# Patient Record
Sex: Female | Born: 1990 | Race: White | Hispanic: No | Marital: Single | Smoking: Never smoker
Health system: Southern US, Community
[De-identification: ages and names within clinical notes are randomized; demographics above are authoritative.]

## PROBLEM LIST (undated history)

## (undated) DIAGNOSIS — F419 Anxiety disorder, unspecified: Secondary | ICD-10-CM

## (undated) DIAGNOSIS — N926 Irregular menstruation, unspecified: Secondary | ICD-10-CM

## (undated) DIAGNOSIS — F319 Bipolar disorder, unspecified: Secondary | ICD-10-CM

## (undated) DIAGNOSIS — F329 Major depressive disorder, single episode, unspecified: Secondary | ICD-10-CM

## (undated) DIAGNOSIS — F32A Depression, unspecified: Secondary | ICD-10-CM

## (undated) HISTORY — DX: Irregular menstruation, unspecified: N92.6

## (undated) HISTORY — DX: Bipolar disorder, unspecified: F31.9

---
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
## 2008-10-06 ENCOUNTER — Ambulatory Visit: Payer: Self-pay | Admitting: Women's Health

## 2009-01-02 HISTORY — PX: KNEE SURGERY: SHX244

## 2009-01-13 ENCOUNTER — Ambulatory Visit: Payer: Self-pay | Admitting: Gynecology

## 2009-02-02 ENCOUNTER — Ambulatory Visit: Payer: Self-pay | Admitting: Vascular Surgery

## 2009-10-11 ENCOUNTER — Other Ambulatory Visit: Admission: RE | Admit: 2009-10-11 | Discharge: 2009-10-11 | Payer: Self-pay | Admitting: Gynecology

## 2009-10-11 ENCOUNTER — Ambulatory Visit: Payer: Self-pay | Admitting: Women's Health

## 2009-11-24 ENCOUNTER — Ambulatory Visit: Payer: Self-pay | Admitting: Women's Health

## 2009-11-29 ENCOUNTER — Ambulatory Visit: Payer: Self-pay | Admitting: Women's Health

## 2010-02-16 ENCOUNTER — Ambulatory Visit: Payer: Self-pay | Admitting: Women's Health

## 2010-05-16 ENCOUNTER — Ambulatory Visit (INDEPENDENT_AMBULATORY_CARE_PROVIDER_SITE_OTHER): Payer: BC Managed Care – PPO

## 2010-05-16 DIAGNOSIS — Z3049 Encounter for surveillance of other contraceptives: Secondary | ICD-10-CM

## 2010-07-17 NOTE — Procedures (Signed)
DUPLEX DEEP VENOUS EXAM - LOWER EXTREMITY   INDICATION:  Right lower extremity pain.   HISTORY:  Edema:  Yes.  Trauma/Surgery:  Yes.  Pain:  Yes.  PE:  No.  Previous DVT:  No.  Anticoagulants:  None.  Other:   DUPLEX EXAM:                CFV   SFV   PopV  PTV    GSV                R  L  R  L  R  L  R   L  R  L  Thrombosis    o  o  o     o     o      o  Spontaneous   +  +  +     +     +      +  Phasic        +  +  +     +     +      +  Augmentation  +  +  +     +     +      +  Compressible  +  +  +     +     +      +  Competent     +  +  +     +     +      +   Legend:  + - yes  o - no  p - partial  D - decreased   IMPRESSION:  No evidence of deep venous thrombosis noted in the right  leg.   Notified Jacki Cones with results.    _____________________________  Di Kindle. Edilia Bo, M.D.   MG/MEDQ  D:  02/02/2009  T:  02/02/2009  Job:  [id number]

## 2010-08-09 ENCOUNTER — Ambulatory Visit (INDEPENDENT_AMBULATORY_CARE_PROVIDER_SITE_OTHER): Payer: BC Managed Care – PPO

## 2010-08-09 DIAGNOSIS — Z3049 Encounter for surveillance of other contraceptives: Secondary | ICD-10-CM

## 2010-10-18 DIAGNOSIS — N926 Irregular menstruation, unspecified: Secondary | ICD-10-CM | POA: Insufficient documentation

## 2010-10-26 ENCOUNTER — Encounter: Payer: Self-pay | Admitting: Women's Health

## 2010-10-26 ENCOUNTER — Ambulatory Visit (INDEPENDENT_AMBULATORY_CARE_PROVIDER_SITE_OTHER): Payer: BC Managed Care – PPO | Admitting: Women's Health

## 2010-10-26 ENCOUNTER — Other Ambulatory Visit (HOSPITAL_COMMUNITY)
Admission: RE | Admit: 2010-10-26 | Discharge: 2010-10-26 | Disposition: A | Discharge status: Home / Self Care | Payer: BC Managed Care – PPO | Source: Ambulatory Visit | Attending: Gynecology | Admitting: Gynecology

## 2010-10-26 VITALS — BP 120/70 | Ht 67.5 in | Wt 193.0 lb

## 2010-10-26 DIAGNOSIS — Z01419 Encounter for gynecological examination (general) (routine) without abnormal findings: Secondary | ICD-10-CM

## 2010-10-26 DIAGNOSIS — Z309 Encounter for contraceptive management, unspecified: Secondary | ICD-10-CM

## 2010-10-26 DIAGNOSIS — Z113 Encounter for screening for infections with a predominantly sexual mode of transmission: Secondary | ICD-10-CM

## 2010-10-26 MED ORDER — NORGESTIM-ETH ESTRAD TRIPHASIC 0.18/0.215/0.25 MG-35 MCG PO TABS: 1.0000 | ORAL_TABLET | Freq: Every day | ORAL | Status: DC

## 2010-10-26 NOTE — Progress Notes (Signed)
Tara Sullivan 1991/01/05 [id number]    History:    The patient presents for annual exam.    Past medical history, past surgical history, family history and social history were all reviewed and documented in the EPIC chart.   ROS:  A  ROS was performed and pertinent positives and negatives are included in the history.  Exam:  Filed Vitals:   10/26/10 0914  BP: 120/70    General appearance:  Normal Head/Neck:  Normal, without cervical or supraclavicular adenopathy. Thyroid:  Symmetrical, normal in size, without palpable masses or nodularity. Respiratory  Effort:  Normal  Auscultation:  Clear without wheezing or rhonchi Cardiovascular  Auscultation:  Regular rate, without rubs, murmurs or gallops  Edema/varicosities:  Not grossly evident Abdominal  Soft,nontender, without masses, guarding or rebound.  Liver/spleen:  No organomegaly noted  Hernia:  None appreciated  Skin  Inspection:  Grossly normal  Palpation:  Grossly normal Neurologic/psychiatric  Orientation:  Normal with appropriate conversation.  Mood/affect:  Normal  Genitourinary    Breasts: Examined lying and sitting.     Right: Without masses, retractions, discharge or axillary adenopathy.     Left: Without masses, retractions, discharge or axillary adenopathy.   Inguinal/mons:  Normal without inguinal adenopathy  External genitalia:  Normal  BUS/Urethra/Skene's glands:  Normal  Bladder:  Normal  Vagina:  Normal  Cervix:  Normal  Uterus:  normal in size, shape and contour.  Midline and mobile  Adnexa/parametria:     Rt: Without masses or tenderness.   Lt: Without masses or tenderness.  Anus and perineum: Normal  Digital rectal exam: Normal sphincter tone without palpated masses or tenderness  Assessment/Plan:  20 y.o.SWF G0  for annual exam.   Presents for an annual exam being on DepoProvera for 1 year, amenorrhic. She has gained 40 pounds, states is hungry all the time which she accounts for her weight  gain. Other options were reviewed. Shes been on pills without a problem except for forgetting, did review ways to remember and would like to start back on pills. Will start back on today when she picks up her prescription because her depo was due this week. Did review importance of condoms for infection control and first month back on. Did review slight risk for blood clots and strokes Weight Watchers encouraged for weight , increasing her exercises well. SBEs, MVI, healthy diet encouraged, driving and campus safety reviewed. CBC, UA, Pap, GC/ Chlamydia, declines need for , hepatitis and RPR.Marland Kitchen  Has had some lower abdominal  Pain, intermittent, did review if this is not decrease or resolve to return to the office for an ultrasound. States it has only been for a few days. Denies any constipation or UTI symptoms, denies discharge.    Harrington [facility], 9:42 AM 10/26/2010

## 2010-12-21 ENCOUNTER — Other Ambulatory Visit: Payer: Self-pay | Admitting: *Deleted

## 2010-12-21 MED ORDER — NORGESTIM-ETH ESTRAD TRIPHASIC 0.18/0.215/0.25 MG-35 MCG PO TABS: 1.0000 | ORAL_TABLET | Freq: Every day | ORAL | Status: DC

## 2010-12-26 MED ORDER — NORGESTIM-ETH ESTRAD TRIPHASIC 0.18/0.215/0.25 MG-35 MCG PO TABS: 1.0000 | ORAL_TABLET | Freq: Every day | ORAL | Status: DC

## 2010-12-26 NOTE — Telephone Encounter (Signed)
Addended byMckinley Jewel, Richrd Kuzniar L on: 12/26/2010 11:13 AM   Modules accepted: Orders

## 2011-06-11 ENCOUNTER — Other Ambulatory Visit: Payer: Self-pay | Admitting: Gynecology

## 2011-06-11 ENCOUNTER — Encounter: Payer: Self-pay | Admitting: Gynecology

## 2011-06-11 ENCOUNTER — Ambulatory Visit (INDEPENDENT_AMBULATORY_CARE_PROVIDER_SITE_OTHER): Payer: BC Managed Care – PPO | Admitting: Gynecology

## 2011-06-11 VITALS — BP 118/78

## 2011-06-11 DIAGNOSIS — R300 Dysuria: Secondary | ICD-10-CM

## 2011-06-11 DIAGNOSIS — N898 Other specified noninflammatory disorders of vagina: Secondary | ICD-10-CM

## 2011-06-11 DIAGNOSIS — N390 Urinary tract infection, site not specified: Secondary | ICD-10-CM

## 2011-06-11 LAB — URINALYSIS W MICROSCOPIC + REFLEX CULTURE
Glucose, UA: 100 mg/dL — AB
Nitrite: POSITIVE — AB
Protein, ur: 30 mg/dL — AB
Urobilinogen, UA: 2 mg/dL — ABNORMAL HIGH (ref 0.0–1.0)

## 2011-06-11 LAB — WET PREP FOR TRICH, YEAST, CLUE
Clue Cells Wet Prep HPF POC: NONE SEEN
Trich, Wet Prep: NONE SEEN

## 2011-06-11 MED ORDER — NITROFURANTOIN MONOHYD MACRO 100 MG PO CAPS: 100.0000 mg | ORAL_CAPSULE | Freq: Two times a day (BID) | ORAL | Status: DC

## 2011-06-11 NOTE — Progress Notes (Signed)
Patient presented to the office today complaining of 2 days of dysuria and frequency and this past Sunday had noted some blood in her urine. She tried yesterday over-the-counter Azo with minimal relief of her symptoms. She felt a little warm but no temperature reading reported some mild nausea one day but no chills or vomiting. She is on the oral contraceptive pills and is having normal menstrual cycles. No past history of any kidney stones and last urinary tract infection was a few years ago.  Exam: Abdomen soft  Tender suprapubically Pelvic: Bartholin urethra Skene was within normal limits Vagina: No lesions or discharge Cervix: No lesion discharge uterus: Anteverted normal size shape and consistency tender suprapubically Adnexa: No palpable masses or tenderness Rectal: Not examined  Urinalysis: 3-6 WBC, 36 RBC, few bacteria. Glucose 100 mg/dl  Assessment/plan: Signs and symptoms consistent with urinary tract infection. Urine culture will be started but meanwhile we'll start patient on Macrobid one by mouth twice a day for 7 days. She will be prescribed Uribell as an antispasmodic agent and she will take one 3-4 times a day for the next 2-3 days. She was instructed to increase her fluid intake as well. If she has any flank pain or fever she should report to the office immediately. I would like her to return to the office after completion of the above treatment so we can check her urine and make sure she still no spilling sugar. Of note the wet prep demonstrated no abnormalities.

## 2011-06-11 NOTE — Patient Instructions (Signed)

## 2011-06-12 ENCOUNTER — Telehealth: Payer: Self-pay | Admitting: *Deleted

## 2011-06-12 DIAGNOSIS — N390 Urinary tract infection, site not specified: Secondary | ICD-10-CM

## 2011-06-12 DIAGNOSIS — R300 Dysuria: Secondary | ICD-10-CM

## 2011-06-12 MED ORDER — NITROFURANTOIN MONOHYD MACRO 100 MG PO CAPS: 100.0000 mg | ORAL_CAPSULE | Freq: Two times a day (BID) | ORAL | Status: AC

## 2011-06-12 NOTE — Telephone Encounter (Signed)
Message copied by Aura Camps on Wed Jun 12, 2011 11:18 AM ------      Message from: Ok Edwards      Created: Tue Jun 11, 2011  5:20 PM       Victorino Dike, please call patient and tell her that after she finishes the antibiotic treatment I want her to come by the office a week and repeat her urinalysis not only to see that her infection has been cleared but also to make sure she does not continue to spill sugar as was noted on this recent urinalysis.

## 2011-06-12 NOTE — Telephone Encounter (Signed)
Pt informed with the below note, order place in computer, pt also said that pharmacy never got the macrobid rx, this will be sent as well.

## 2011-06-13 LAB — URINE CULTURE: Organism ID, Bacteria: NO GROWTH

## 2011-11-13 ENCOUNTER — Ambulatory Visit (INDEPENDENT_AMBULATORY_CARE_PROVIDER_SITE_OTHER): Payer: BC Managed Care – PPO | Admitting: Women's Health

## 2011-11-13 ENCOUNTER — Encounter: Payer: Self-pay | Admitting: Women's Health

## 2011-11-13 VITALS — BP 126/74 | Ht 67.0 in | Wt 201.0 lb

## 2011-11-13 DIAGNOSIS — Z309 Encounter for contraceptive management, unspecified: Secondary | ICD-10-CM

## 2011-11-13 DIAGNOSIS — Z113 Encounter for screening for infections with a predominantly sexual mode of transmission: Secondary | ICD-10-CM

## 2011-11-13 DIAGNOSIS — Z8619 Personal history of other infectious and parasitic diseases: Secondary | ICD-10-CM | POA: Insufficient documentation

## 2011-11-13 DIAGNOSIS — A64 Unspecified sexually transmitted disease: Secondary | ICD-10-CM

## 2011-11-13 DIAGNOSIS — Z01419 Encounter for gynecological examination (general) (routine) without abnormal findings: Secondary | ICD-10-CM

## 2011-11-13 DIAGNOSIS — IMO0001 Reserved for inherently not codable concepts without codable children: Secondary | ICD-10-CM

## 2011-11-13 LAB — CBC WITH DIFFERENTIAL/PLATELET
Eosinophils Absolute: 0.1 10*3/uL (ref 0.0–0.7)
Eosinophils Relative: 1 % (ref 0–5)
Lymphs Abs: 2.3 10*3/uL (ref 0.7–4.0)
MCH: 29.9 pg (ref 26.0–34.0)
MCHC: 33.2 g/dL (ref 30.0–36.0)
MCV: 90.2 fL (ref 78.0–100.0)
Monocytes Relative: 9 % (ref 3–12)
Platelets: 260 10*3/uL (ref 150–400)
RBC: 4.41 MIL/uL (ref 3.87–5.11)

## 2011-11-13 MED ORDER — NORGESTIMATE-ETH ESTRADIOL 0.25-35 MG-MCG PO TABS: 1.0000 | ORAL_TABLET | Freq: Every day | ORAL | Status: DC

## 2011-11-13 NOTE — Progress Notes (Signed)
Tara Sullivan 1991/01/16 [id number]    History:    The patient presents for annual exam.  On Ortho Tri-Cyclen, spotting first few days of placebo week then normal flow for several days. Questionable exposure to an STD. History of Chlamydia 2009 in 2010/treated. Gardasil series completed in 2007. History of normal Paps.  Past medical history, past surgical history, family history and social history were all reviewed and documented in the EPIC chart. Works at [employer].   ROS:  A  ROS was performed and pertinent positives and negatives are included in the history.  Exam:  Filed Vitals:   11/13/11 0928  BP: 126/74    General appearance:  Normal Head/Neck:  Normal, without cervical or supraclavicular adenopathy. Thyroid:  Symmetrical, normal in size, without palpable masses or nodularity. Respiratory  Effort:  Normal  Auscultation:  Clear without wheezing or rhonchi Cardiovascular  Auscultation:  Regular rate, without rubs, murmurs or gallops  Edema/varicosities:  Not grossly evident Abdominal  Soft,nontender, without masses, guarding or rebound.  Liver/spleen:  No organomegaly noted  Hernia:  None appreciated  Skin  Inspection:  Grossly normal  Palpation:  Grossly normal Neurologic/psychiatric  Orientation:  Normal with appropriate conversation.  Mood/affect:  Normal  Genitourinary    Breasts: Examined lying and sitting.     Right: Without masses, retractions, discharge or axillary adenopathy.     Left: Without masses, retractions, discharge or axillary adenopathy.   Inguinal/mons:  Normal without inguinal adenopathy  External genitalia:  Normal  BUS/Urethra/Skene's glands:  Normal  Bladder:  Normal  Vagina:  Normal  Cervix:  Normal  Uterus:   normal in size, shape and contour.  Midline and mobile  Adnexa/parametria:     Rt: Without masses or tenderness.   Lt: Without masses or tenderness.  Anus and  perineum: Normal    Assessment/Plan:  21 y.o. S WF G0 for annual exam with no complaints.  Normal GYN exam STD screen Obesity  Plan: Will try Ortho-Cyclen, monophasic to hopefully shortened cycle. Instructed to call if cycle continues to last 6 days. Condoms encouraged until permanent partner. SBE's, exercise, calcium rich diet, MVI daily, campus safety reviewed. Reviewed importance of decreasing calories and increasing exercise for weight loss, Weight Watchers reviewed. CBC, UA, GC/Chlamydia, HIV, hep B, C., RPR. No Pap history of normal Paps, new screening guidelines reviewed.    Harrington Challenger [facility], 12:07 PM 11/13/2011

## 2011-11-13 NOTE — Patient Instructions (Addendum)

## 2011-11-14 LAB — RPR

## 2012-01-01 ENCOUNTER — Telehealth: Payer: Self-pay | Admitting: Women's Health

## 2012-01-01 ENCOUNTER — Other Ambulatory Visit: Payer: Self-pay | Admitting: Women's Health

## 2012-01-01 DIAGNOSIS — IMO0001 Reserved for inherently not codable concepts without codable children: Secondary | ICD-10-CM

## 2012-01-01 MED ORDER — NORGESTIMATE-ETH ESTRADIOL 0.25-35 MG-MCG PO TABS: 1.0000 | ORAL_TABLET | Freq: Every day | ORAL | Status: DC

## 2012-01-01 NOTE — Telephone Encounter (Signed)
I called patient because her pharmacy sent refill request for Ortho Tri Cyclen.  However, at last office visit [place] discussed with her switching to OrthoCyclen to improve her cycle.  Patient said she did want to start the OrthoCyclen.  We realized [place] sent the [facility] to [facility] and patient actually has mail order. I redirected the OrthoCyclen to [facility] and denied the refill on the Ortho Tri Cyclen.

## 2012-03-13 ENCOUNTER — Other Ambulatory Visit: Payer: Self-pay | Admitting: Family Medicine

## 2012-03-13 DIAGNOSIS — R51 Headache: Secondary | ICD-10-CM

## 2012-03-16 ENCOUNTER — Ambulatory Visit
Admission: RE | Admit: 2012-03-16 | Discharge: 2012-03-16 | Disposition: A | Discharge status: Home / Self Care | Payer: BC Managed Care – PPO | Source: Ambulatory Visit | Attending: Family Medicine | Admitting: Family Medicine

## 2012-03-16 DIAGNOSIS — R51 Headache: Secondary | ICD-10-CM

## 2012-05-04 ENCOUNTER — Other Ambulatory Visit: Payer: Self-pay

## 2012-05-04 DIAGNOSIS — IMO0001 Reserved for inherently not codable concepts without codable children: Secondary | ICD-10-CM

## 2012-05-04 MED ORDER — NORGESTIMATE-ETH ESTRADIOL 0.25-35 MG-MCG PO TABS: 1.0000 | ORAL_TABLET | Freq: Every day | ORAL | Status: DC

## 2012-10-04 ENCOUNTER — Other Ambulatory Visit: Payer: Self-pay | Admitting: Women's Health

## 2012-10-05 NOTE — Telephone Encounter (Signed)
Has CE scheduled in September 2014.

## 2012-11-13 ENCOUNTER — Other Ambulatory Visit (HOSPITAL_COMMUNITY)
Admission: RE | Admit: 2012-11-13 | Discharge: 2012-11-13 | Disposition: A | Discharge status: Home / Self Care | Payer: BC Managed Care – PPO | Source: Ambulatory Visit | Attending: Gynecology | Admitting: Gynecology

## 2012-11-13 ENCOUNTER — Encounter: Payer: Self-pay | Admitting: Women's Health

## 2012-11-13 ENCOUNTER — Ambulatory Visit (INDEPENDENT_AMBULATORY_CARE_PROVIDER_SITE_OTHER): Payer: BC Managed Care – PPO | Admitting: Women's Health

## 2012-11-13 VITALS — BP 116/74 | Ht 67.5 in | Wt 196.2 lb

## 2012-11-13 DIAGNOSIS — Z113 Encounter for screening for infections with a predominantly sexual mode of transmission: Secondary | ICD-10-CM

## 2012-11-13 DIAGNOSIS — Z01419 Encounter for gynecological examination (general) (routine) without abnormal findings: Secondary | ICD-10-CM

## 2012-11-13 DIAGNOSIS — Z309 Encounter for contraceptive management, unspecified: Secondary | ICD-10-CM

## 2012-11-13 DIAGNOSIS — IMO0001 Reserved for inherently not codable concepts without codable children: Secondary | ICD-10-CM

## 2012-11-13 LAB — CBC WITH DIFFERENTIAL/PLATELET
Basophils Absolute: 0 10*3/uL (ref 0.0–0.1)
Basophils Relative: 0 % (ref 0–1)
HCT: 37.9 % (ref 36.0–46.0)
MCHC: 34 g/dL (ref 30.0–36.0)
Monocytes Absolute: 0.7 10*3/uL (ref 0.1–1.0)
Neutro Abs: 4.5 10*3/uL (ref 1.7–7.7)
Platelets: 285 10*3/uL (ref 150–400)
RDW: 12.5 % (ref 11.5–15.5)
WBC: 7.1 10*3/uL (ref 4.0–10.5)

## 2012-11-13 LAB — HEPATITIS C ANTIBODY: HCV Ab: NEGATIVE

## 2012-11-13 LAB — HEPATITIS B SURFACE ANTIGEN: Hepatitis B Surface Ag: NEGATIVE

## 2012-11-13 LAB — RPR

## 2012-11-13 MED ORDER — ETONOGESTREL-ETHINYL ESTRADIOL 0.12-0.015 MG/24HR VA RING: VAGINAL_RING | VAGINAL | Status: DC

## 2012-11-13 NOTE — Patient Instructions (Signed)

## 2012-11-13 NOTE — Progress Notes (Signed)
Tara Sullivan [birth year] [id number]    History:    The patient presents for annual exam with concern about dysmenorrhea/menorrhagia. Describes periods as lasting 1 week and heavy for 4 days. Also describes dyspareunia with certain positions that causes light spotting. Sprintec/new partner, normal PAP history. History of Chlamydia in 2009/20010.     Past medical history, past surgical history, family history and social history were all reviewed and documented in the EPIC chart. MGF HTN DM, MGM HTN. Works at [employer], thinking about nursing school, lives with parents. Been with current partner 1 year.    ROS:  A  ROS was performed and pertinent positives and negatives are included in the history.  Exam:  Filed Vitals:   11/13/12 1007  BP: 116/74    General appearance:  Normal Head/Neck:  Normal, without cervical or supraclavicular adenopathy. Thyroid:  Symmetrical, normal in size, without palpable masses or nodularity. Respiratory  Effort:  Normal  Auscultation:  Clear without wheezing or rhonchi Cardiovascular  Auscultation:  Regular rate, without rubs, murmurs or gallops  Edema/varicosities:  Not grossly evident Abdominal  Soft,nontender, without masses, guarding or rebound.  Liver/spleen:  No organomegaly noted  Hernia:  None appreciated  Skin  Inspection:  Grossly normal  Palpation:  Grossly normal Neurologic/psychiatric  Orientation:  Normal with appropriate conversation.  Mood/affect:  Normal  Genitourinary    Breasts: Examined lying and sitting.     Right: Without masses, retractions, discharge or axillary adenopathy.     Left: Without masses, retractions, discharge or axillary adenopathy.   Inguinal/mons:  Normal without inguinal adenopathy  External genitalia:  Normal  BUS/Urethra/Skene's glands:  Normal  Bladder:  Normal  Vagina:  Normal  Cervix:  Normal  Uterus:  normal in size, shape and contour.  Midline and mobile  Adnexa/parametria:      Rt: Without masses or tenderness.   Lt: Without masses or tenderness.  Anus and perineum: Normal  Digital rectal exam: Normal sphincter tone without palpated masses or tenderness  Assessment/Plan:  22 y.o.  SWF, G0 for annual exam.    Normal GYN exam STI screening Contraception Management Obesity  Plan: Contraception options reviewed, nuvaring prescription, proper use given and reviewed slight risk for blood clots and strokes, instructed to call if no relief of dysmenorrhea and menorrhagia. Encouraged to avoid certain positions that caused painful intercourse., PAP, normal 2012, new screening guidelines reviewed. GC/Chlamydia, HIV, hep B, C., RPR. UA, CBC, encouraged watching calorie intake and exercise for weight loss, SBEs, MVI daily encouraged.     Harrington Challenger [facility], 10:41 AM 11/13/2012

## 2012-11-14 LAB — URINALYSIS W MICROSCOPIC + REFLEX CULTURE
Bilirubin Urine: NEGATIVE
Crystals: NONE SEEN
Leukocytes, UA: NEGATIVE
Nitrite: NEGATIVE
Protein, ur: NEGATIVE mg/dL
Specific Gravity, Urine: 1.019 (ref 1.005–1.030)
Squamous Epithelial / LPF: NONE SEEN
Urobilinogen, UA: 0.2 mg/dL (ref 0.0–1.0)

## 2012-11-15 LAB — GC/CHLAMYDIA PROBE AMP: GC Probe RNA: NEGATIVE

## 2012-11-17 ENCOUNTER — Other Ambulatory Visit: Payer: Self-pay | Admitting: Women's Health

## 2012-11-17 MED ORDER — AZITHROMYCIN 250 MG PO TABS: 500.0000 mg | ORAL_TABLET | Freq: Once | ORAL | Status: DC

## 2012-11-17 MED ORDER — AZITHROMYCIN 500 MG PO TABS: 1000.0000 mg | ORAL_TABLET | Freq: Once | ORAL | Status: DC

## 2012-12-08 ENCOUNTER — Ambulatory Visit (INDEPENDENT_AMBULATORY_CARE_PROVIDER_SITE_OTHER): Payer: BC Managed Care – PPO | Admitting: Women's Health

## 2012-12-08 ENCOUNTER — Encounter: Payer: Self-pay | Admitting: Women's Health

## 2012-12-08 DIAGNOSIS — A64 Unspecified sexually transmitted disease: Secondary | ICD-10-CM

## 2012-12-08 DIAGNOSIS — Z113 Encounter for screening for infections with a predominantly sexual mode of transmission: Secondary | ICD-10-CM

## 2012-12-08 NOTE — Patient Instructions (Addendum)

## 2012-12-08 NOTE — Progress Notes (Signed)
Patient ID: Tara Sullivan, female   DOB: 1991/02/04, 22 y.o.   MRN: [id number]  Presents for TOC, positive for asymptomatic chlamydia on annual exam, negative HIV, RPR hep b,c.  Treated with azithromycin, partner  treated and has abstained.    Exam:  Speculum exam no disharge, erythema, GC/Chylamydia culture taken.  Plan: GC/chlamydia culture pending.  Continue NuvaRing and condoms.

## 2012-12-09 LAB — GC/CHLAMYDIA PROBE AMP: CT Probe RNA: NEGATIVE

## 2013-04-01 ENCOUNTER — Ambulatory Visit (INDEPENDENT_AMBULATORY_CARE_PROVIDER_SITE_OTHER): Payer: BC Managed Care – PPO | Admitting: Women's Health

## 2013-04-01 ENCOUNTER — Encounter: Payer: Self-pay | Admitting: Women's Health

## 2013-04-01 ENCOUNTER — Other Ambulatory Visit: Payer: Self-pay | Admitting: Women's Health

## 2013-04-01 DIAGNOSIS — Z113 Encounter for screening for infections with a predominantly sexual mode of transmission: Secondary | ICD-10-CM

## 2013-04-01 DIAGNOSIS — N390 Urinary tract infection, site not specified: Secondary | ICD-10-CM

## 2013-04-01 DIAGNOSIS — R300 Dysuria: Secondary | ICD-10-CM

## 2013-04-01 DIAGNOSIS — N898 Other specified noninflammatory disorders of vagina: Secondary | ICD-10-CM

## 2013-04-01 LAB — URINALYSIS W MICROSCOPIC + REFLEX CULTURE
Bilirubin Urine: NEGATIVE
Crystals: NONE SEEN
Glucose, UA: NEGATIVE mg/dL
Ketones, ur: NEGATIVE mg/dL
NITRITE: POSITIVE — AB
PH: 5.5 (ref 5.0–8.0)
Protein, ur: NEGATIVE mg/dL
SPECIFIC GRAVITY, URINE: 1.025 (ref 1.005–1.030)
UROBILINOGEN UA: 0.2 mg/dL (ref 0.0–1.0)

## 2013-04-01 LAB — WET PREP FOR TRICH, YEAST, CLUE
CLUE CELLS WET PREP: NONE SEEN
Trich, Wet Prep: NONE SEEN
YEAST WET PREP: NONE SEEN

## 2013-04-01 MED ORDER — SULFAMETHOXAZOLE-TMP DS 800-160 MG PO TABS: 1.0000 | ORAL_TABLET | Freq: Two times a day (BID) | ORAL | Status: DC

## 2013-04-01 NOTE — Progress Notes (Signed)
Patient ID: Tara Sullivan, female   DOB: 08/08/1990, 23 y.o.   MRN: [id number] Presents with complaint of increased urinary frequency, pain at end of stream of urination for 1 week at symptoms are progressing. Denies vaginal discharge, abdominal pain or fever. Having low back pain. Had unprotected intercourse with new partner. Contraceptives with NuvaRing.   Exam: Appears well. No CVAT pain is more in the sacral area. External genitalia within normal limits, speculum exam scant discharge, wet prep negative. GC/Chlamydia culture taken. Bimanual no CMT or adnexal fullness or tenderness. UA: Positive nitrites, small leukocytes, TNTC wbc's, many bacteria.  UTI STD screen  Plan: Septra twice daily for 3 days, prescription, proper use given and reviewed. Instructed to call if no relief of symptoms. UTI prevention discussed. GC/Chlamydia culture pending. Declines HIV, hepatitis or RPR. Reviewed importance of condoms if sexually active.

## 2013-04-01 NOTE — Patient Instructions (Signed)
Urinary Tract Infection  Urinary tract infections (UTIs) can develop anywhere along your urinary tract. Your urinary tract is your body's drainage system for removing wastes and extra water. Your urinary tract includes two kidneys, two ureters, a bladder, and a urethra. Your kidneys are a pair of bean-shaped organs. Each kidney is about the size of your fist. They are located below your ribs, one on each side of your spine.  CAUSES  Infections are caused by microbes, which are microscopic organisms, including fungi, viruses, and bacteria. These organisms are so small that they can only be seen through a microscope. Bacteria are the microbes that most commonly cause UTIs.  SYMPTOMS   Symptoms of UTIs may vary by age and gender of the patient and by the location of the infection. Symptoms in young women typically include a frequent and intense urge to urinate and a painful, burning feeling in the bladder or urethra during urination. Older women and men are more likely to be tired, shaky, and weak and have muscle aches and abdominal pain. A fever may mean the infection is in your kidneys. Other symptoms of a kidney infection include pain in your back or sides below the ribs, nausea, and vomiting.  DIAGNOSIS  To diagnose a UTI, your caregiver will ask you about your symptoms. Your caregiver also will ask to provide a urine sample. The urine sample will be tested for bacteria and white blood cells. White blood cells are made by your body to help fight infection.  TREATMENT   Typically, UTIs can be treated with medication. Because most UTIs are caused by a bacterial infection, they usually can be treated with the use of antibiotics. The choice of antibiotic and length of treatment depend on your symptoms and the type of bacteria causing your infection.  HOME CARE INSTRUCTIONS   If you were prescribed antibiotics, take them exactly as your caregiver instructs you. Finish the medication even if you feel better after you  have only taken some of the medication.   Drink enough water and fluids to keep your urine clear or pale yellow.   Avoid caffeine, tea, and carbonated beverages. They tend to irritate your bladder.   Empty your bladder often. Avoid holding urine for long periods of time.   Empty your bladder before and after sexual intercourse.   After a bowel movement, women should cleanse from front to back. Use each tissue only once.  SEEK MEDICAL CARE IF:    You have back pain.   You develop a fever.   Your symptoms do not begin to resolve within 3 days.  SEEK IMMEDIATE MEDICAL CARE IF:    You have severe back pain or lower abdominal pain.   You develop chills.   You have nausea or vomiting.   You have continued burning or discomfort with urination.  MAKE SURE YOU:    Understand these instructions.   Will watch your condition.   Will get help right away if you are not doing well or get worse.  Document Released: 11/28/2004 Document Revised: 08/20/2011 Document Reviewed: 03/29/2011  ExitCare Patient Information 2014 [employer].

## 2013-04-02 ENCOUNTER — Other Ambulatory Visit: Payer: Self-pay | Admitting: Women's Health

## 2013-04-02 DIAGNOSIS — Z113 Encounter for screening for infections with a predominantly sexual mode of transmission: Secondary | ICD-10-CM

## 2013-04-02 LAB — GC/CHLAMYDIA PROBE AMP
CT PROBE, AMP APTIMA: POSITIVE — AB
GC PROBE AMP APTIMA: NEGATIVE

## 2013-04-02 MED ORDER — AZITHROMYCIN 500 MG PO TABS: 1000.0000 mg | ORAL_TABLET | Freq: Once | ORAL | Status: DC

## 2013-04-04 LAB — URINE CULTURE: Colony Count: >=100000

## 2013-04-20 ENCOUNTER — Ambulatory Visit: Payer: BC Managed Care – PPO | Admitting: Women's Health

## 2013-04-29 ENCOUNTER — Ambulatory Visit: Payer: BC Managed Care – PPO | Admitting: Women's Health

## 2013-05-07 ENCOUNTER — Ambulatory Visit (INDEPENDENT_AMBULATORY_CARE_PROVIDER_SITE_OTHER): Payer: BC Managed Care – PPO | Admitting: Women's Health

## 2013-05-07 ENCOUNTER — Encounter: Payer: Self-pay | Admitting: Women's Health

## 2013-05-07 DIAGNOSIS — Z113 Encounter for screening for infections with a predominantly sexual mode of transmission: Secondary | ICD-10-CM

## 2013-05-07 DIAGNOSIS — B373 Candidiasis of vulva and vagina: Secondary | ICD-10-CM

## 2013-05-07 DIAGNOSIS — A64 Unspecified sexually transmitted disease: Secondary | ICD-10-CM

## 2013-05-07 DIAGNOSIS — B3731 Acute candidiasis of vulva and vagina: Secondary | ICD-10-CM

## 2013-05-07 LAB — WET PREP FOR TRICH, YEAST, CLUE
CLUE CELLS WET PREP: NONE SEEN
Trich, Wet Prep: NONE SEEN
Yeast Wet Prep HPF POC: NONE SEEN

## 2013-05-07 MED ORDER — FLUCONAZOLE 150 MG PO TABS
150.0000 mg | ORAL_TABLET | Freq: Once | ORAL | Status: DC
Start: 2013-05-07 — End: 2013-10-27

## 2013-05-07 NOTE — Progress Notes (Signed)
Patient ID: Tara Sullivan, female   DOB: 06/30/1990, 23 y.o.   MRN: [id number] Presents for a test of cure Chlamydia. States has been sexually active since treatment. Having scant discharge with mild itching. Denies urinary symptoms, abdominal pain or fever. Monthly cycle on nuva ring.   Exam: Appears well. External genitalia within normal limits, speculum exam scant white curdy discharge noted wet prep negative. GC/Chlamydia culture taken.   Clinical yeast  STD screen/test of cure  Plan: Diflucan 150 by mouth x1 dose prescription, proper use given and reviewed. GC/Chlamydia culture pending. Reviewed importance of condoms. HIV, hep B, C., RPR.

## 2013-05-08 LAB — HIV ANTIBODY (ROUTINE TESTING W REFLEX): HIV: NONREACTIVE

## 2013-05-08 LAB — GC/CHLAMYDIA PROBE AMP
CT Probe RNA: NEGATIVE
GC Probe RNA: NEGATIVE

## 2013-05-08 LAB — RPR

## 2013-05-08 LAB — HEPATITIS C ANTIBODY: HCV Ab: NEGATIVE

## 2013-05-08 LAB — HEPATITIS B SURFACE ANTIGEN: Hepatitis B Surface Ag: NEGATIVE

## 2013-05-10 ENCOUNTER — Encounter: Payer: Self-pay | Admitting: Women's Health

## 2013-07-29 ENCOUNTER — Other Ambulatory Visit: Payer: Self-pay | Admitting: Gastroenterology

## 2013-10-27 ENCOUNTER — Encounter: Payer: Self-pay | Admitting: Women's Health

## 2013-10-27 ENCOUNTER — Ambulatory Visit (INDEPENDENT_AMBULATORY_CARE_PROVIDER_SITE_OTHER): Payer: BC Managed Care – PPO | Admitting: Women's Health

## 2013-10-27 DIAGNOSIS — B373 Candidiasis of vulva and vagina: Secondary | ICD-10-CM

## 2013-10-27 DIAGNOSIS — B9689 Other specified bacterial agents as the cause of diseases classified elsewhere: Secondary | ICD-10-CM

## 2013-10-27 DIAGNOSIS — Z113 Encounter for screening for infections with a predominantly sexual mode of transmission: Secondary | ICD-10-CM

## 2013-10-27 DIAGNOSIS — N760 Acute vaginitis: Secondary | ICD-10-CM

## 2013-10-27 DIAGNOSIS — A499 Bacterial infection, unspecified: Secondary | ICD-10-CM

## 2013-10-27 DIAGNOSIS — B3731 Acute candidiasis of vulva and vagina: Secondary | ICD-10-CM

## 2013-10-27 LAB — WET PREP FOR TRICH, YEAST, CLUE: TRICH WET PREP: NONE SEEN

## 2013-10-27 MED ORDER — METRONIDAZOLE 0.75 % VA GEL: VAGINAL | Status: DC

## 2013-10-27 MED ORDER — FLUCONAZOLE 150 MG PO TABS: 150.0000 mg | ORAL_TABLET | Freq: Once | ORAL | Status: DC

## 2013-10-27 NOTE — Patient Instructions (Signed)
Bacterial Vaginosis Bacterial vaginosis is an infection of the vagina. It happens when too many of certain germs (bacteria) grow in the vagina. HOME CARE  Take your medicine as told by your doctor.  Finish your medicine even if you start to feel better.  Do not have sex until you finish your medicine and are better.  Tell your sex partner that you have an infection. They should see their doctor for treatment.  Practice safe sex. Use condoms. Have only one sex partner. GET HELP IF:  You are not getting better after 3 days of treatment.  You have more grey fluid (discharge) coming from your vagina than before.  You have more pain than before.  You have a fever. MAKE SURE YOU:   Understand these instructions.  Will watch your condition.  Will get help right away if you are not doing well or get worse. Document Released: 11/28/2007 Document Revised: 12/09/2012 Document Reviewed: 09/30/2012 ExitCare Patient Information 2015 [employer]. This information is not intended to replace advice given to you by your health care provider. Make sure you discuss any questions you have with your health care provider.  

## 2013-10-27 NOTE — Progress Notes (Signed)
Patient ID: Tara Sullivan, female   DOB: 06/06/1990, 23 y.o.   MRN: [id number] Presents with complaint of bright red bleeding with intercourse x1. New partner. Contraceptives with nuva ring. Denies abdominal pain or urinary symptoms. Chlamydia infections 2009, 2010, 2014 and 03/2013 with negative test of cures.   Exam: Appears well. External genitalia within normal limits, speculum exam minimal erythema, GC/Chlamydia culture taken no visible blood, wet prep positive for yeast, clues, TNTC bacteria. Bimanual no CMT or adnexal fullness or tenderness.  Bacteria vaginosis Yeast STD screen  Plan: MetroGel vaginal cream 1 applicator at bedtime x5, alcohol precautions reviewed. Diflucan 150 times one dose prescription, proper use given and reviewed. GC/Chlamydia culture pending. Will check HIV, hep B. and C. and RPR at annual exam .

## 2013-10-28 LAB — GC/CHLAMYDIA PROBE AMP
CT Probe RNA: NEGATIVE
GC PROBE AMP APTIMA: NEGATIVE

## 2013-11-24 ENCOUNTER — Ambulatory Visit (INDEPENDENT_AMBULATORY_CARE_PROVIDER_SITE_OTHER): Payer: BC Managed Care – PPO | Admitting: Women's Health

## 2013-11-24 ENCOUNTER — Encounter: Payer: Self-pay | Admitting: Women's Health

## 2013-11-24 VITALS — BP 115/80 | Ht 67.0 in | Wt 195.0 lb

## 2013-11-24 DIAGNOSIS — R1013 Epigastric pain: Secondary | ICD-10-CM

## 2013-11-24 DIAGNOSIS — G8929 Other chronic pain: Secondary | ICD-10-CM

## 2013-11-24 DIAGNOSIS — Z3040 Encounter for surveillance of contraceptives, unspecified: Secondary | ICD-10-CM

## 2013-11-24 DIAGNOSIS — B3731 Acute candidiasis of vulva and vagina: Secondary | ICD-10-CM

## 2013-11-24 DIAGNOSIS — B373 Candidiasis of vulva and vagina: Secondary | ICD-10-CM

## 2013-11-24 DIAGNOSIS — Z01419 Encounter for gynecological examination (general) (routine) without abnormal findings: Secondary | ICD-10-CM

## 2013-11-24 DIAGNOSIS — N898 Other specified noninflammatory disorders of vagina: Secondary | ICD-10-CM

## 2013-11-24 LAB — CBC WITH DIFFERENTIAL/PLATELET
BASOS PCT: 1 % (ref 0–1)
Basophils Absolute: 0.1 10*3/uL (ref 0.0–0.1)
Eosinophils Absolute: 0.1 10*3/uL (ref 0.0–0.7)
Eosinophils Relative: 1 % (ref 0–5)
HCT: 39.6 % (ref 36.0–46.0)
Hemoglobin: 13.5 g/dL (ref 12.0–15.0)
LYMPHS ABS: 1.6 10*3/uL (ref 0.7–4.0)
Lymphocytes Relative: 25 % (ref 12–46)
MCH: 30.1 pg (ref 26.0–34.0)
MCHC: 34.1 g/dL (ref 30.0–36.0)
MCV: 88.2 fL (ref 78.0–100.0)
MONOS PCT: 10 % (ref 3–12)
Monocytes Absolute: 0.6 10*3/uL (ref 0.1–1.0)
NEUTROS ABS: 4 10*3/uL (ref 1.7–7.7)
Neutrophils Relative %: 63 % (ref 43–77)
Platelets: 270 10*3/uL (ref 150–400)
RBC: 4.49 MIL/uL (ref 3.87–5.11)
RDW: 12.9 % (ref 11.5–15.5)
WBC: 6.3 10*3/uL (ref 4.0–10.5)

## 2013-11-24 LAB — TSH: TSH: 0.692 u[IU]/mL (ref 0.350–4.500)

## 2013-11-24 LAB — WET PREP FOR TRICH, YEAST, CLUE
CLUE CELLS WET PREP: NONE SEEN
Trich, Wet Prep: NONE SEEN

## 2013-11-24 MED ORDER — FLUCONAZOLE 150 MG PO TABS: 150.0000 mg | ORAL_TABLET | Freq: Once | ORAL | Status: DC

## 2013-11-24 MED ORDER — ETONOGESTREL-ETHINYL ESTRADIOL 0.12-0.015 MG/24HR VA RING: VAGINAL_RING | VAGINAL | Status: DC

## 2013-11-24 MED ORDER — DOXYCYCLINE HYCLATE 100 MG PO TABS: 100.0000 mg | ORAL_TABLET | Freq: Two times a day (BID) | ORAL | Status: DC

## 2013-11-24 NOTE — Patient Instructions (Signed)

## 2013-11-24 NOTE — Progress Notes (Signed)
Tara Sullivan 09-Sep-1990 [id number]    History:    Presents for annual exam.  NuvaRing, uses continuously cycles every 2 months. Has had problems with low pelvic cramping, IBS type symptoms, spotting with intercourse on occasion. History of Chlamydia 2009, 2010, 2014, 2015 all with negative test of cure after. Negative STD screen 10/2013 with same partner. Gardasil series completed. Normal Pap history.  Past medical history, past surgical history, family history and social history were all reviewed and documented in the EPIC chart. 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  ROS:  A  12 point ROS was performed and pertinent positives and negatives are included.  Exam:  Filed Vitals:   11/24/13 1005  BP: 115/80    General appearance:  Normal Thyroid:  Symmetrical, normal in size, without palpable masses or nodularity. Respiratory  Auscultation:  Clear without wheezing or rhonchi Cardiovascular  Auscultation:  Regular rate, without rubs, murmurs or gallops  Edema/varicosities:  Not grossly evident Abdominal  Soft,nontender, without masses, guarding or rebound.  Liver/spleen:  No organomegaly noted  Hernia:  None appreciated  Skin  Inspection:  Grossly normal   Breasts: Examined lying and sitting.     Right: Without masses, retractions, discharge or axillary adenopathy.     Left: Without masses, retractions, discharge or axillary adenopathy. Gentitourinary   Inguinal/mons:  Normal without inguinal adenopathy  External genitalia:  Normal  BUS/Urethra/Skene's glands:  Normal  Vagina:  Normal, minimal discharge, wet prep positive for few yeast.  Cervix:  Normal cervix not friable.  Uterus:   normal in size, shape and contour.  Midline and mobile, tenderness  Adnexa/parametria:     Rt: Without masses mild  tenderness.   Lt: Without masses mild tenderness.  Anus and perineum: Normal   Assessment/Plan:  23 y.o. SWF G0 for annual exam.    Pelvic  pain versus IBS Contraception management Yeast vaginitis   Plan: Doxycycline 100 twice daily for 7 days, prescription, proper use given and reviewed. Diflucan 150 by mouth today and repeat after completing antibiotics. Keep scheduled followup with primary care to evaluate IBS. Instructed to call if persistent low pelvic cramping pain. Condoms encouraged until permanent partner. NuvaRing prescription, proper use, slight risk for blood clots and strokes reviewed. CBC, TSH, prolactin, UA. Pap normal 2014, new screening guidelines reviewed. GYN ultrasound will schedule after next  cycle.    Harrington Challenger [facility], 11:19 AM 11/24/2013

## 2013-12-10 ENCOUNTER — Ambulatory Visit (INDEPENDENT_AMBULATORY_CARE_PROVIDER_SITE_OTHER): Payer: BC Managed Care – PPO

## 2013-12-10 ENCOUNTER — Other Ambulatory Visit: Payer: Self-pay | Admitting: Women's Health

## 2013-12-10 ENCOUNTER — Encounter: Payer: Self-pay | Admitting: Women's Health

## 2013-12-10 ENCOUNTER — Ambulatory Visit (INDEPENDENT_AMBULATORY_CARE_PROVIDER_SITE_OTHER): Payer: BC Managed Care – PPO | Admitting: Women's Health

## 2013-12-10 VITALS — BP 124/80 | Ht 67.0 in | Wt 194.0 lb

## 2013-12-10 DIAGNOSIS — N939 Abnormal uterine and vaginal bleeding, unspecified: Secondary | ICD-10-CM

## 2013-12-10 DIAGNOSIS — R1013 Epigastric pain: Principal | ICD-10-CM

## 2013-12-10 DIAGNOSIS — N83201 Unspecified ovarian cyst, right side: Secondary | ICD-10-CM

## 2013-12-10 DIAGNOSIS — N8320 Unspecified ovarian cysts: Secondary | ICD-10-CM

## 2013-12-10 DIAGNOSIS — N946 Dysmenorrhea, unspecified: Secondary | ICD-10-CM

## 2013-12-10 DIAGNOSIS — N930 Postcoital and contact bleeding: Secondary | ICD-10-CM

## 2013-12-10 DIAGNOSIS — G8929 Other chronic pain: Secondary | ICD-10-CM

## 2013-12-10 NOTE — Progress Notes (Signed)
Patient ID: Tara Sullivan, female   DOB: 09/24/1990, 23 y.o.   MRN: [id number] Presents for ultrasound. At annual exam complaint of intermittent low left side abdominal/ pelvic cramping especially with menstrual cycle and intercourse. History of Chlamydia x4 with negative test of cures, negative STD screening at annual exam, recently completed doxycycline twice daily for 7 days with no relief of cramping. Denies urinary symptoms, vaginal discharge and reports no sexual activity since annual exam. Contraceptives on NuvaRing. Reports no abdominal pain or cramping today.  Ultrasound: No uterine abnormalities seen.. Ovaries appear normal. 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 No free fluid noted. Transvaginal images.  Exam: Appears well.  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 Motrin as needed for discomfort, encouraged to abstain, condoms if sexually active.

## 2013-12-23 ENCOUNTER — Other Ambulatory Visit: Payer: Self-pay

## 2013-12-23 DIAGNOSIS — Z3040 Encounter for surveillance of contraceptives, unspecified: Secondary | ICD-10-CM

## 2013-12-23 MED ORDER — ETONOGESTREL-ETHINYL ESTRADIOL 0.12-0.015 MG/24HR VA RING: VAGINAL_RING | VAGINAL | Status: DC

## 2014-01-09 ENCOUNTER — Encounter (HOSPITAL_COMMUNITY): Payer: Self-pay | Admitting: Emergency Medicine

## 2014-01-09 ENCOUNTER — Emergency Department (HOSPITAL_COMMUNITY)
Admission: EM | Admit: 2014-01-09 | Discharge: 2014-01-09 | Discharge status: Against Medical Advice | Payer: BC Managed Care – PPO | Attending: Emergency Medicine | Admitting: Emergency Medicine

## 2014-01-09 ENCOUNTER — Observation Stay (HOSPITAL_COMMUNITY)
Admission: EM | Admit: 2014-01-09 | Discharge: 2014-01-12 | Disposition: A | Discharge status: Home / Self Care | Payer: BC Managed Care – PPO | Attending: Infectious Diseases | Admitting: Infectious Diseases

## 2014-01-09 DIAGNOSIS — F329 Major depressive disorder, single episode, unspecified: Secondary | ICD-10-CM | POA: Diagnosis not present

## 2014-01-09 DIAGNOSIS — T43222A Poisoning by selective serotonin reuptake inhibitors, intentional self-harm, initial encounter: Secondary | ICD-10-CM

## 2014-01-09 DIAGNOSIS — Z0000 Encounter for general adult medical examination without abnormal findings: Secondary | ICD-10-CM | POA: Insufficient documentation

## 2014-01-09 DIAGNOSIS — F332 Major depressive disorder, recurrent severe without psychotic features: Secondary | ICD-10-CM | POA: Diagnosis not present

## 2014-01-09 DIAGNOSIS — Z9189 Other specified personal risk factors, not elsewhere classified: Secondary | ICD-10-CM | POA: Diagnosis present

## 2014-01-09 DIAGNOSIS — T43202A Poisoning by unspecified antidepressants, intentional self-harm, initial encounter: Secondary | ICD-10-CM | POA: Diagnosis present

## 2014-01-09 DIAGNOSIS — K589 Irritable bowel syndrome without diarrhea: Secondary | ICD-10-CM | POA: Diagnosis not present

## 2014-01-09 DIAGNOSIS — F411 Generalized anxiety disorder: Secondary | ICD-10-CM

## 2014-01-09 DIAGNOSIS — F32A Depression, unspecified: Secondary | ICD-10-CM

## 2014-01-09 HISTORY — DX: Depression, unspecified: F32.A

## 2014-01-09 HISTORY — DX: Major depressive disorder, single episode, unspecified: F32.9

## 2014-01-09 HISTORY — DX: Anxiety disorder, unspecified: F41.9

## 2014-01-09 LAB — COMPREHENSIVE METABOLIC PANEL
ALBUMIN: 3.9 g/dL (ref 3.5–5.2)
ALT: 19 U/L (ref 0–35)
ANION GAP: 15 (ref 5–15)
AST: 18 U/L (ref 0–37)
Alkaline Phosphatase: 50 U/L (ref 39–117)
BUN: 9 mg/dL (ref 6–23)
CALCIUM: 9.2 mg/dL (ref 8.4–10.5)
CO2: 22 mEq/L (ref 19–32)
CREATININE: 0.69 mg/dL (ref 0.50–1.10)
Chloride: 103 mEq/L (ref 96–112)
GFR calc Af Amer: >90 mL/min (ref >90)
GFR calc non Af Amer: >90 mL/min (ref >90)
Glucose, Bld: 82 mg/dL (ref 70–99)
Potassium: 3.6 mEq/L — ABNORMAL LOW (ref 3.7–5.3)
Sodium: 140 mEq/L (ref 137–147)
TOTAL PROTEIN: 7.3 g/dL (ref 6.0–8.3)
Total Bilirubin: 0.3 mg/dL (ref 0.3–1.2)

## 2014-01-09 LAB — CBC
HEMATOCRIT: 40 % (ref 36.0–46.0)
Hemoglobin: 14.1 g/dL (ref 12.0–15.0)
MCH: 30.5 pg (ref 26.0–34.0)
MCHC: 35.3 g/dL (ref 30.0–36.0)
MCV: 86.4 fL (ref 78.0–100.0)
Platelets: 239 10*3/uL (ref 150–400)
RBC: 4.63 MIL/uL (ref 3.87–5.11)
RDW: 11.9 % (ref 11.5–15.5)
WBC: 8.4 10*3/uL (ref 4.0–10.5)

## 2014-01-09 LAB — RAPID URINE DRUG SCREEN, HOSP PERFORMED
Amphetamines: NOT DETECTED
Barbiturates: NOT DETECTED
Benzodiazepines: POSITIVE — AB
Cocaine: NOT DETECTED
Opiates: NOT DETECTED
Tetrahydrocannabinol: NOT DETECTED

## 2014-01-09 LAB — BASIC METABOLIC PANEL
Anion gap: 10 (ref 5–15)
BUN: 9 mg/dL (ref 6–23)
CO2: 26 meq/L (ref 19–32)
Calcium: 8.9 mg/dL (ref 8.4–10.5)
Chloride: 105 mEq/L (ref 96–112)
Creatinine, Ser: 0.75 mg/dL (ref 0.50–1.10)
GFR calc Af Amer: >90 mL/min (ref >90)
GFR calc non Af Amer: >90 mL/min (ref >90)
GLUCOSE: 107 mg/dL — AB (ref 70–99)
POTASSIUM: 3.3 meq/L — AB (ref 3.7–5.3)
SODIUM: 141 meq/L (ref 137–147)

## 2014-01-09 LAB — MAGNESIUM: MAGNESIUM: 2.1 mg/dL (ref 1.5–2.5)

## 2014-01-09 LAB — ACETAMINOPHEN LEVEL: Acetaminophen (Tylenol), Serum: <15 ug/mL (ref 10–30)

## 2014-01-09 LAB — ETHANOL

## 2014-01-09 LAB — SALICYLATE LEVEL

## 2014-01-09 MED ORDER — POTASSIUM CHLORIDE CRYS ER 20 MEQ PO TBCR
40.0000 meq | EXTENDED_RELEASE_TABLET | Freq: Once | ORAL | Status: AC
  Administered 2014-01-09: 40 meq via ORAL
  Filled 2014-01-09: qty 2

## 2014-01-09 MED ORDER — SODIUM CHLORIDE 0.9 % IJ SOLN
3.0000 mL | Freq: Two times a day (BID) | INTRAMUSCULAR | Status: DC
  Administered 2014-01-09 – 2014-01-11 (×6): 3 mL via INTRAVENOUS

## 2014-01-09 MED ORDER — ENOXAPARIN SODIUM 40 MG/0.4ML ~~LOC~~ SOLN
40.0000 mg | SUBCUTANEOUS | Status: DC
  Filled 2014-01-09 (×4): qty 0.4

## 2014-01-09 NOTE — ED Notes (Signed)
Writer: Introduced self to pt and father, Father: "[facility] Writer: "No, this is [facility] [place]" Father: "[facility], that's where we were told to go." Writer: "We will be glad to see her and provide care here." Father: "No, [facility] have to get to [facility]" Writer: tried to explain that our facility can provide services requested by [facility] at this facility. Father: "How [facility] get [facility]?" Writer: provided directions Father: "Come on, pulled daughter up from chair and ran out" Gerilyn PilgrimJacob NT and Clinical research associatewriter present during conversation tried to encourage pt to stay for treatment. They left insisting to [facility] Writer notified Minerva AreolaEric RN CN at [facility] who had not received a call from [facility]. He is aware we tried to have them stay here for treatment but they refused.

## 2014-01-09 NOTE — Plan of Care (Signed)
Problem: Discharge Progression Outcomes Goal: Tolerating diet Outcome: Completed/Met Date Met:  01/09/14 Goal: Other Discharge Outcomes/Goals Outcome: Not Applicable Date Met:  00/52/59

## 2014-01-09 NOTE — ED Notes (Signed)
Pt. wanded by security and changed into scrubs at this time.

## 2014-01-09 NOTE — ED Notes (Signed)
Pt and father left refusing treatment at this facility. Tiffany PA-C aware.

## 2014-01-09 NOTE — ED Provider Notes (Signed)
CSN: [id number]     Arrival date & time 01/09/14  1227 History   First MD Initiated Contact with Patient 01/09/14 1249     Chief Complaint  Patient presents with  . Drug Overdose  . Depression     (Consider location/radiation/quality/duration/timing/severity/associated sxs/prior Treatment) Patient is a 23 y.o. female presenting with Ingested Medication.  Ingestion This is a new problem. The current episode started 1 to 2 hours ago. The problem occurs constantly. The problem has not changed since onset.Pertinent negatives include no chest pain, no abdominal pain, no headaches and no shortness of breath. Nothing aggravates the symptoms. Nothing relieves the symptoms. She has tried nothing for the symptoms.    Past Medical History  Diagnosis Date  . Irregular menses   . Depression   . Anxiety    Past Surgical History  Procedure Laterality Date  . Knee surgery  01/2009    RIGHT KNEE   Family History  Problem Relation Age of Onset  . Hypertension Maternal Grandfather   . Hypertension Paternal Grandmother   . Diabetes Paternal Grandmother    History  Substance Use Topics  . Smoking status: Never Smoker   . Smokeless tobacco: Never Used  . Alcohol Use: Yes     Comment: occ   OB History    Gravida Para Term Preterm AB TAB SAB Ectopic Multiple Living   0 0             Review of Systems  Respiratory: Negative for shortness of breath.   Cardiovascular: Negative for chest pain.  Gastrointestinal: Negative for abdominal pain.  Neurological: Negative for headaches.  All other systems reviewed and are negative.     Allergies  Latex  Home Medications   Prior to Admission medications   Medication Sig Start Date End Date Taking? Authorizing Provider  citalopram (CELEXA) 40 MG tablet Take 40 mg by mouth daily.   Yes Historical Provider, MD  clonazePAM (KLONOPIN) 1 MG tablet Take 0.5-1 mg by mouth 2 (two) times daily as needed for anxiety (and panic attacks).   Yes  Historical Provider, MD  etonogestrel-ethinyl estradiol (NUVARING) 0.12-0.015 MG/24HR vaginal ring Insert vaginally and leave in place for 3 consecutive weeks, then remove for 1 week. 12/23/13  Yes Harrington ChallengerNancy J Young, NP  fluconazole (DIFLUCAN) 150 MG tablet Take 1 tablet (150 mg total) by mouth once. Patient not taking: Reported on 01/09/2014 11/24/13   Harrington ChallengerNancy J Young, NP   BP 118/75 mmHg  Pulse 91  Temp(Src) 98 F (36.7 C) (Oral)  Resp 11  Ht 5\' 7"  (1.702 m)  Wt 194 lb (87.998 kg)  BMI 30.38 kg/m2  SpO2 98% Physical Exam  Constitutional: She is oriented to person, place, and time. She appears well-developed and well-nourished.  HENT:  Head: Normocephalic and atraumatic.  Right Ear: External ear normal.  Left Ear: External ear normal.  Eyes: Conjunctivae and EOM are normal. Pupils are equal, round, and reactive to light.  Neck: Normal range of motion. Neck supple.  Cardiovascular: Normal rate, regular rhythm, normal heart sounds and intact distal pulses.   Pulmonary/Chest: Effort normal and breath sounds normal.  Abdominal: Soft. Bowel sounds are normal. There is no tenderness.  Musculoskeletal: Normal range of motion.  Neurological: She is alert and oriented to person, place, and time.  Skin: Skin is warm and dry.  Vitals reviewed.   ED Course  Procedures (including critical care time) Labs Review Labs Reviewed  COMPREHENSIVE METABOLIC PANEL - Abnormal; Notable for the following:  Potassium 3.6 (*)    All other components within normal limits  SALICYLATE LEVEL - Abnormal; Notable for the following:    Salicylate Lvl <2.0 (*)    All other components within normal limits  URINE RAPID DRUG SCREEN (HOSP PERFORMED) - Abnormal; Notable for the following:    Benzodiazepines POSITIVE (*)    All other components within normal limits  CBC  ETHANOL  ACETAMINOPHEN LEVEL  BASIC METABOLIC PANEL  MAGNESIUM    Imaging Review No results found.   EKG Interpretation   Date/Time:   Sunday January 09 2014 12:44:30 EST Ventricular Rate:  109 PR Interval:  134 QRS Duration: 80 QT Interval:  330 QTC Calculation: 444 R Axis:   84 Text Interpretation:  Sinus tachycardia No old tracing to compare  Confirmed by Mirian MoGentry, Matthew [phone]([id number]) on 01/09/2014 1:06:15 PM      MDM   Final diagnoses:  None    23 y.o. female with pertinent PMH of anxiety presents with intentional overdose on celexa.  Patient took between 2312 and 15 tablets approximately one hour prior to arrival. This puts her dose of proximally 600 mg. She states that she took the medicine because she was angry at her mother, denies suicidal ideation and states that she would not want to harm herself at this time.  On arrival today vitals signs and physical exam as above significant for tachycardia, otherwise patient is asymptomatic and has no other physical exam findings. I spoke with poison control who recommended admission for 24 hours for delayed cardiogenic arrhythmia. No therapy necessitated at this time. Consulted medicine for admission.    Intentional overdose    Mirian MoMatthew Gentry, MD 01/09/14 [id number]

## 2014-01-09 NOTE — ED Notes (Signed)
[facility] for further eval of possible suicidal ideation, depression, and overdose. Pt under treatment for depression and had a recent adjustment in her medications. [facility] took 12 Celexa. Pt denies being suicidal reports " I thought if I took more I would feel better."

## 2014-01-09 NOTE — Progress Notes (Signed)
[id number]; alert and oriented x 4; no complaints of pain; IV saline locked in LFAt; skin intact. Orient patient to room and unit; gave patient care guide; instructed how to use the call bell and  fall risk precautions; suicide precaution in place. Will continue to monitor the patient.

## 2014-01-09 NOTE — H&P (Signed)
Date: 01/09/2014               Patient Name:  Tara Sullivan MRN: [id number]  DOB: 28-Jun-1990 Age / Sex: 23 y.o., female   PCP: No primary care provider on file.         Medical Service: Internal Medicine Teaching Service         Attending Physician: Dr. Ginnie Smart, MD    First Contact: Dr. Senaida Ores Pager: [phone]  Second Contact: Dr. Yetta Barre Pager: [phone]       After Hours (After 5p/  First Contact Pager: [phone]  weekends / holidays): Second Contact Pager: [phone]   Chief Complaint: Celexa Overdose  History of Present Illness: Miss Cothern is a 23 yo female with PMHx of anxiety and depression who presents to the ED after overdosing on her Celexa. Patient states that this morning she had a fight with her mother around 0900-1000 am and ingested 15 of her Celexa pills to "get attention." This is a result of tension building between the daughter and her mother. Patient currently admits to some dizziness and lightheadedness, but denies any fever, chills, agitation, tremor, diaphoresis, headache, chest pain, shortness of breath, nausea, vomiting, abdominal pain, or diarrhea. Patient has been taking Celexa 40 mg daily and Klonopin 1 mg BID prn for about 2 months, prescribed by Dr. Hyman Hopes for her anxiety. Patient states she has been having worsening anxiety predominantly manifested as diarrhea. She states the Celexa and Klonopin have been helping control her symptoms. Patient states she did not want to harm herself and was unaware of the medication side effects.   Patient admits to anxiety, increased sleep and increased energy. She admits to decreased appetite. She denies irritation, guilt, hopelessness, worthlessness, change in concentration, restlessness. She denies any feelings of wanting to harm herself and denies any previous suicide attempts or overdoses.   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 She lives at home with both of  her parents. She feels safe at home and has good social support. She drinks alcohol once a week and denies all questions to the CAGE questionnaire for alcoholism. She denies any drug use. Patient is sexually active with multiple partners and uses protection. She does have a history of treated STD infections.   Meds: Current Facility-Administered Medications  Medication Dose Route Frequency Provider Last Rate Last Dose  . enoxaparin (LOVENOX) injection 40 mg  40 mg Subcutaneous Q24H Courtney Paris, MD      . sodium chloride 0.9 % injection 3 mL  3 mL Intravenous Q12H Courtney Paris, MD        Allergies: Allergies as of 01/09/2014 - Review Complete 01/09/2014  Allergen Reaction Noted  . Latex  10/18/2010   Past Medical History  Diagnosis Date  . Irregular menses   . Depression   . Anxiety    Past Surgical History  Procedure Laterality Date  . Knee surgery  01/2009    RIGHT KNEE   Family History  Problem Relation Age of Onset  . Hypertension Maternal Grandfather   . Hypertension Paternal Grandmother   . Diabetes Paternal Grandmother    History   Social History  . Marital Status: Single    Spouse Name: N/A    Number of Children: N/A  . Years of Education: N/A   Occupational History  . Not on file.   Social History Main Topics  . Smoking status: Never Smoker   .  Smokeless tobacco: Never Used  . Alcohol Use: Yes     Comment: occ  . Drug Use: No  . Sexual Activity:    Partners: Male    Birth Control/ Protection: Inserts, Pill   Other Topics Concern  . Not on file   Social History Narrative    Review of Systems: General: Denies fever, chills, fatigue, change in appetite and diaphoresis.  Respiratory: Denies SOB, chest tightness, and wheezing.   Cardiovascular: Denies chest pain and palpitations.  Gastrointestinal: Denies nausea, vomiting, abdominal pain, diarrhea, constipation, blood in stool and abdominal distention.  Genitourinary: Denies dysuria, urgency,  frequency, hematuria, suprapubic pain and flank pain. Endocrine: Denies hot or cold intolerance, polyuria, and polydipsia. Musculoskeletal: Denies myalgias, back pain, joint swelling, arthralgias and gait problem.  Skin: Denies pallor, rash and wounds.  Neurological: Admits to dizziness, Denies headaches, weakness, numbness, seizures, and syncope. Psychiatric/Behavioral: Admits to anxiety, increased sleep, decreased appetite and increased energy.  Physical Exam: Filed Vitals:   01/09/14 1345 01/09/14 1400 01/09/14 1430 01/09/14 1445  BP: 117/77 114/73 122/71 118/75  Pulse: 88 89 93 91  Temp:      TempSrc:      Resp: 21 19 22 11   Height:      Weight:      SpO2: 98% 97% 99% 98%   General: Vital signs reviewed.  Patient is well-developed and well-nourished, in no acute distress and cooperative with exam.  Eyes: EOMI, PERRLA, conjunctivae normal, no scleral icterus.  Cardiovascular: Tachycardic, regular rhythm, S1 normal, S2 normal, no murmurs, gallops, or rubs. Pulmonary/Chest: Clear to auscultation bilaterally, no wheezes, rales, or rhonchi. Abdominal: Soft, non-tender, non-distended, BS +, no masses, organomegaly, or guarding present.  Extremities: No lower extremity edema bilaterally,  pulses symmetric and intact bilaterally. No cyanosis or clubbing. Neurological: A&O x3, Strength is normal and symmetric bilaterally, cranial nerve II-XII are grossly intact, no focal motor deficit, sensory intact to light touch bilaterally.  Skin: Warm, dry and intact. No rashes or erythema. Psychiatric: Normal mood and affect. speech and behavior is normal. Cognition and memory are normal.   Neurologic Exam:   Mental Status: Alert, oriented, thought content appropriate.  Speech fluent without evidence of aphasia. Able to follow 3 step commands without difficulty.  Cranial Nerves:   II: Discs flat bilaterally. Visual fields grossly intact.  III/IV/VI: Extraocular movements intact.  Pupils reactive  bilaterally.  V/VII: Smile symmetric. facial light touch sensation normal bilaterally.  VIII: Grossly intact.  IX/X: Normal gag.  XI: Bilateral shoulder shrug normal.  XII: Midline tongue extension normal.  Motor:  5/5 bilaterally with normal tone and bulk  Sensory:  Pinprick and light touch intact throughout, bilaterally  DTRs: 2+ and symmetric throughout  Plantars:  Downgoing bilaterally  Cerebellar: Normal finger-to-nose, normal rapid alternating movements and normal heel-to-shin test.  Normal gait and station.     Lab results: Basic Metabolic Panel:  Recent Labs  40/98/1109/10/16 1246  NA 140  K 3.6*  CL 103  CO2 22  GLUCOSE 82  BUN 9  CREATININE 0.69  CALCIUM 9.2   Liver Function Tests:  Recent Labs  01/09/14 1246  AST 18  ALT 19  ALKPHOS 50  BILITOT 0.3  PROT 7.3  ALBUMIN 3.9   CBC:  Recent Labs  01/09/14 1246  WBC 8.4  HGB 14.1  HCT 40.0  MCV 86.4  PLT 239   Urine Drug Screen: Drugs of Abuse     Component Value Date/Time   LABOPIA NONE DETECTED 01/09/2014 1305  COCAINSCRNUR NONE DETECTED 01/09/2014 1305   LABBENZ POSITIVE* 01/09/2014 1305   AMPHETMU NONE DETECTED 01/09/2014 1305   THCU NONE DETECTED 01/09/2014 1305   LABBARB NONE DETECTED 01/09/2014 1305    Alcohol Level:  Recent Labs  01/09/14 1246  ETH <11    Other results:  EKG: sinus tachycardia.  Assessment & Plan by Problem: Principal Problem:   Selective serotonin re-uptake inhibitor overdose  Moderate SSRI Overdose: Patient presents to the ED after an intentional ingestion of 15 tablets of Celexa 40 mg for a total of 600 mg around 0900-1000 this morning. Patient was in an argument with her mother and wanted to get attention. Patient is currently asymptomatic. Patient denies any seizures, chest pain, or palpitations. She is afebrile, tachycardic, and normotensive. EKG shows sinus tachycardia without QT prolongation (330/444) without priors for comparison. Acetaminophen, salicylate,  and ethanol level Patient had a normal physical exam. She did not have signs of serotonin syndrome including hyperthermia, diaphoresis, ocular clonus, tremor, altered mental status, pupil dilation, dry skin or increased bowel sounds. Citalopram, or Celexa, has the greatest risk for serious toxicity as compared to the other SSRIs. Patient is classified as having a moderate overdose (>600 mg ) putting her at risk for nausea, dizziness, tachycardia, tremor, somnolence and more seriously, cardiac and neurologic toxicity. QTc prolongation may peak at 13 hours (2200-2400 in our patient). Patient also has about an 18% risk of seizure due to a 600 mg ingestion.  -BMET at 2000 and tomorrow am -Bed rest -Cardiac monitor -CBC tomorrow am -NPO -Repeat EKG at 2000 and tomorrow morning -Neuro checks Q4H -Seizure precautions  -Suicide precautions -Consult to Psychiatry -If patient develops serotonin syndrome, consider cyproheptadine -If QTc is prolongated or is elongating, order serial EKGs -If patient develops wide-complex tachycardia, consider sodium bicarb followed by sodium bicarb infusion  Generalized Anxiety with IBS manifestation: Patient has anxiety for which she sees Dr. Hyman HopesWebb. She recently started Celexa and Klonopin 2 months ago, which have helped control her symptoms. Patient positively answered 4/8 SIG E CAPS questions for increased sleep, increased energy, decreased appetite and anxiety. Patient denied any CAGE questions. -Hold Klonopin -Hold Celexa  DVT/PE ppx:  Lovenox daily  Dispo: Disposition is deferred at this time, awaiting improvement of current medical problems. Anticipated discharge in approximately 1-2 day(s).   The patient does have a current PCP (No primary care provider on file.) and does not [facility] after discharge.  The patient does have transportation limitations that hinder transportation to clinic appointments.  Signed: Jill AlexandersAlexa Richardson,  DO PGY-1 Internal Medicine Resident Pager # [phone] 01/09/2014 3:47 PM

## 2014-01-09 NOTE — Plan of Care (Signed)
Problem: Phase I Progression Outcomes Goal: OOB as tolerated unless otherwise ordered Outcome: Completed/Met Date Met:  01/09/14 Goal: Voiding-avoid urinary catheter unless indicated Outcome: Completed/Met Date Met:  01/09/14 Goal: Other Phase I Outcomes/Goals Outcome: Not Applicable Date Met:  63/84/53  Problem: Phase II Progression Outcomes Goal: IV changed to normal saline lock Outcome: Completed/Met Date Met:  01/09/14 Goal: Obtain order to discontinue catheter if appropriate Outcome: Not Applicable Date Met:  64/68/03 Goal: Other Phase II Outcomes/Goals Outcome: Not Applicable Date Met:  21/22/48  Problem: Phase III Progression Outcomes Goal: Foley discontinued Outcome: Not Applicable Date Met:  25/00/37

## 2014-01-09 NOTE — Discharge Summary (Signed)
Name: Tara Sullivan MRN: [id number] DOB: 12/04/1990 23 y.o. PCP: Dr. Hyman HopesWebb  Date of Admission: 01/09/2014 12:48 PM Date of Discharge: 01/12/2014 Attending Physician: Dr. Ninetta LightsHatcher Discharge Diagnosis:  Principal Problem:   Selective serotonin re-uptake inhibitor overdose Active Problems:   Generalized anxiety disorder   Depression  Discharge Medications:   Medication List    STOP taking these medications        citalopram 40 MG tablet  Commonly known as:  CELEXA     clonazePAM 1 MG tablet  Commonly known as:  KLONOPIN      TAKE these medications        etonogestrel-ethinyl estradiol 0.12-0.015 MG/24HR vaginal ring  Commonly known as:  NUVARING  Insert vaginally and leave in place for 3 consecutive weeks, then remove for 1 week.     fluconazole 150 MG tablet  Commonly known as:  DIFLUCAN  Take 1 tablet (150 mg total) by mouth once.        Disposition and follow-up:   Ms.Kambria Vezina [facility].  At the hospital follow up visit please address:  Anxiety and depression symptoms and thoughts of suicide. Please make sure patient is following up with counseling. Patient also needs to be established with outpatient psychiatry.   2.  Labs / imaging needed at time of follow-up: None  3.  Pending labs/ test needing follow-up: None  Follow-up Appointments: Follow-up Information    Follow up with Shirlean MylarWEBB, CAROL, D, MD On 01/18/2014.   Specialty:  Family Medicine   Why:  3:15 pm   Contact information:   [address] [place] [address] [phone]       Discharge Instructions: Discharge Instructions    Diet - low sodium heart healthy    Complete by:  As directed      Increase activity slowly    Complete by:  As directed            Consultations: Treatment Team:  Nehemiah SettleJanardhaha R Jonnalagadda, MDPsychiatry  Admission HPI: Miss Tara Sullivan is a 23 yo female with PMHx of anxiety  and depression who presents to the ED after overdosing on her Celexa. Patient states that this morning she had a fight with her mother around 0900-1000 am and ingested 15 of her Celexa pills to "get attention." This is a result of tension building between the daughter and her mother. Patient currently admits to some dizziness and lightheadedness, but denies any fever, chills, agitation, tremor, diaphoresis, headache, chest pain, shortness of breath, nausea, vomiting, abdominal pain, or diarrhea. Patient has been taking Celexa 40 mg daily and Klonopin 1 mg BID prn for about 2 months, prescribed by Dr. Hyman HopesWebb for her anxiety. Patient states she has been having worsening anxiety predominantly manifested as diarrhea. She states the Celexa and Klonopin have been helping control her symptoms. Patient states she did not want to harm herself and was unaware of the medication side effects.   Patient admits to anxiety, increased sleep and increased energy. She admits to decreased appetite. She denies irritation, guilt, hopelessness, worthlessness, change in concentration, restlessness. She denies any feelings of wanting to harm herself and denies any previous suicide attempts or overdoses.   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 She lives at home with both of her parents. She feels safe at home and has good social support. She drinks alcohol once a week and denies all  questions to the CAGE questionnaire for alcoholism. She denies any drug use. Patient is sexually active with multiple partners and uses protection. She does have a history of treated STD infections.   Hospital Course by problem list: Principal Problem:   Selective serotonin re-uptake inhibitor overdose Active Problems:   Generalized anxiety disorder   Depression   Moderate SSRI Overdose: Patient presented to the ED after an intentional ingestion of 15 tablets of Celexa 40 mg for a total of 600 mg  around 0900-1000 in the morning. Patient was in an argument with her mother and wanted to get attention. Patient was asymptomatic on arrival. Patient denied any seizures, chest pain, or palpitations. She was afebrile, tachycardic, and normotensive. EKG showed sinus tachycardia without QT prolongation (330/444) without priors for comparison. Acetaminophen, salicylate, and ethanol level were normal. Patient had a normal physical exam. She did not have signs of serotonin syndrome including hyperthermia, diaphoresis, ocular clonus, tremor, altered mental status, pupil dilation, dry skin or increased bowel sounds. Citalopram, or Celexa, has the greatest risk for serious toxicity as compared to the other SSRIs. Patient is classified as having a moderate overdose (>600 mg) putting her at risk for nausea, dizziness, tachycardia, tremor, somnolence and more seriously, cardiac and neurologic toxicity. QTc prolongation did not peak at 13 hours (2200-2400 in our patient). Patient had an 18% risk of seizure due to a 600 mg ingestion. We placed the patient on neuro checks, seizure precautions, suicide precautions, cardiac monitoring and had a sitter for her at all times. We consulted psychiatry who recommended 72 hour medical observation for signs of serotonin syndrome. Patient remained stable and without signs of serotonin syndrome for her entire stay. After discussion with Dr. Elsie SaasJonnalagadda, patient can decide whether or not she would prefer inpatient or outpatient psychiatric treatment. This was discussed with her counselor. Patient discussed with her counselor and would like outpatient psychiatric follow up. Patient has an appointment with her counselor tomorrow and with Dr. Hyman HopesWebb soon. I have provided her with resources to follow up with for psychiatric help. Patient can also talk with Dr. Hyman HopesWebb and with her counselor about psychiatric treatment options. Patient and family are agreeable with plan. Patient denies any suicidal  ideations. Patient is cleared from a medical standpoint after 72 hour observation.  Generalized Anxiety with IBS manifestation: Patient has anxiety for which she sees Dr. Hyman HopesWebb. She recently started Celexa and Klonopin 2 months ago, which have helped control her symptoms. Patient positively answered 4/8 SIG E CAPS questions for increased sleep, increased energy, decreased appetite and anxiety. Patient denied any CAGE questions. We held her klonopin and celexa on admission and on discharge. Future treatment will be discussed with her psychiatrist and with her PCP.  Discharge Vitals:   BP 114/68 mmHg  Pulse 64  Temp(Src) 98.8 F (37.1 C) (Oral)  Resp 0  Ht 5\' 7"  (1.702 m)  Wt 185 lb 12.8 oz (84.278 kg)  BMI 29.09 kg/m2  SpO2 99%  Discharge Labs:  Results for orders placed or performed during the hospital encounter of 01/09/14 (from the past 24 hour(s))  Basic metabolic panel     Status: None   Collection Time: 01/12/14  5:13 AM  Result Value Ref Range   Sodium 139 137 - 147 mEq/L   Potassium 4.1 3.7 - 5.3 mEq/L   Chloride 104 96 - 112 mEq/L   CO2 24 19 - 32 mEq/L   Glucose, Bld 89 70 - 99 mg/dL   BUN 9 6 - 23  mg/dL   Creatinine, Ser 1.61 0.50 - 1.10 mg/dL   Calcium 8.8 8.4 - 09.6 mg/dL   GFR calc non Af Amer >90 >90 mL/min   GFR calc Af Amer >90 >90 mL/min   Anion gap 11 5 - 15    Signed: Jill Alexanders, DO PGY-1 Internal Medicine Resident Pager # [phone] 01/12/2014 5:44 PM

## 2014-01-10 DIAGNOSIS — K589 Irritable bowel syndrome without diarrhea: Secondary | ICD-10-CM | POA: Diagnosis not present

## 2014-01-10 DIAGNOSIS — T43222A Poisoning by selective serotonin reuptake inhibitors, intentional self-harm, initial encounter: Secondary | ICD-10-CM | POA: Diagnosis not present

## 2014-01-10 DIAGNOSIS — T43221A Poisoning by selective serotonin reuptake inhibitors, accidental (unintentional), initial encounter: Secondary | ICD-10-CM

## 2014-01-10 DIAGNOSIS — T43202A Poisoning by unspecified antidepressants, intentional self-harm, initial encounter: Secondary | ICD-10-CM | POA: Diagnosis not present

## 2014-01-10 DIAGNOSIS — F332 Major depressive disorder, recurrent severe without psychotic features: Secondary | ICD-10-CM

## 2014-01-10 DIAGNOSIS — F411 Generalized anxiety disorder: Secondary | ICD-10-CM

## 2014-01-10 LAB — CBC
HEMATOCRIT: 38.3 % (ref 36.0–46.0)
Hemoglobin: 12.9 g/dL (ref 12.0–15.0)
MCH: 29.9 pg (ref 26.0–34.0)
MCHC: 33.7 g/dL (ref 30.0–36.0)
MCV: 88.7 fL (ref 78.0–100.0)
Platelets: 221 10*3/uL (ref 150–400)
RBC: 4.32 MIL/uL (ref 3.87–5.11)
RDW: 12.2 % (ref 11.5–15.5)
WBC: 5.9 10*3/uL (ref 4.0–10.5)

## 2014-01-10 LAB — BASIC METABOLIC PANEL
ANION GAP: 12 (ref 5–15)
BUN: 9 mg/dL (ref 6–23)
CHLORIDE: 107 meq/L (ref 96–112)
CO2: 23 meq/L (ref 19–32)
Calcium: 9 mg/dL (ref 8.4–10.5)
Creatinine, Ser: 0.83 mg/dL (ref 0.50–1.10)
GFR calc Af Amer: >90 mL/min (ref >90)
GFR calc non Af Amer: >90 mL/min (ref >90)
GLUCOSE: 89 mg/dL (ref 70–99)
POTASSIUM: 4 meq/L (ref 3.7–5.3)
SODIUM: 142 meq/L (ref 137–147)

## 2014-01-10 LAB — PREGNANCY, URINE: Preg Test, Ur: NEGATIVE

## 2014-01-10 NOTE — Progress Notes (Signed)
Subjective:  Patient was seen and examined this morning. Patient states she is doing well, she has no complaints. Patient does admit to once episode of diarrhea, but this is common for her when she is anxious. Diarrhea was not especially watery or malodorous. Patient denies confusion, tremors, dizziness, lightheadedness, chest pain, palpitations, nausea, vomiting abdominal pain.  Patient's mother worries that the Celexa made her daughter more moody than normal and this is what motivated her to take so many pills. They also inquire to when or if her daughter should restart the medications and if she should not restart it, is it okay to stop it abruptly.  Objective: Vital signs in last 24 hours: Filed Vitals:   01/10/14 0353 01/10/14 0603 01/10/14 0800 01/10/14 1000  BP: 117/71 114/70 127/69 135/77  Pulse: 75 73 67 73  Temp: 98.2 F (36.8 C) 98.3 F (36.8 C) 98.2 F (36.8 C) 98.4 F (36.9 C)  TempSrc: Oral Oral Oral Oral  Resp: 18 20 18 19   Height:      Weight:      SpO2: 99% 100% 100% 100%   Weight change:   Intake/Output Summary (Last 24 hours) at 01/10/14 1112 Last data filed at 01/10/14 0008  Gross per 24 hour  Intake      3 ml  Output      0 ml  Net      3 ml   Filed Vitals:   01/10/14 0353 01/10/14 0603 01/10/14 0800 01/10/14 1000  BP: 117/71 114/70 127/69 135/77  Pulse: 75 73 67 73  Temp: 98.2 F (36.8 C) 98.3 F (36.8 C) 98.2 F (36.8 C) 98.4 F (36.9 C)  TempSrc: Oral Oral Oral Oral  Resp: 18 20 18 19   Height:      Weight:      SpO2: 99% 100% 100% 100%   General: Vital signs reviewed. Patient is well-developed and well-nourished, in no acute distress and cooperative with exam.  Eyes: EOMI, conjunctivae normal, no scleral icterus.  Cardiovascular: Regular rate, regular rhythm, S1 normal, S2 normal, no murmurs, gallops, or rubs. Pulmonary/Chest: Clear to auscultation bilaterally, no wheezes, rales, or rhonchi. Abdominal: Soft, non-tender, non-distended, BS  +, no masses, organomegaly, or guarding present.  Extremities: No lower extremity edema bilaterally, pulses symmetric and intact bilaterally. No cyanosis or clubbing. Skin: Warm, dry and intact. No rashes or erythema. Psychiatric: Normal mood and affect. speech and behavior is normal. Cognition and memory are normal.   Lab Results: Basic Metabolic Panel:  Recent Labs Lab 01/09/14 1246 01/09/14 1948 01/10/14 0530  NA 140 141 142  K 3.6* 3.3* 4.0  CL 103 105 107  CO2 22 26 23   GLUCOSE 82 107* 89  BUN 9 9 9   CREATININE 0.69 0.75 0.83  CALCIUM 9.2 8.9 9.0  MG 2.1  --   --    Liver Function Tests:  Recent Labs Lab 01/09/14 1246  AST 18  ALT 19  ALKPHOS 50  BILITOT 0.3  PROT 7.3  ALBUMIN 3.9   CBC:  Recent Labs Lab 01/09/14 1246 01/10/14 0530  WBC 8.4 5.9  HGB 14.1 12.9  HCT 40.0 38.3  MCV 86.4 88.7  PLT 239 221   Urine Drug Screen: Drugs of Abuse     Component Value Date/Time   LABOPIA NONE DETECTED 01/09/2014 1305   COCAINSCRNUR NONE DETECTED 01/09/2014 1305   LABBENZ POSITIVE* 01/09/2014 1305   AMPHETMU NONE DETECTED 01/09/2014 1305   THCU NONE DETECTED 01/09/2014 1305   LABBARB NONE  DETECTED 01/09/2014 1305    Alcohol Level:  Recent Labs Lab 01/09/14 1246  ETH <11   Medications:  I have reviewed the patient's current medications. Prior to Admission:  Prescriptions prior to admission  Medication Sig Dispense Refill Last Dose  . citalopram (CELEXA) 40 MG tablet Take 40 mg by mouth daily.   01/09/2014 at Unknown time  . clonazePAM (KLONOPIN) 1 MG tablet Take 0.5-1 mg by mouth 2 (two) times daily as needed for anxiety (and panic attacks).   Past Week at Unknown time  . etonogestrel-ethinyl estradiol (NUVARING) 0.12-0.015 MG/24HR vaginal ring Insert vaginally and leave in place for 3 consecutive weeks, then remove for 1 week. 9 each 3 Past Month at Unknown time  . fluconazole (DIFLUCAN) 150 MG tablet Take 1 tablet (150 mg total) by mouth once.  (Patient not taking: Reported on 01/09/2014) 1 tablet 1 Taking   Scheduled Meds: . enoxaparin (LOVENOX) injection  40 mg Subcutaneous Q24H  . sodium chloride  3 mL Intravenous Q12H   Continuous Infusions:  PRN Meds:. Assessment/Plan: Principal Problem:   Selective serotonin re-uptake inhibitor overdose Active Problems:   Generalized anxiety disorder   Depression  Moderate SSRI Overdose: Patient did well overnight. Her vital signs and labs were within normal limits. Patient had no QTc prolongation (429). She had no evidence of seizures or seizure like activity. She continues to be normal sinus on EKG and RRR on cardiac auscultation. -Bed rest -Cardiac monitor -Neuro checks Q4H -Seizure precautions  -Suicide precautions -Sitter -Consult to Psychiatry, appreciate recommendations  Generalized Anxiety with IBS manifestation: Patient has anxiety for which she sees Dr. Hyman HopesWebb. She recently started Celexa and Klonopin 2 months ago, which have helped control her symptoms. Patient and mother seem hesitant to continue Celexa. Patient may benefit from alternative treatment. -Hold Klonopin -Hold Celexa -Appreciate Psych recommendations -Follow up with Dr. Hyman HopesWebb  DVT/PE ppx: Lovenox daily  Dispo: Disposition is deferred at this time, awaiting improvement of current medical problems.  Anticipated discharge in approximately 1-2 day(s).   The patient does have a current PCP (No primary care provider on file.) and [facility] after [facility].  The patient does not have transportation limitations that hinder transportation to clinic appointments.  .Services Needed at time of discharge: Y = Yes, Blank = No PT:   OT:   RN:   Equipment:   Other:     LOS: 1 day   Jill AlexandersAlexa Richardson, DO PGY-1 Internal Medicine Resident Pager # [phone] 01/10/2014 11:12 AM

## 2014-01-10 NOTE — Progress Notes (Signed)
CARE MANAGEMENT NOTE 01/10/2014  Patient:  Alroy DustGAMBARDELLA,Trini   Account Number:  [id number]  Date Initiated:  01/10/2014  Documentation initiated by:  [facility],Shantella Blubaugh  Subjective/Objective Assessment:   anxiety, depression     Action/Plan:   lives at home with parents   Anticipated DC Date:  01/11/2014   Anticipated DC Plan:  HOME/SELF CARE      DC Planning Services  CM consult      Choice offered to / List presented to:             Status of service:  Completed, signed off Medicare Important Message given?   (If response is "NO", the following Medicare IM given date fields will be blank) Date Medicare IM given:   Medicare IM given by:   Date Additional Medicare IM given:   Additional Medicare IM given by:    Discharge Disposition:  HOME/SELF CARE  Per UR Regulation:  Reviewed for med. necessity/level of care/duration of stay  If discussed at Long Length of Stay Meetings, dates discussed:    Comments:  01/10/2014 1530 NCM spoke to pt and gave permission to speak to mother. States she is currently receiving counseling outpt. She has no difficulty with paying for meds. She goes to college part-time and works also. Waiting final recommendations for home. No dc needs identified at this time. Isidoro DonningAlesia Decie Verne RN CCM Case Mgmt phone [phone]

## 2014-01-10 NOTE — Plan of Care (Signed)
Problem: Phase III Progression Outcomes Goal: Voiding independently Outcome: Completed/Met Date Met:  01/10/14

## 2014-01-10 NOTE — Progress Notes (Signed)
UR completed 

## 2014-01-10 NOTE — Consult Note (Signed)
[name] Face-to-Face Psychiatry Consult   Reason for Consult:   intentional overdose with suicidal attempt Referring Physician:  Dr. Talmadge Coventry is an 23 y.o. female. Total Time spent with patient: 45 minutes  Assessment: AXIS I:  Generalized Anxiety Disorder and Major Depression, Recurrent severe AXIS II:  Deferred AXIS III:   Past Medical History  Diagnosis Date  . Irregular menses   . Depression   . Anxiety    AXIS IV:  other psychosocial or environmental problems, problems related to social environment and problems with primary support group AXIS V:  41-50 serious symptoms  Plan:  Recommend psychiatric Inpatient admission when medically cleared. Supportive therapy provided about ongoing stressors.  Continue safety sitter No antidepressant medication especially SSRI due to possible serotonin syndrome Patient needs to be monitor for at least 72 hours for possible toxic effects of overdose and seizures etc No psychotropic medication treatment for at least five days due to intentional overdose.   Subjective:   Tara Sullivan is a 23 y.o. female patient admitted for intentional overdose with suicidal attempt.  HPI:  Patient is seen, chart reviewed, case discussed with patient and her mother who is at bed side. Patient complained that she has been depressed, anxious over som time and she was previously received individual therapy and started medication for generalized anxiety disorder and unable to participate in school program from a PCP. Patient stated that she broke up with her boy friend about a week ago, her sister gave a birth to a baby and he brother bought a house and no one paying attention to her for the past week or so. She has an argument with her mother prior to overdose on her medication and than informed to her mother and seeks medication treatment. Patient minimizes her intentional overdose saying she is trying to get attention instead of suicidal attempt and  feels regrets her action. She feels she does not needed further treatment. Patient has no previous suicidal attempts. 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 Patient is tearful during this visit briefly. Patient and her mother understand the medical necessity and possible psych admission.   Medical history: Tara Sullivan is a 23 yo female with PMHx of anxiety and depression who presents to the ED after overdosing on her Celexa. Patient states that this morning she had a fight with her mother around 0900-1000 am and ingested 15 of her Celexa pills to "get attention." This is a result of tension building between the daughter and her mother. Patient currently admits to some dizziness and lightheadedness, but denies any fever, chills, agitation, tremor, diaphoresis, headache, chest pain, shortness of breath, nausea, vomiting, abdominal pain, or diarrhea. Patient has been taking Celexa 40 mg daily and Klonopin 1 mg BID prn for about 2 months, prescribed by Dr. Justin Mend for her anxiety. Patient states she has been having worsening anxiety predominantly manifested as diarrhea. She states the Celexa and Klonopin have been helping control her symptoms. Patient states she did not want to harm herself and was unaware of the medication side effects. Patient admits to anxiety, increased sleep and increased energy. She admits to decreased appetite. She denies irritation, guilt, hopelessness, worthlessness, change in concentration, restlessness. She denies any feelings of wanting to harm herself and denies any previous suicide attempts or overdoses.   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 She lives at home with  both of her parents. She feels safe at home and has good social support. She drinks alcohol once a week and denies all questions to the CAGE questionnaire for alcoholism. She denies any drug use. Patient is sexually active  with multiple partners and uses protection. She does have a history of treated STD infections.   HPI Elements:   Location:  depression. Quality:  poor]. Severity:  suicial attempt. Timing:  broke up with boy friend and argument with parent. Duration:  one week. Context:  say this is an attention seeking attempt.  Past Psychiatric History: Past Medical History  Diagnosis Date  . Irregular menses   . Depression   . Anxiety     reports that she has never smoked. She has never used smokeless tobacco. She reports that she drinks alcohol. She reports that she does not use illicit drugs. Family History  Problem Relation Age of Onset  . Hypertension Maternal Grandfather   . Hypertension Paternal Grandmother   . Diabetes Paternal Grandmother      Living Arrangements: Parent   Abuse/Neglect [facility]) Physical Abuse: Denies Verbal Abuse: Denies Sexual Abuse: Denies Allergies:   Allergies  Allergen Reactions  . Latex     Swelling and burning of skin    ACT Assessment Complete:  NO Objective: Blood pressure 127/69, pulse 67, temperature 98.2 F (36.8 C), temperature source Oral, resp. rate 18, height $RemoveBe'5\' 7"'EwrysrQRT$  (1.702 m), weight 84.278 kg (185 lb 12.8 oz), SpO2 100 %.Body mass index is 29.09 kg/(m^2). Results for orders placed or performed during the hospital encounter of 01/09/14 (from the past 72 hour(s))  CBC     Status: None   Collection Time: 01/09/14 12:46 PM  Result Value Ref Range   WBC 8.4 4.0 - 10.5 K/uL   RBC 4.63 3.87 - 5.11 MIL/uL   Hemoglobin 14.1 12.0 - 15.0 g/dL   HCT 40.0 36.0 - 46.0 %   MCV 86.4 78.0 - 100.0 fL   MCH 30.5 26.0 - 34.0 pg   MCHC 35.3 30.0 - 36.0 g/dL   RDW 11.9 11.5 - 15.5 %   Platelets 239 150 - 400 K/uL  Comprehensive metabolic panel     Status: Abnormal   Collection Time: 01/09/14 12:46 PM  Result Value Ref Range   Sodium 140 137 - 147 mEq/L   Potassium 3.6 (L) 3.7 - 5.3 mEq/L   Chloride 103 96 - 112 mEq/L   CO2 22 19 - 32 mEq/L   Glucose,  Bld 82 70 - 99 mg/dL   BUN 9 6 - 23 mg/dL   Creatinine, Ser 0.69 0.50 - 1.10 mg/dL   Calcium 9.2 8.4 - 10.5 mg/dL   Total Protein 7.3 6.0 - 8.3 g/dL   Albumin 3.9 3.5 - 5.2 g/dL   AST 18 0 - 37 U/L   ALT 19 0 - 35 U/L   Alkaline Phosphatase 50 39 - 117 U/L   Total Bilirubin 0.3 0.3 - 1.2 mg/dL   GFR calc non Af Amer >90 >90 mL/min   GFR calc Af Amer >90 >90 mL/min    Comment: (NOTE) The eGFR has been calculated using the CKD EPI equation. This calculation has not been validated in all clinical situations. eGFR's persistently <90 mL/min signify possible Chronic Kidney Disease.    Anion gap 15 5 - 15  Ethanol (ETOH)     Status: None   Collection Time: 01/09/14 12:46 PM  Result Value Ref Range   Alcohol, Ethyl (B) <11 0 - 11  mg/dL    Comment:        LOWEST DETECTABLE LIMIT FOR SERUM ALCOHOL IS 11 mg/dL FOR MEDICAL PURPOSES ONLY   Acetaminophen level     Status: None   Collection Time: 01/09/14 12:46 PM  Result Value Ref Range   Acetaminophen (Tylenol), Serum <15.0 10 - 30 ug/mL    Comment:        THERAPEUTIC CONCENTRATIONS VARY SIGNIFICANTLY. A RANGE OF 10-30 ug/mL MAY BE AN EFFECTIVE CONCENTRATION FOR MANY PATIENTS. HOWEVER, SOME ARE BEST TREATED AT CONCENTRATIONS OUTSIDE THIS RANGE. ACETAMINOPHEN CONCENTRATIONS >150 ug/mL AT 4 HOURS AFTER INGESTION AND >50 ug/mL AT 12 HOURS AFTER INGESTION ARE OFTEN ASSOCIATED WITH TOXIC REACTIONS.   Salicylate level     Status: Abnormal   Collection Time: 01/09/14 12:46 PM  Result Value Ref Range   Salicylate Lvl <1.0 (L) 2.8 - 20.0 mg/dL  Magnesium     Status: None   Collection Time: 01/09/14 12:46 PM  Result Value Ref Range   Magnesium 2.1 1.5 - 2.5 mg/dL  Urine rapid drug screen (hosp performed)     Status: Abnormal   Collection Time: 01/09/14  1:05 PM  Result Value Ref Range   Opiates NONE DETECTED NONE DETECTED   Cocaine NONE DETECTED NONE DETECTED   Benzodiazepines POSITIVE (A) NONE DETECTED   Amphetamines NONE  DETECTED NONE DETECTED   Tetrahydrocannabinol NONE DETECTED NONE DETECTED   Barbiturates NONE DETECTED NONE DETECTED    Comment:        DRUG SCREEN FOR MEDICAL PURPOSES ONLY.  IF CONFIRMATION IS NEEDED FOR ANY PURPOSE, NOTIFY LAB WITHIN 5 DAYS.        LOWEST DETECTABLE LIMITS FOR URINE DRUG SCREEN Drug Class       Cutoff (ng/mL) Amphetamine      1000 Barbiturate      200 Benzodiazepine   258 Tricyclics       527 Opiates          300 Cocaine          300 THC              50   Basic metabolic panel     Status: Abnormal   Collection Time: 01/09/14  7:48 PM  Result Value Ref Range   Sodium 141 137 - 147 mEq/L   Potassium 3.3 (L) 3.7 - 5.3 mEq/L   Chloride 105 96 - 112 mEq/L   CO2 26 19 - 32 mEq/L   Glucose, Bld 107 (H) 70 - 99 mg/dL   BUN 9 6 - 23 mg/dL   Creatinine, Ser 0.75 0.50 - 1.10 mg/dL   Calcium 8.9 8.4 - 10.5 mg/dL   GFR calc non Af Amer >90 >90 mL/min   GFR calc Af Amer >90 >90 mL/min    Comment: (NOTE) The eGFR has been calculated using the CKD EPI equation. This calculation has not been validated in all clinical situations. eGFR's persistently <90 mL/min signify possible Chronic Kidney Disease.    Anion gap 10 5 - 15  Pregnancy, urine     Status: None   Collection Time: 01/10/14  4:53 AM  Result Value Ref Range   Preg Test, Ur NEGATIVE NEGATIVE    Comment:        THE SENSITIVITY OF THIS METHODOLOGY IS >20 mIU/mL.   Basic metabolic panel     Status: None   Collection Time: 01/10/14  5:30 AM  Result Value Ref Range   Sodium 142 137 - 147 mEq/L  Potassium 4.0 3.7 - 5.3 mEq/L   Chloride 107 96 - 112 mEq/L   CO2 23 19 - 32 mEq/L   Glucose, Bld 89 70 - 99 mg/dL   BUN 9 6 - 23 mg/dL   Creatinine, Ser 0.83 0.50 - 1.10 mg/dL   Calcium 9.0 8.4 - 10.5 mg/dL   GFR calc non Af Amer >90 >90 mL/min   GFR calc Af Amer >90 >90 mL/min    Comment: (NOTE) The eGFR has been calculated using the CKD EPI equation. This calculation has not been validated in all  clinical situations. eGFR's persistently <90 mL/min signify possible Chronic Kidney Disease.    Anion gap 12 5 - 15  CBC     Status: None   Collection Time: 01/10/14  5:30 AM  Result Value Ref Range   WBC 5.9 4.0 - 10.5 K/uL   RBC 4.32 3.87 - 5.11 MIL/uL   Hemoglobin 12.9 12.0 - 15.0 g/dL   HCT 38.3 36.0 - 46.0 %   MCV 88.7 78.0 - 100.0 fL   MCH 29.9 26.0 - 34.0 pg   MCHC 33.7 30.0 - 36.0 g/dL   RDW 12.2 11.5 - 15.5 %   Platelets 221 150 - 400 K/uL   Labs are reviewed.  Current Facility-Administered Medications  Medication Dose Route Frequency Provider Last Rate Last Dose  . enoxaparin (LOVENOX) injection 40 mg  40 mg Subcutaneous Q24H Corky Sox, MD   40 mg at 01/09/14 1700  . sodium chloride 0.9 % injection 3 mL  3 mL Intravenous Q12H Corky Sox, MD   3 mL at 01/10/14 0008    Psychiatric Specialty Exam: Physical Exam as per history and physical  Review of Systems  Constitutional: Positive for malaise/fatigue.  Eyes: Positive for blurred vision.  Gastrointestinal: Positive for nausea.  Musculoskeletal: Positive for myalgias.  Neurological: Positive for dizziness, weakness and headaches.  Psychiatric/Behavioral: Positive for depression and suicidal ideas. The patient is nervous/anxious.     Blood pressure 127/69, pulse 67, temperature 98.2 F (36.8 C), temperature source Oral, resp. rate 18, height $RemoveBe'5\' 7"'KeyLXlRZs$  (1.702 m), weight 84.278 kg (185 lb 12.8 oz), SpO2 100 %.Body mass index is 29.09 kg/(m^2).  General Appearance: Guarded  Eye Contact::  Good  Speech:  Clear and Coherent and Slow  Volume:  Decreased  Mood:  Anxious and Depressed  Affect:  Congruent and Depressed  Thought Process:  Coherent and Goal Directed  Orientation:  Full (Time, Place, and Person)  Thought Content:  WDL  Suicidal Thoughts:  Yes.  without intent/plan  Homicidal Thoughts:  No  Memory:  Immediate;   Fair Recent;   Fair  Judgement:  Impaired  Insight:  Lacking  Psychomotor Activity:   Decreased  Concentration:  Fair  Recall:  Good  Fund of Knowledge:Good  Language: Good  Akathisia:  NA  Handed:  Right  AIMS (if indicated):     Assets:  Communication Skills Desire for Improvement Financial Resources/Insurance Housing Intimacy Leisure Time [place] Talents/Skills Transportation Vocational/Educational  Sleep:      Musculoskeletal: Strength & Muscle Tone: decreased Gait & Station: normal Patient leans: N/A  Treatment Plan Summary: Daily contact with patient to assess and evaluate symptoms and progress in treatment Medication management  No antidepressant medication especially SSRI due to possible serotonin syndrome Patient needs to be monitor for at least 72 hours for possible toxic effects of overdose and seizures etc No psychotropic medication treatment for at least five days due  to intentional overdose.    Tara Sullivan,JANARDHAHA R. 01/10/2014 10:15 AM

## 2014-01-11 DIAGNOSIS — K589 Irritable bowel syndrome without diarrhea: Secondary | ICD-10-CM | POA: Diagnosis not present

## 2014-01-11 DIAGNOSIS — T43222A Poisoning by selective serotonin reuptake inhibitors, intentional self-harm, initial encounter: Secondary | ICD-10-CM | POA: Diagnosis not present

## 2014-01-11 DIAGNOSIS — F411 Generalized anxiety disorder: Secondary | ICD-10-CM | POA: Diagnosis not present

## 2014-01-11 LAB — BASIC METABOLIC PANEL
Anion gap: 10 (ref 5–15)
BUN: 7 mg/dL (ref 6–23)
CHLORIDE: 103 meq/L (ref 96–112)
CO2: 25 mEq/L (ref 19–32)
Calcium: 8.6 mg/dL (ref 8.4–10.5)
Creatinine, Ser: 0.77 mg/dL (ref 0.50–1.10)
GLUCOSE: 89 mg/dL (ref 70–99)
Potassium: 4 mEq/L (ref 3.7–5.3)
SODIUM: 138 meq/L (ref 137–147)

## 2014-01-11 NOTE — Clinical Social Work Psych Note (Signed)
CSW has contacted the following facilities regarding possible inpatient psychiatric admission:  [facility] - at capacity [facility] - 1 female bed available - referral faxed [facility]- referral faxed [facility]- at capacity [facility] - at capacity [facility]- at capacity [facility] - at capacity [facility]- at capacity  Psych CSW continues to assist with disposition.  Gina Kamrin Sibley, LCSWA [phone]  Psychiatric & Orthopedics (5N 1-16) Clinical Social Worker  

## 2014-01-11 NOTE — Progress Notes (Signed)
UR completed 

## 2014-01-11 NOTE — Plan of Care (Signed)
Problem: Phase II Progression Outcomes Goal: Progress activity as tolerated unless otherwise ordered Outcome: Completed/Met Date Met:  01/11/14     

## 2014-01-11 NOTE — Plan of Care (Signed)
Problem: Phase I Progression Outcomes Goal: Initial discharge plan identified Outcome: Completed/Met Date Met:  01/11/14 Goal: Hemodynamically stable Outcome: Progressing

## 2014-01-11 NOTE — Progress Notes (Signed)
Subjective:  Patient was seen and examined this morning. Patient is doing well from a medical standpoint. She feels well and states she slept well. Patient admits to anxiety, but denies headache, blurred vision, dizziness, nausea, vomiting, diarrhea, abdominal pain, heart palpitations, shortness of breath or confusion. Patient feels as if she does not need to be here.   Objective: Vital signs in last 24 hours: Filed Vitals:   01/10/14 1536 01/10/14 2140 01/11/14 0554 01/11/14 1305  BP: 111/69 107/67 122/72 111/75  Pulse: 86 66 69 65  Temp: 98.4 F (36.9 C) 98.3 F (36.8 C) 98.1 F (36.7 C) 98.4 F (36.9 C)  TempSrc: Oral Oral Oral Oral  Resp: 20 20 20 18   Height:      Weight:      SpO2: 99% 99% 98% 99%   Weight change:   Intake/Output Summary (Last 24 hours) at 01/11/14 1358 Last data filed at 01/11/14 0557  Gross per 24 hour  Intake    603 ml  Output      0 ml  Net    603 ml   General: Vital signs reviewed. Patient is well-developed and well-nourished, in no acute distress and cooperative with exam.  Eyes: EOMI, conjunctivae normal, no scleral icterus.  Cardiovascular: Regular rate, regular rhythm, S1 normal, S2 normal, no murmurs, gallops, or rubs. Pulmonary/Chest: Clear to auscultation bilaterally, no wheezes, rales, or rhonchi. Abdominal: Soft, non-tender, non-distended, BS +, no masses, organomegaly, or guarding present.  Extremities: No lower extremity edema bilaterally, pulses symmetric and intact bilaterally. No cyanosis or clubbing. Skin: Warm, dry and intact. No rashes or erythema. Psychiatric: Normal mood and affect. speech and behavior is normal. Cognition and memory are normal.   Lab Results: Basic Metabolic Panel:  Recent Labs Lab 01/09/14 1246  01/10/14 0530 01/11/14 0544  NA 140  < > 142 138  K 3.6*  < > 4.0 4.0  CL 103  < > 107 103  CO2 22  < > 23 25  GLUCOSE 82  < > 89 89  BUN 9  < > 9 7  CREATININE 0.69  < > 0.83 0.77  CALCIUM 9.2  < >  9.0 8.6  MG 2.1  --   --   --   < > = values in this interval not displayed. Liver Function Tests:  Recent Labs Lab 01/09/14 1246  AST 18  ALT 19  ALKPHOS 50  BILITOT 0.3  PROT 7.3  ALBUMIN 3.9   CBC:  Recent Labs Lab 01/09/14 1246 01/10/14 0530  WBC 8.4 5.9  HGB 14.1 12.9  HCT 40.0 38.3  MCV 86.4 88.7  PLT 239 221   Urine Drug Screen: Drugs of Abuse     Component Value Date/Time   LABOPIA NONE DETECTED 01/09/2014 1305   COCAINSCRNUR NONE DETECTED 01/09/2014 1305   LABBENZ POSITIVE* 01/09/2014 1305   AMPHETMU NONE DETECTED 01/09/2014 1305   THCU NONE DETECTED 01/09/2014 1305   LABBARB NONE DETECTED 01/09/2014 1305    Alcohol Level:  Recent Labs Lab 01/09/14 1246  ETH <11   Medications:  I have reviewed the patient's current medications. Prior to Admission:  Prescriptions prior to admission  Medication Sig Dispense Refill Last Dose  . citalopram (CELEXA) 40 MG tablet Take 40 mg by mouth daily.   01/09/2014 at Unknown time  . clonazePAM (KLONOPIN) 1 MG tablet Take 0.5-1 mg by mouth 2 (two) times daily as needed for anxiety (and panic attacks).   Past Week at Unknown time  .  etonogestrel-ethinyl estradiol (NUVARING) 0.12-0.015 MG/24HR vaginal ring Insert vaginally and leave in place for 3 consecutive weeks, then remove for 1 week. 9 each 3 Past Month at Unknown time  . fluconazole (DIFLUCAN) 150 MG tablet Take 1 tablet (150 mg total) by mouth once. (Patient not taking: Reported on 01/09/2014) 1 tablet 1 Taking   Scheduled Meds: . enoxaparin (LOVENOX) injection  40 mg Subcutaneous Q24H  . sodium chloride  3 mL Intravenous Q12H   Continuous Infusions:  PRN Meds:. Assessment/Plan: Principal Problem:   Selective serotonin re-uptake inhibitor overdose Active Problems:   Generalized anxiety disorder   Depression  Moderate SSRI Overdose: Patient did well overnight. She has no complaints. Her vital signs, labs, and physical exam were within normal limits. No  cardiac arrhythmias, seizure activity or QTc prolongation. I discussed this case with Dr. Elsie SaasJonnalagadda who recommends another day of medical observation (72 hours total) and possible inpatient psych admission after medical clearance. -Cardiac monitor -Neuro checks Q4H -Seizure precautions  -Suicide precautions -Sitter -Psychiatry following, appreciate recommendations  Generalized Anxiety and Depression: Patient started Celexa and Klonopin 2 months ago by Dr. Hyman HopesWebb. Patient scored 4/8 on SIG E CAPS.  -Hold Klonopin -Hold Celexa -Appreciate Psych recommendations -Follow up with Dr. Hyman HopesWebb  DVT/PE ppx: Lovenox daily  Dispo: Disposition is deferred at this time, awaiting improvement of current medical problems.  Anticipated discharge in approximately 1-2 day(s).   The patient does have a current PCP (No primary care provider on file.) and [facility] after discharge.  The patient does not have transportation limitations that hinder transportation to clinic appointments.  .Services Needed at time of discharge: Y = Yes, Blank = No PT:   OT:   RN:   Equipment:   Other:     LOS: 2 days   Jill AlexandersAlexa Richardson, DO PGY-1 Internal Medicine Resident Pager # [phone] 01/11/2014 1:58 PM

## 2014-01-12 DIAGNOSIS — K589 Irritable bowel syndrome without diarrhea: Secondary | ICD-10-CM | POA: Diagnosis not present

## 2014-01-12 DIAGNOSIS — T43222A Poisoning by selective serotonin reuptake inhibitors, intentional self-harm, initial encounter: Secondary | ICD-10-CM | POA: Diagnosis not present

## 2014-01-12 DIAGNOSIS — F411 Generalized anxiety disorder: Secondary | ICD-10-CM | POA: Diagnosis not present

## 2014-01-12 LAB — BASIC METABOLIC PANEL
Anion gap: 11 (ref 5–15)
BUN: 9 mg/dL (ref 6–23)
CHLORIDE: 104 meq/L (ref 96–112)
CO2: 24 mEq/L (ref 19–32)
Calcium: 8.8 mg/dL (ref 8.4–10.5)
Creatinine, Ser: 0.79 mg/dL (ref 0.50–1.10)
GLUCOSE: 89 mg/dL (ref 70–99)
Potassium: 4.1 mEq/L (ref 3.7–5.3)
SODIUM: 139 meq/L (ref 137–147)

## 2014-01-12 NOTE — Clinical Social Work Psych Note (Signed)
Per chart review, pt to be discharged home with outpatient psychiatric services.   Covering CSW contacted pt's RN to confirm discharge disposition. Per pt's RN, pt has been discharged home. Covering CSW signing off. Covering CSW to update Psych CSW on 01/13/2014.  Marcelline Deistmily Joyce Leckey, LCSWA [phone]) Licensed Clinical Social Worker Orthopedics [phone]([id number]) and Surgical [phone]([id number])

## 2014-01-12 NOTE — Discharge Instructions (Signed)
·   Thank you for allowing us to be involved in your healthcare while you were hospitalized at [facility].   Please note that there have been changes to your home medications.  --> PLEASE LOOK AT YOUR DISCHARGE MEDICATION LIST FOR DETAILS.   Please call your PCP if you have any questions or concerns, or any difficulty getting any of your medications.  Please return to the ER if you have worsening of your symptoms or new severe symptoms arise.   Please follow up with your counselor tomorrow and with Dr. Hyman HopesWebb. Please obtain psychiatric outpatient treatment as the combination of a psychiatrist and a counselor will be you best treatment option.  [facility] [address], [place], [place] [address] [phone]  [facility]  [address] [place], [place] [address] [phone]

## 2014-01-12 NOTE — Progress Notes (Signed)
Subjective:  Patient was seen and examined this morning. Patient slept well last night. She denied any symptoms of blurred vision, chest pain, heart palpitations, headache, dizziness, confusion, nausea or diarrhea. Patient had no seizure like activity. Patient explained that Dr. Shela CommonsJ was going to leave the decision up to her for outpatient or inpatient psychiatric treatment. Patient would like to talk to her counselor first, but is leaning toward outpatient treatment.   Objective: Vital signs in last 24 hours: Filed Vitals:   01/11/14 0554 01/11/14 1305 01/11/14 2232 01/12/14 0650  BP: 122/72 111/75 101/60 114/68  Pulse: 69 65 64 64  Temp: 98.1 F (36.7 C) 98.4 F (36.9 C) 98.3 F (36.8 C) 98.8 F (37.1 C)  TempSrc: Oral Oral Oral Oral  Resp: 20 18 16  0  Height:      Weight:      SpO2: 98% 99% 99% 99%   General: Vital signs reviewed. Patient is well-developed and well-nourished, in no acute distress and cooperative with exam.  Cardiovascular: Regular rate, regular rhythm, S1 normal, S2 normal, no murmurs, gallops, or rubs. Pulmonary/Chest: Clear to auscultation bilaterally, no wheezes, rales, or rhonchi. Abdominal: Soft, non-tender, non-distended, BS +, no masses, organomegaly, or guarding present.  Extremities: No lower extremity edema bilaterally, pulses symmetric and intact bilaterally. No cyanosis or clubbing. Skin: Warm, dry and intact. No rashes or erythema. Psychiatric: Normal mood and affect. speech and behavior is normal. Cognition and memory are normal.   Lab Results: Basic Metabolic Panel:  Recent Labs Lab 01/09/14 1246  01/11/14 0544 01/12/14 0513  NA 140  < > 138 139  K 3.6*  < > 4.0 4.1  CL 103  < > 103 104  CO2 22  < > 25 24  GLUCOSE 82  < > 89 89  BUN 9  < > 7 9  CREATININE 0.69  < > 0.77 0.79  CALCIUM 9.2  < > 8.6 8.8  MG 2.1  --   --   --   < > = values in this interval not displayed. Liver Function Tests:  Recent Labs Lab 01/09/14 1246  AST  18  ALT 19  ALKPHOS 50  BILITOT 0.3  PROT 7.3  ALBUMIN 3.9   CBC:  Recent Labs Lab 01/09/14 1246 01/10/14 0530  WBC 8.4 5.9  HGB 14.1 12.9  HCT 40.0 38.3  MCV 86.4 88.7  PLT 239 221   Urine Drug Screen: Drugs of Abuse     Component Value Date/Time   LABOPIA NONE DETECTED 01/09/2014 1305   COCAINSCRNUR NONE DETECTED 01/09/2014 1305   LABBENZ POSITIVE* 01/09/2014 1305   AMPHETMU NONE DETECTED 01/09/2014 1305   THCU NONE DETECTED 01/09/2014 1305   LABBARB NONE DETECTED 01/09/2014 1305    Alcohol Level:  Recent Labs Lab 01/09/14 1246  ETH <11   Medications:  I have reviewed the patient's current medications. Prior to Admission:  Prescriptions prior to admission  Medication Sig Dispense Refill Last Dose  . citalopram (CELEXA) 40 MG tablet Take 40 mg by mouth daily.   01/09/2014 at Unknown time  . clonazePAM (KLONOPIN) 1 MG tablet Take 0.5-1 mg by mouth 2 (two) times daily as needed for anxiety (and panic attacks).   Past Week at Unknown time  . etonogestrel-ethinyl estradiol (NUVARING) 0.12-0.015 MG/24HR vaginal ring Insert vaginally and leave in place for 3 consecutive weeks, then remove for 1 week. 9 each 3 Past Month at Unknown time  . fluconazole (DIFLUCAN) 150 MG tablet Take 1 tablet (  150 mg total) by mouth once. (Patient not taking: Reported on 01/09/2014) 1 tablet 1 Taking   Scheduled Meds: . enoxaparin (LOVENOX) injection  40 mg Subcutaneous Q24H  . sodium chloride  3 mL Intravenous Q12H   Continuous Infusions:  PRN Meds:. Assessment/Plan: Principal Problem:   Selective serotonin re-uptake inhibitor overdose Active Problems:   Generalized anxiety disorder   Depression  Moderate SSRI Overdose: Patient did well overnight. She has no complaints. Her vital signs, labs, and physical exam were within normal limits. No cardiac arrhythmias, seizure activity or QTc prolongation. Patient has reached 72 hours of medical observation without any signs or symptoms of  serotonin syndrome. Patient is cleared from a medical standpoint. I will discuss with patient and Dr. Shela CommonsJ as far as placement after discharge.  Ophelia Charter-Sitter -Psychiatry following, appreciate recommendations  Generalized Anxiety and Depression: Patient started Celexa and Klonopin 2 months ago by Dr. Hyman HopesWebb. Patient scored 4/8 on SIG E CAPS.  -Hold Klonopin -Hold Celexa -Appreciate Psych recommendations -Follow up with Dr. Hyman HopesWebb for further treatment recommendations  DVT/PE ppx: Lovenox daily  Dispo: Disposition is deferred at this time, awaiting improvement of current medical problems.  Anticipated discharge in approximately 1-2 day(s).   The patient does have a current PCP (No primary care provider on file.) and [facility] after discharge.  The patient does not have transportation limitations that hinder transportation to clinic appointments.  .Services Needed at time of discharge: Y = Yes, Blank = No PT:   OT:   RN:   Equipment:   Other:     LOS: 3 days   Jill AlexandersAlexa Richardson, DO PGY-1 Internal Medicine Resident Pager # [phone] 01/12/2014 8:55 AM

## 2014-01-12 NOTE — Progress Notes (Signed)
After discussion with Dr. Elsie SaasJonnalagadda, patient can decide whether or not she would prefer inpatient or outpatient psychiatric treatment. This should be discussed with her counselor. Patient discussed with her counselor and would like outpatient psychiatric follow up. Patient has an appointment with her counselor tomorrow and with Dr. Hyman HopesWebb soon. I have provided her with resources to follow up with for psychiatric help. Patient can also talk with Dr. Hyman HopesWebb and with her counselor about psychiatric treatment options. Patient and family are agreeable with plan. Patient denies any suicidal ideations. Patient is cleared from a medical standpoint after 72 hour observation.  Jill AlexandersAlexa Richardson, DO PGY-1 Internal Medicine Resident Pager # [phone] 01/12/2014 12:21 PM

## 2014-01-12 NOTE — Progress Notes (Signed)
Spoke Md. Senaida OresRichardson about patient's discharge home. According to Md. Senaida OresRichardson she was given the recommendation that the patient was cleared to discharge home. I informed Md. Senaida OresRichardson there is no note documenting he cleared the patient for discharge home and according to his last note on 01/10/14, he recommended inpatient psych facility.  She restated that the patient was cleared for discharge home instead of a inpatient psych facility.

## 2015-01-13 ENCOUNTER — Ambulatory Visit: Payer: Self-pay | Admitting: Women's Health

## 2015-01-30 ENCOUNTER — Encounter: Payer: Self-pay | Admitting: Women's Health

## 2015-01-30 ENCOUNTER — Ambulatory Visit (INDEPENDENT_AMBULATORY_CARE_PROVIDER_SITE_OTHER): Payer: 59 | Admitting: Women's Health

## 2015-01-30 VITALS — BP 124/80 | Ht 67.0 in | Wt 222.0 lb

## 2015-01-30 DIAGNOSIS — A499 Bacterial infection, unspecified: Secondary | ICD-10-CM | POA: Diagnosis not present

## 2015-01-30 DIAGNOSIS — N760 Acute vaginitis: Secondary | ICD-10-CM | POA: Diagnosis not present

## 2015-01-30 DIAGNOSIS — B9689 Other specified bacterial agents as the cause of diseases classified elsewhere: Secondary | ICD-10-CM

## 2015-01-30 DIAGNOSIS — Z3040 Encounter for surveillance of contraceptives, unspecified: Secondary | ICD-10-CM | POA: Diagnosis not present

## 2015-01-30 DIAGNOSIS — Z01419 Encounter for gynecological examination (general) (routine) without abnormal findings: Secondary | ICD-10-CM

## 2015-01-30 DIAGNOSIS — Z113 Encounter for screening for infections with a predominantly sexual mode of transmission: Secondary | ICD-10-CM | POA: Diagnosis not present

## 2015-01-30 DIAGNOSIS — N898 Other specified noninflammatory disorders of vagina: Secondary | ICD-10-CM | POA: Diagnosis not present

## 2015-01-30 LAB — WET PREP FOR TRICH, YEAST, CLUE
TRICH WET PREP: NONE SEEN
Yeast Wet Prep HPF POC: NONE SEEN

## 2015-01-30 MED ORDER — ETONOGESTREL-ETHINYL ESTRADIOL 0.12-0.015 MG/24HR VA RING: VAGINAL_RING | VAGINAL | Status: DC

## 2015-01-30 MED ORDER — METRONIDAZOLE 0.75 % VA GEL: VAGINAL | Status: DC

## 2015-01-30 NOTE — Patient Instructions (Signed)
Health Maintenance, Female Adopting a healthy lifestyle and getting preventive care can go a long way to promote health and wellness. Talk with your health care provider about what schedule of regular examinations is right for you. This is a good chance for you to check in with your provider about disease prevention and staying healthy. In between checkups, there are plenty of things you can do on your own. Experts have done a lot of research about which lifestyle changes and preventive measures are most likely to keep you healthy. Ask your health care provider for more information. WEIGHT AND DIET  Eat a healthy diet  Be sure to include plenty of vegetables, fruits, low-fat dairy products, and lean protein.  Do not eat a lot of foods high in solid fats, added sugars, or salt.  Get regular exercise. This is one of the most important things you can do for your health.  Most adults should exercise for at least 150 minutes each week. The exercise should increase your heart rate and make you sweat (moderate-intensity exercise).  Most adults should also do strengthening exercises at least twice a week. This is in addition to the moderate-intensity exercise.  Maintain a healthy weight  Body mass index (BMI) is a measurement that can be used to identify possible weight problems. It estimates body fat based on height and weight. Your health care provider can help determine your BMI and help you achieve or maintain a healthy weight.  For females 20 years of age and older:   A BMI below 18.5 is considered underweight.  A BMI of 18.5 to 24.9 is normal.  A BMI of 25 to 29.9 is considered overweight.  A BMI of 30 and above is considered obese.  Watch levels of cholesterol and blood lipids  You should start having your blood tested for lipids and cholesterol at 26 years of age, then have this test every 5 years.  You may need to have your cholesterol levels checked more often if:  Your lipid  or cholesterol levels are high.  You are older than 26 years of age.  You are at high risk for heart disease.  CANCER SCREENING   Lung Cancer  Lung cancer screening is recommended for adults 55-80 years old who are at high risk for lung cancer because of a history of smoking.  A yearly low-dose CT scan of the lungs is recommended for people who:  Currently smoke.  Have quit within the past 15 years.  Have at least a 30-pack-year history of smoking. A pack year is smoking an average of one pack of cigarettes a day for 1 year.  Yearly screening should continue until it has been 15 years since you quit.  Yearly screening should stop if you develop a health problem that would prevent you from having lung cancer treatment.  Breast Cancer  Practice breast self-awareness. This means understanding how your breasts normally appear and feel.  It also means doing regular breast self-exams. Let your health care provider know about any changes, no matter how small.  If you are in your 20s or 30s, you should have a clinical breast exam (CBE) by a health care provider every 1-3 years as part of a regular health exam.  If you are 40 or older, have a CBE every year. Also consider having a breast X-ray (mammogram) every year.  If you have a family history of breast cancer, talk to your health care provider about genetic screening.  If you   are at high risk for breast cancer, talk to your health care provider about having an MRI and a mammogram every year.  Breast cancer gene (BRCA) assessment is recommended for women who have family members with BRCA-related cancers. BRCA-related cancers include:  Breast.  Ovarian.  Tubal.  Peritoneal cancers.  Results of the assessment will determine the need for genetic counseling and BRCA1 and BRCA2 testing. Cervical Cancer Your health care provider may recommend that you be screened regularly for cancer of the pelvic organs (ovaries, uterus, and  vagina). This screening involves a pelvic examination, including checking for microscopic changes to the surface of your cervix (Pap test). You may be encouraged to have this screening done every 3 years, beginning at age 21.  For women ages 30-65, health care providers may recommend pelvic exams and Pap testing every 3 years, or they may recommend the Pap and pelvic exam, combined with testing for human papilloma virus (HPV), every 5 years. Some types of HPV increase your risk of cervical cancer. Testing for HPV may also be done on women of any age with unclear Pap test results.  Other health care providers may not recommend any screening for nonpregnant women who are considered low risk for pelvic cancer and who do not have symptoms. Ask your health care provider if a screening pelvic exam is right for you.  If you have had past treatment for cervical cancer or a condition that could lead to cancer, you need Pap tests and screening for cancer for at least 20 years after your treatment. If Pap tests have been discontinued, your risk factors (such as having a new sexual partner) need to be reassessed to determine if screening should resume. Some women have medical problems that increase the chance of getting cervical cancer. In these cases, your health care provider may recommend more frequent screening and Pap tests. Colorectal Cancer  This type of cancer can be detected and often prevented.  Routine colorectal cancer screening usually begins at 26 years of age and continues through 26 years of age.  Your health care provider may recommend screening at an earlier age if you have risk factors for colon cancer.  Your health care provider may also recommend using home test kits to check for hidden blood in the stool.  A small camera at the end of a tube can be used to examine your colon directly (sigmoidoscopy or colonoscopy). This is done to check for the earliest forms of colorectal  cancer.  Routine screening usually begins at age 50.  Direct examination of the colon should be repeated every 5-10 years through 26 years of age. However, you may need to be screened more often if early forms of precancerous polyps or small growths are found. Skin Cancer  Check your skin from head to toe regularly.  Tell your health care provider about any new moles or changes in moles, especially if there is a change in a mole's shape or color.  Also tell your health care provider if you have a mole that is larger than the size of a pencil eraser.  Always use sunscreen. Apply sunscreen liberally and repeatedly throughout the day.  Protect yourself by wearing long sleeves, pants, a wide-brimmed hat, and sunglasses whenever you are outside. HEART DISEASE, DIABETES, AND HIGH BLOOD PRESSURE   High blood pressure causes heart disease and increases the risk of stroke. High blood pressure is more likely to develop in:  People who have blood pressure in the high end   of the normal range (130-139/85-89 mm Hg).  People who are overweight or obese.  People who are African American.  If you are 38-23 years of age, have your blood pressure checked every 3-5 years. If you are 61 years of age or older, have your blood pressure checked every year. You should have your blood pressure measured twice--once when you are at a hospital or clinic, and once when you are not at a hospital or clinic. Record the average of the two measurements. To check your blood pressure when you are not at a hospital or clinic, you can use:  An automated blood pressure machine at a pharmacy.  A home blood pressure monitor.  If you are between 45 years and 39 years old, ask your health care provider if you should take aspirin to prevent strokes.  Have regular diabetes screenings. This involves taking a blood sample to check your fasting blood sugar level.  If you are at a normal weight and have a low risk for diabetes,  have this test once every three years after 26 years of age.  If you are overweight and have a high risk for diabetes, consider being tested at a younger age or more often. PREVENTING INFECTION  Hepatitis B  If you have a higher risk for hepatitis B, you should be screened for this virus. You are considered at high risk for hepatitis B if:  You were born in a country where hepatitis B is common. Ask your health care provider which countries are considered high risk.  Your parents were born in a high-risk country, and you have not been immunized against hepatitis B (hepatitis B vaccine).  You have HIV or AIDS.  You use needles to inject street drugs.  You live with someone who has hepatitis B.  You have had sex with someone who has hepatitis B.  You get hemodialysis treatment.  You take certain medicines for conditions, including cancer, organ transplantation, and autoimmune conditions. Hepatitis C  Blood testing is recommended for:  Everyone born from [birth year] through 1967.  Anyone with known risk factors for hepatitis C. Sexually transmitted infections (STIs)  You should be screened for sexually transmitted infections (STIs) including gonorrhea and chlamydia if:  You are sexually active and are younger than 26 years of age.  You are older than 26 years of age and your health care provider tells you that you are at risk for this type of infection.  Your sexual activity has changed since you were last screened and you are at an increased risk for chlamydia or gonorrhea. Ask your health care provider if you are at risk.  If you do not have HIV, but are at risk, it may be recommended that you take a prescription medicine daily to prevent HIV infection. This is called pre-exposure prophylaxis (PrEP). You are considered at risk if:  You are sexually active and do not regularly use condoms or know the HIV status of your partner(s).  You take drugs by injection.  You are sexually  active with a partner who has HIV. Talk with your health care provider about whether you are at high risk of being infected with HIV. If you choose to begin PrEP, you should first be tested for HIV. You should then be tested every 3 months for as long as you are taking PrEP.  PREGNANCY   If you are premenopausal and you may become pregnant, ask your health care provider about preconception counseling.  If you may  become pregnant, take 400 to 800 micrograms (mcg) of folic acid every day.  If you want to prevent pregnancy, talk to your health care provider about birth control (contraception). OSTEOPOROSIS AND MENOPAUSE   Osteoporosis is a disease in which the bones lose minerals and strength with aging. This can result in serious bone fractures. Your risk for osteoporosis can be identified using a bone density scan.  If you are 61 years of age or older, or if you are at risk for osteoporosis and fractures, ask your health care provider if you should be screened.  Ask your health care provider whether you should take a calcium or vitamin D supplement to lower your risk for osteoporosis.  Menopause may have certain physical symptoms and risks.  Hormone replacement therapy may reduce some of these symptoms and risks. Talk to your health care provider about whether hormone replacement therapy is right for you.  HOME CARE INSTRUCTIONS   Schedule regular health, dental, and eye exams.  Stay current with your immunizations.   Do not use any tobacco products including cigarettes, chewing tobacco, or electronic cigarettes.  If you are pregnant, do not drink alcohol.  If you are breastfeeding, limit how much and how often you drink alcohol.  Limit alcohol intake to no more than 1 drink per day for nonpregnant women. One drink equals 12 ounces of beer, 5 ounces of wine, or 1 ounces of hard liquor.  Do not use street drugs.  Do not share needles.  Ask your health care provider for help if  you need support or information about quitting drugs.  Tell your health care provider if you often feel depressed.  Tell your health care provider if you have ever been abused or do not feel safe at home.   This information is not intended to replace advice given to you by your health care provider. Make sure you discuss any questions you have with your health care provider.   Document Released: 09/03/2010 Document Revised: 03/11/2014 Document Reviewed: 01/20/2013 Elsevier Interactive Patient Education [employer].

## 2015-01-30 NOTE — Progress Notes (Signed)
Tara Sullivan 10/07/1990 [id number]    History:    Presents for annual exam.  Monthly cycles, NuvaRing in the past, currently not sexually active. Normal Paps, Gardasil series completed. History of IBS. History of positive chlamydia 4 with negative test of cure after. Anxiety and depression-counseling, 01/2014 failed drug overdose.  Past medical history, past surgical history, family history and social history were all reviewed and documented in the EPIC chart. Attending school for medical assisting.  ROS:  A ROS was performed and pertinent positives and negatives are included.  Exam:  Filed Vitals:   01/30/15 1505  BP: 124/80    General appearance:  Normal Thyroid:  Symmetrical, normal in size, without palpable masses or nodularity. Respiratory  Auscultation:  Clear without wheezing or rhonchi Cardiovascular  Auscultation:  Regular rate, without rubs, murmurs or gallops  Edema/varicosities:  Not grossly evident Abdominal  Soft,nontender, without masses, guarding or rebound.  Liver/spleen:  No organomegaly noted  Hernia:  None appreciated  Skin  Inspection:  Grossly normal   Breasts: Examined lying and sitting.     Right: Without masses, retractions, discharge or axillary adenopathy.     Left: Without masses, retractions, discharge or axillary adenopathy. Gentitourinary   Inguinal/mons:  Normal without inguinal adenopathy  External genitalia:  Normal  BUS/Urethra/Skene's glands:  Normal  Vagina:  Normal  Cervix:  Normal  Uterus:   normal in size, shape and contour.  Midline and mobile  Adnexa/parametria:     Rt: Without masses or tenderness.   Lt: Without masses or tenderness.  Anus and perineum: Normal  Digital rectal exam: Normal sphincter tone without palpated masses or tenderness  Assessment/Plan:  24 y.o. S WF G0  for annual exam.     Monthly cycle-contraception management Bacteria vaginosis STD screen Anxiety and depression-counseling Obesity  Plan:  Contraception options reviewed NuvaRing prescription, proper use given and reviewed slight risk for blood clots and strokes reviewed. Condoms encouraged until permanent partner. MetroGel vaginal cream 1 applicator at bedtime 5, prescription, proper use, alcohol precautions reviewed. SBE's, increasing exercise and decreasing calories for weight loss, calcium rich diet, MVI daily encouraged. Campus safety reviewed, reviewed importance of decreasing alcohol amounts. CBC, glucose, UA, Pap normal 2014, new screening guidelines reviewed. GC/Chlamydia, HIV, hep B, C, RPRHarrington Challenger.     YOUNG,NANCY J [facility] Hospital[facility]NP, 4:50 PM 01/30/2015

## 2015-01-31 LAB — HIV ANTIBODY (ROUTINE TESTING W REFLEX): HIV: NONREACTIVE

## 2015-01-31 LAB — URINALYSIS W MICROSCOPIC + REFLEX CULTURE
BILIRUBIN URINE: NEGATIVE
Bacteria, UA: NONE SEEN [HPF]
Casts: NONE SEEN [LPF]
Crystals: NONE SEEN [HPF]
GLUCOSE, UA: NEGATIVE
KETONES UR: NEGATIVE
LEUKOCYTES UA: NEGATIVE
Nitrite: NEGATIVE
PH: 7 (ref 5.0–8.0)
Protein, ur: NEGATIVE
SQUAMOUS EPITHELIAL / LPF: NONE SEEN [HPF] (ref <=5)
Specific Gravity, Urine: 1.015 (ref 1.001–1.035)
WBC, UA: NONE SEEN WBC/HPF (ref <=5)
Yeast: NONE SEEN [HPF]

## 2015-01-31 LAB — CBC WITH DIFFERENTIAL/PLATELET
BASOS PCT: 0 % (ref 0–1)
Basophils Absolute: 0 10*3/uL (ref 0.0–0.1)
Eosinophils Absolute: 0.1 10*3/uL (ref 0.0–0.7)
Eosinophils Relative: 1 % (ref 0–5)
HEMATOCRIT: 38.8 % (ref 36.0–46.0)
HEMOGLOBIN: 13.3 g/dL (ref 12.0–15.0)
LYMPHS ABS: 2.1 10*3/uL (ref 0.7–4.0)
Lymphocytes Relative: 24 % (ref 12–46)
MCH: 30.4 pg (ref 26.0–34.0)
MCHC: 34.3 g/dL (ref 30.0–36.0)
MCV: 88.8 fL (ref 78.0–100.0)
MONO ABS: 0.7 10*3/uL (ref 0.1–1.0)
MONOS PCT: 8 % (ref 3–12)
MPV: 10.3 fL (ref 8.6–12.4)
NEUTROS ABS: 6 10*3/uL (ref 1.7–7.7)
Neutrophils Relative %: 67 % (ref 43–77)
Platelets: 269 10*3/uL (ref 150–400)
RBC: 4.37 MIL/uL (ref 3.87–5.11)
RDW: 13.2 % (ref 11.5–15.5)
WBC: 8.9 10*3/uL (ref 4.0–10.5)

## 2015-01-31 LAB — HEPATITIS B SURFACE ANTIGEN: Hepatitis B Surface Ag: NEGATIVE

## 2015-01-31 LAB — GLUCOSE, RANDOM: GLUCOSE: 83 mg/dL (ref 65–99)

## 2015-01-31 LAB — RPR

## 2015-01-31 LAB — HEPATITIS C ANTIBODY: HCV Ab: NEGATIVE

## 2015-02-01 ENCOUNTER — Encounter: Payer: Self-pay | Admitting: Women's Health

## 2015-02-01 LAB — GC/CHLAMYDIA PROBE AMP
CT Probe RNA: NEGATIVE
GC Probe RNA: NEGATIVE

## 2015-02-01 LAB — URINE CULTURE: Colony Count: 25000

## 2016-02-02 ENCOUNTER — Ambulatory Visit (INDEPENDENT_AMBULATORY_CARE_PROVIDER_SITE_OTHER): Payer: BLUE CROSS/BLUE SHIELD | Admitting: Women's Health

## 2016-02-02 ENCOUNTER — Encounter: Payer: Self-pay | Admitting: Women's Health

## 2016-02-02 VITALS — BP 118/80 | Ht 67.0 in | Wt 224.0 lb

## 2016-02-02 DIAGNOSIS — Z01419 Encounter for gynecological examination (general) (routine) without abnormal findings: Secondary | ICD-10-CM

## 2016-02-02 DIAGNOSIS — Z3044 Encounter for surveillance of vaginal ring hormonal contraceptive device: Secondary | ICD-10-CM | POA: Diagnosis not present

## 2016-02-02 LAB — URINALYSIS W MICROSCOPIC + REFLEX CULTURE
Bilirubin Urine: NEGATIVE
CASTS: NONE SEEN [LPF]
Crystals: NONE SEEN [HPF]
Glucose, UA: NEGATIVE
HGB URINE DIPSTICK: NEGATIVE
KETONES UR: NEGATIVE
NITRITE: NEGATIVE
PH: 6.5 (ref 5.0–8.0)
PROTEIN: NEGATIVE
RBC / HPF: NONE SEEN RBC/HPF (ref <=2)
Specific Gravity, Urine: 1.015 (ref 1.001–1.035)
YEAST: NONE SEEN [HPF]

## 2016-02-02 MED ORDER — ETONOGESTREL-ETHINYL ESTRADIOL 0.12-0.015 MG/24HR VA RING: VAGINAL_RING | VAGINAL | 4 refills | Status: DC

## 2016-02-02 NOTE — Patient Instructions (Signed)

## 2016-02-02 NOTE — Progress Notes (Signed)
Tara Sullivan 01/24/1991 [id number]    History:    Presents for annual exam. Monthly cycle on NuvaRing. Has not been sexually active in greater than one year. Normal Pap history. Gardasil series completed. Anxiety and depression stable reports doing well, failed drug overdose 01/2014.Marland Kitchen. History of IBS no recent problems. History of positive chlamydia 4 with negative test of cure  Past medical history, past surgical history, family history and social history were all reviewed and documented in the EPIC chart. Recently completed [employer] school. Parents healthy.  ROS:  A ROS was performed and pertinent positives and negatives are included.  Exam:  Vitals:   02/02/16 0803  BP: 118/80  Weight: 224 lb (101.6 kg)  Height: 5\' 7"  (1.702 m)   Body mass index is 35.08 kg/m.   General appearance:  Normal Thyroid:  Symmetrical, normal in size, without palpable masses or nodularity. Respiratory  Auscultation:  Clear without wheezing or rhonchi Cardiovascular  Auscultation:  Regular rate, without rubs, murmurs or gallops  Edema/varicosities:  Not grossly evident Abdominal  Soft,nontender, without masses, guarding or rebound.  Liver/spleen:  No organomegaly noted  Hernia:  None appreciated  Skin  Inspection:  Grossly normal   Breasts: Examined lying and sitting.     Right: Without masses, retractions, discharge or axillary adenopathy.     Left: Without masses, retractions, discharge or axillary adenopathy. Gentitourinary   Inguinal/mons:  Normal without inguinal adenopathy  External genitalia:  Normal  BUS/Urethra/Skene's glands:  Normal  Vagina:  Normal  Cervix:  Normal  Uterus:   normal in size, shape and contour.  Midline and mobile  Adnexa/parametria:     Rt: Without masses or tenderness.   Lt: Without masses or tenderness.  Anus and perineum: Normal    Assessment/Plan:  25 y.o. S WF G0 for annual exam with no complaints.  Monthly cycle on NuvaRing History of anxiety and  depression-currently on no medication Obesity  And: NuvaRing prescription, proper use given and reviewed risk for blood clots and strokes. Condoms encouraged if sexually active. Denies need for STD screen. SBE's, exercise, calcium rich diet, MVI daily encouraged. Encouraged to decrease carbs, calories and diet for weight loss. CBC, UA, Pap. Pap normal 2014, new screening guidelines reviewed.    Harrington ChallengerYOUNG,Regine Christian J [facility]NP, 9:25 AM 02/02/2016

## 2016-02-04 LAB — URINE CULTURE

## 2016-02-06 LAB — PAP IG W/ RFLX HPV ASCU

## 2016-02-07 LAB — HUMAN PAPILLOMAVIRUS, HIGH RISK: HPV DNA High Risk: NOT DETECTED

## 2016-06-04 DIAGNOSIS — Z8719 Personal history of other diseases of the digestive system: Secondary | ICD-10-CM | POA: Diagnosis not present

## 2016-06-04 DIAGNOSIS — F411 Generalized anxiety disorder: Secondary | ICD-10-CM | POA: Diagnosis not present

## 2016-06-14 MED FILL — busPIRone HCL 5 MG TABS: 5 | 14 days supply | Qty: 28 | Fill #0

## 2016-06-18 MED FILL — busPIRone HCL 15 MG TABS: 15 | 90 days supply | Qty: 180 | Fill #0

## 2016-07-17 ENCOUNTER — Encounter: Payer: Self-pay | Admitting: Gynecology

## 2016-08-28 MED FILL — NUVARING VAGINAL RING: 0.12-0.015 | 84 days supply | Qty: 3 | Fill #0

## 2016-09-05 DIAGNOSIS — F331 Major depressive disorder, recurrent, moderate: Secondary | ICD-10-CM | POA: Diagnosis not present

## 2016-09-18 MED FILL — BUPROPION HCL XL 150 MG TAB: 150 | 90 days supply | Qty: 90 | Fill #0

## 2016-09-18 MED FILL — busPIRone HCL 30 MG TABS: 30 | 30 days supply | Qty: 60 | Fill #0

## 2016-09-20 DIAGNOSIS — H5213 Myopia, bilateral: Secondary | ICD-10-CM | POA: Diagnosis not present

## 2016-09-20 DIAGNOSIS — H52223 Regular astigmatism, bilateral: Secondary | ICD-10-CM | POA: Diagnosis not present

## 2016-10-04 ENCOUNTER — Encounter: Payer: Self-pay | Admitting: Physician Assistant

## 2016-10-04 ENCOUNTER — Ambulatory Visit (INDEPENDENT_AMBULATORY_CARE_PROVIDER_SITE_OTHER): Payer: 59 | Admitting: Physician Assistant

## 2016-10-04 VITALS — BP 116/82 | HR 88 | Temp 98.4°F | Resp 18 | Ht 67.0 in | Wt 223.0 lb

## 2016-10-04 DIAGNOSIS — G4710 Hypersomnia, unspecified: Secondary | ICD-10-CM

## 2016-10-04 DIAGNOSIS — F329 Major depressive disorder, single episode, unspecified: Secondary | ICD-10-CM

## 2016-10-04 MED ORDER — DESVENLAFAXINE SUCCINATE ER 25 MG PO TB24: 25.0000 mg | ORAL_TABLET | Freq: Every day | ORAL | 0 refills | Status: DC

## 2016-10-04 MED ORDER — DESVENLAFAXINE SUCCINATE ER 50 MG PO TB24: 50.0000 mg | ORAL_TABLET | Freq: Every day | ORAL | 3 refills | Status: DC

## 2016-10-04 MED FILL — DESVENLAFAXINE SUC ER 25 MG: 25 | 30 days supply | Qty: 30 | Fill #0
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
## 2016-10-04 NOTE — Patient Instructions (Signed)
     IF you received an x-ray today, you will receive an invoice from [facility]. Please contact [facility] at [phone] with questions or concerns regarding your invoice.   IF you received labwork today, you will receive an invoice from [facility]. Please contact [facility] at [phone] with questions or concerns regarding your invoice.   Our billing staff will not be able to assist you with questions regarding bills from these companies.  You will be contacted with the lab results as soon as they are available. The fastest way to get your results is to activate your My Chart account. Instructions are located on the last page of this paperwork. If you have not heard from us regarding the results in 2 weeks, please contact this office.     

## 2016-10-21 MED FILL — busPIRone HCL 30 MG TABS: 30 | 30 days supply | Qty: 60 | Fill #1

## 2016-10-25 MED FILL — DESVENLAFAXINE SUC ER 50 MG: 50 | 30 days supply | Qty: 30 | Fill #0

## 2016-10-31 ENCOUNTER — Telehealth: Payer: Self-pay | Admitting: Physician Assistant

## 2016-10-31 NOTE — Telephone Encounter (Signed)
She is researching medications she has tried in the past.  She has been on the Pristiq for 3 weeks and she is tolerating ok but she feels like her anxiety is really high and she is having trouble sleeping.  She has Klonopin for panic attacks but she does not want to take at night to sleep because she does not want them to stop working for her panic attacks and she really does not want to be on that medication daily.  Atarax - no help Many SSRIs that caused her to feel sick JordanLatuda - worked well

## 2016-11-05 ENCOUNTER — Other Ambulatory Visit: Payer: Self-pay

## 2016-11-05 ENCOUNTER — Other Ambulatory Visit: Payer: Self-pay | Admitting: Physician Assistant

## 2016-11-05 MED ORDER — LURASIDONE HCL 40 MG PO TABS: 40.0000 mg | ORAL_TABLET | Freq: Every day | ORAL | 0 refills | Status: DC

## 2016-11-05 MED FILL — NUVARING VAGINAL RING: 0.12-0.015 | 84 days supply | Qty: 3 | Fill #1
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
## 2016-11-07 ENCOUNTER — Telehealth: Payer: Self-pay

## 2016-11-07 DIAGNOSIS — F329 Major depressive disorder, single episode, unspecified: Secondary | ICD-10-CM

## 2016-11-07 DIAGNOSIS — F39 Unspecified mood [affective] disorder: Secondary | ICD-10-CM

## 2016-11-07 NOTE — Telephone Encounter (Signed)
It would be helpful if she could be sooner than that.  She is actively having increased depression symptoms in the middle of medication adjustment.

## 2016-11-07 NOTE — Telephone Encounter (Signed)
Patient left message with office requesting referral to see Berniece AndreasJulie Whitt. Patient needs approval from provider to be seen sooner, as office has no appointments until 12/02/16. Message routed to provider at this time./ S.Maks Cavallero,CMA

## 2016-11-07 NOTE — Telephone Encounter (Signed)
Please send a referral

## 2016-11-08 NOTE — Telephone Encounter (Signed)
I do not see referral placed. Please place referral and I will be happy to send out. Thanks!

## 2016-11-12 MED ORDER — DESVENLAFAXINE SUCCINATE ER 100 MG PO TB24: 100.0000 mg | ORAL_TABLET | Freq: Every day | ORAL | 0 refills | Status: DC

## 2016-11-12 NOTE — Telephone Encounter (Signed)
Referral sent via epic wq. Spoke with [facility] office for Tara AndreasJulie Whitt to verify they schedule referrals from [facility] and they do. They did say they are scheduling in November. Let me know if there is anything else I can do or if we need to try something else for a sooner appt. Thanks!

## 2016-11-12 NOTE — Telephone Encounter (Signed)
Can we try Tara Sullivan

## 2016-11-12 NOTE — Addendum Note (Signed)
Addended by: Morrell RiddleWEBER, SARAH L on: 11/12/2016 02:53 PM   Modules accepted: Orders

## 2016-11-14 ENCOUNTER — Telehealth: Payer: Self-pay

## 2016-11-14 DIAGNOSIS — A64 Unspecified sexually transmitted disease: Secondary | ICD-10-CM

## 2016-11-14 MED FILL — DESVENLAFAXINE SUC ER 100 M: 100 | 90 days supply | Qty: 90 | Fill #0

## 2016-11-14 NOTE — Telephone Encounter (Signed)
Done on 9/11.
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
## 2016-11-22 ENCOUNTER — Other Ambulatory Visit: Payer: Self-pay | Admitting: Physician Assistant

## 2016-11-22 ENCOUNTER — Encounter: Payer: Self-pay | Admitting: Family Medicine

## 2016-11-22 ENCOUNTER — Ambulatory Visit (INDEPENDENT_AMBULATORY_CARE_PROVIDER_SITE_OTHER): Payer: 59 | Admitting: Family Medicine

## 2016-11-22 VITALS — BP 114/81 | HR 102 | Temp 98.1°F | Resp 16 | Ht 67.32 in | Wt 223.0 lb

## 2016-11-22 DIAGNOSIS — R197 Diarrhea, unspecified: Secondary | ICD-10-CM

## 2016-11-22 DIAGNOSIS — E860 Dehydration: Secondary | ICD-10-CM

## 2016-11-22 MED ORDER — QUETIAPINE FUMARATE 50 MG PO TABS: 50.0000 mg | ORAL_TABLET | Freq: Every day | ORAL | 0 refills | Status: DC

## 2016-11-22 MED ORDER — SODIUM CHLORIDE 0.9 % IV BOLUS (SEPSIS)
1000.0000 mL | Freq: Once | INTRAVENOUS | Status: AC
  Administered 2016-11-22: 1000 mL via INTRAVENOUS

## 2016-11-22 MED FILL — QUETIAPINE FUMARATE 50 MG T: 50 | 30 days supply | Qty: 30 | Fill #0

## 2016-11-22 NOTE — Progress Notes (Signed)
Pt is having a really hard time with her depression.  She has been on the Pristiq  for about 3 weeks and she does not feel like it is helping.  She has been on lutuda in the past and it really helped a lot but her insurance will not currently cover that medication.  She is not sleeping at night unless she drinks ETOH.  She has recently started to self harm.  We will try seroquel as that is one of the medications that she has to try prior to getting approved for lutuda.

## 2016-11-22 NOTE — Patient Instructions (Addendum)
     IF you received an x-ray today, you will receive an invoice from [facility]. Please contact [facility] at [phone] with questions or concerns regarding your invoice.   IF you received labwork today, you will receive an invoice from [facility]. Please contact [facility] at [phone] with questions or concerns regarding your invoice.   Our billing staff will not be able to assist you with questions regarding bills from these companies.  You will be contacted with the lab results as soon as they are available. The fastest way to get your results is to activate your My Chart account. Instructions are located on the last page of this paperwork. If you have not heard from us regarding the results in 2 weeks, please contact this office.      Bland Diet A bland diet consists of foods that do not have a lot of fat or fiber. Foods without fat or fiber are easier for the body to digest. They are also less likely to irritate your mouth, throat, stomach, and other parts of your gastrointestinal tract. A bland diet is sometimes called a BRAT diet. What is my plan? Your health care provider or dietitian may recommend specific changes to your diet to prevent and treat your symptoms, such as:  Eating small meals often.  Cooking food until it is soft enough to chew easily.  Chewing your food well.  Drinking fluids slowly.  Not eating foods that are very spicy, sour, or fatty.  Not eating citrus fruits, such as oranges and grapefruit.  What do I need to know about this diet?  Eat a variety of foods from the bland diet food list.  Do not follow a bland diet longer than you have to.  Ask your health care provider whether you should take vitamins. What foods can I eat? Grains  Hot cereals, such as cream of wheat. Bread, crackers, or tortillas made from refined white flour. Rice. Vegetables Canned or cooked vegetables. Mashed or boiled potatoes. Fruits Bananas.  Applesauce. Other types of cooked or canned fruit with the skin and seeds removed, such as canned peaches or pears. Meats and Other Protein Sources Scrambled eggs. Creamy peanut butter or other nut butters. Lean, well-cooked meats, such as chicken or fish. Tofu. Soups or broths. Dairy Low-fat dairy products, such as milk, cottage cheese, or yogurt. Beverages Water. Herbal tea. Apple juice. Sweets and Desserts Pudding. Custard. Fruit gelatin. Ice cream. Fats and Oils Mild salad dressings. Canola or olive oil. The items listed above may not be a complete list of allowed foods or beverages. Contact your dietitian for more options. What foods are not recommended? Foods and ingredients that are often not recommended include:  Spicy foods, such as hot sauce or salsa.  Fried foods.  Sour foods, such as pickled or fermented foods.  Raw vegetables or fruits, especially citrus or berries.  Caffeinated drinks.  Alcohol.  Strongly flavored seasonings or condiments.  The items listed above may not be a complete list of foods and beverages that are not allowed. Contact your dietitian for more information. This information is not intended to replace advice given to you by your health care provider. Make sure you discuss any questions you have with your health care provider. Document Released: 06/12/2015 Document Revised: 07/27/2015 Document Reviewed: 03/02/2014 Elsevier Interactive Patient Education  2018 Elsevier Inc.  

## 2016-11-22 NOTE — Progress Notes (Signed)
9/21/201810:00 AM  Tara Sullivan 13-Oct-1990, 26 y.o. female [id number]  Chief Complaint  Patient presents with  . Diarrhea  . Fatigue  . Dizziness    HPI:   Patient is a 26 y.o. female with who presents today for 2 days of copious watery diarrhea, with one episode of incontinence. Has had about 8 episodes since it started yesterday afternoon. Denies blood in stool. Denies fever or chills. Has diffuse cramping abdominal pain. Denies nausea or vomiting. Has been able to eat and drink. Reports normal UOP. However, she feels very weak and dizzy and has walked into the wall 3 times already.  She denies any travel, new foods, sick contacts at home.  Depression screen [facility] 2/9 11/22/2016  Decreased Interest 0  Down, Depressed, Hopeless 0  PHQ - 2 Score 0    Allergies  Allergen Reactions  . Latex     Swelling and burning of skin    Current Outpatient Prescriptions on File Prior to Visit  Medication Sig Dispense Refill  . busPIRone (BUSPAR) 30 MG tablet Take 30 mg by mouth 2 (two) times daily.    . clonazePAM (KLONOPIN) 1 MG tablet Take 1 mg by mouth 2 (two) times daily.    Marland Kitchen desvenlafaxine (PRISTIQ) 100 MG 24 hr tablet Take 1 tablet (100 mg total) by mouth daily. 90 tablet 0  . Desvenlafaxine Succinate ER (PRISTIQ) 25 MG TB24 Take 25 mg by mouth daily. 30 tablet 0  . etonogestrel-ethinyl estradiol (NUVARING) 0.12-0.015 MG/24HR vaginal ring Insert vaginally and leave in place for 3 consecutive weeks, then remove for 1 week. 3 each 4  . lurasidone (LATUDA) 40 MG TABS tablet Take 1 tablet (40 mg total) by mouth daily with breakfast. 30 tablet 0  . QUEtiapine (SEROQUEL) 50 MG tablet Take 1 tablet (50 mg total) by mouth at bedtime. On an empty stomach 30 tablet 0   No current facility-administered medications on file prior to visit.     Past Medical History:  Diagnosis Date  . Anxiety   . Depression   . Irregular menses     Past Surgical History:  Procedure Laterality Date  .  KNEE SURGERY  01/2009   RIGHT KNEE    Social History  Substance Use Topics  . Smoking status: Never Smoker  . Smokeless tobacco: Never Used  . Alcohol use Yes     Comment: occ    Family History  Problem Relation Age of Onset  . Hypertension Maternal Grandfather   . Hypertension Paternal Grandmother   . Diabetes Paternal Grandmother   . Alcoholism Father   . Bipolar disorder Sister   . Bipolar disorder Brother     Review of Systems  Constitutional: Positive for malaise/fatigue. Negative for chills and fever.  HENT: Negative for congestion, ear pain, sinus pain and sore throat.   Respiratory: Negative for cough and shortness of breath.   Cardiovascular: Negative for chest pain and palpitations.  Gastrointestinal: Positive for abdominal pain and diarrhea. Negative for blood in stool, melena, nausea and vomiting.  Neurological: Positive for weakness.     OBJECTIVE:  Blood pressure 114/81, pulse (!) 102, temperature 98.1 F (36.7 C), temperature source Oral, resp. rate 16, height 5' 7.32" (1.71 m), weight 223 lb (101.2 kg), SpO2 97 %.  Physical Exam  Constitutional: She is oriented to person, place, and time and well-developed, well-nourished, and in no distress.  HENT:  Head: Normocephalic and atraumatic.  Mouth/Throat: Oropharynx is clear and moist. No oropharyngeal exudate.  Eyes:  Pupils are equal, round, and reactive to light. EOM are normal. No scleral icterus.  Neck: Neck supple.  Cardiovascular: Normal rate, regular rhythm and normal heart sounds.  Exam reveals no gallop and no friction rub.   No murmur heard. Pulmonary/Chest: Effort normal and breath sounds normal. She has no wheezes. She has no rales.  Abdominal: Soft. Bowel sounds are normal. She exhibits no distension and no mass. There is tenderness (diffuse). There is no rebound and no guarding.  Musculoskeletal: She exhibits no edema.  Neurological: She is alert and oriented to person, place, and time.    Skin: Skin is warm and dry. There is pallor.    ASSESSMENT and PLAN:  1. Diarrhea of presumed infectious origin Discussed BRAT diet, RTC precautions.  - Insert peripheral IV - sodium chloride 0.9 % bolus 1,000 mL; Inject 1,000 mLs into the vein once.  2. Dehydration - Insert peripheral IV - sodium chloride 0.9 % bolus 1,000 mL; Inject 1,000 mLs into the vein once.       Myles Lipps, MD Primary Care at [facility] [address] [place], [place] [address] Ph.  [phone] Fax [phone]

## 2016-11-26 MED FILL — busPIRone HCL 30 MG TABS: 30 | 30 days supply | Qty: 60 | Fill #2 | Status: TO

## 2016-11-28 ENCOUNTER — Ambulatory Visit (INDEPENDENT_AMBULATORY_CARE_PROVIDER_SITE_OTHER): Payer: 59 | Admitting: Psychology

## 2016-11-28 DIAGNOSIS — F331 Major depressive disorder, recurrent, moderate: Secondary | ICD-10-CM | POA: Diagnosis not present

## 2016-11-30 ENCOUNTER — Ambulatory Visit: Payer: 59 | Admitting: Physician Assistant

## 2016-12-03 ENCOUNTER — Telehealth: Payer: Self-pay

## 2016-12-03 NOTE — Telephone Encounter (Signed)
Filled out PA for Latuda by phone today per request.  ([phone])  Patient has tried and failed seroquel (no Help, overly sleepy), Geodon,(dizzy), and abilify (headache).  PA# is [id number] and a result should be reached within 24-72 hrs.

## 2016-12-05 ENCOUNTER — Encounter: Payer: Self-pay | Admitting: Physician Assistant

## 2016-12-05 DIAGNOSIS — F329 Major depressive disorder, single episode, unspecified: Secondary | ICD-10-CM

## 2016-12-06 MED ORDER — DESVENLAFAXINE SUCCINATE ER 25 MG PO TB24
25.0000 mg | ORAL_TABLET | Freq: Every day | ORAL | 0 refills | Status: DC
Start: 2016-12-06 — End: 2017-02-15

## 2016-12-06 MED FILL — DESVENLAFAXINE SUC ER 25 MG: 25 | 30 days supply | Qty: 30 | Fill #0

## 2016-12-06 NOTE — Telephone Encounter (Signed)
Tara Sullivan has been approved.  We will taper off of Pristiq and start Latuda at a low dose and move up as needed to be done in the past.

## 2016-12-06 NOTE — Telephone Encounter (Signed)
Received letter of approval for Latuda.  Sarah and patient notified.

## 2016-12-09 MED FILL — LATUDA 40 MG TABLET: 40 | 30 days supply | Qty: 30 | Fill #0

## 2016-12-11 ENCOUNTER — Ambulatory Visit (INDEPENDENT_AMBULATORY_CARE_PROVIDER_SITE_OTHER): Payer: 59 | Admitting: Psychology

## 2016-12-11 DIAGNOSIS — F331 Major depressive disorder, recurrent, moderate: Secondary | ICD-10-CM

## 2016-12-20 ENCOUNTER — Ambulatory Visit: Payer: 59 | Admitting: Physician Assistant

## 2016-12-23 ENCOUNTER — Other Ambulatory Visit: Payer: Self-pay | Admitting: Physician Assistant

## 2016-12-23 DIAGNOSIS — B379 Candidiasis, unspecified: Secondary | ICD-10-CM

## 2016-12-23 MED ORDER — FLUCONAZOLE 150 MG PO TABS: 150.0000 mg | ORAL_TABLET | Freq: Once | ORAL | 0 refills | Status: AC

## 2016-12-23 MED FILL — FLUCONAZOLE 150 MG TABLET: 150 | 2 days supply | Qty: 2 | Fill #0

## 2016-12-23 NOTE — Progress Notes (Unsigned)
Patient reports vaginal discharge and pruritic changes.  She notes that she has had this many times.  This occurrence has been for several days.  She has taken the diflucan prior.  Wet prep obtained, with yeast apparent.  Given diflucan and discussed use.

## 2016-12-25 ENCOUNTER — Ambulatory Visit (INDEPENDENT_AMBULATORY_CARE_PROVIDER_SITE_OTHER): Payer: 59 | Admitting: Psychology

## 2016-12-25 ENCOUNTER — Ambulatory Visit: Payer: 59 | Admitting: Physician Assistant

## 2016-12-25 DIAGNOSIS — F3162 Bipolar disorder, current episode mixed, moderate: Secondary | ICD-10-CM | POA: Diagnosis not present

## 2016-12-25 DIAGNOSIS — F331 Major depressive disorder, recurrent, moderate: Secondary | ICD-10-CM

## 2016-12-25 MED FILL — NALTREXONE 50 MG TABLET: 50 | 30 days supply | Qty: 30 | Fill #0

## 2016-12-25 MED FILL — LATUDA 60 MG TABLET: 60 | 30 days supply | Qty: 30 | Fill #0

## 2016-12-25 MED FILL — busPIRone HCL 30 MG TABS: 30 | 30 days supply | Qty: 60 | Fill #0

## 2016-12-31 ENCOUNTER — Ambulatory Visit (INDEPENDENT_AMBULATORY_CARE_PROVIDER_SITE_OTHER): Payer: 59 | Admitting: Psychology

## 2016-12-31 DIAGNOSIS — F331 Major depressive disorder, recurrent, moderate: Secondary | ICD-10-CM | POA: Diagnosis not present

## 2017-01-01 ENCOUNTER — Encounter: Payer: Self-pay | Admitting: Physician Assistant

## 2017-01-01 ENCOUNTER — Ambulatory Visit (INDEPENDENT_AMBULATORY_CARE_PROVIDER_SITE_OTHER): Payer: 59 | Admitting: Physician Assistant

## 2017-01-01 VITALS — BP 98/66 | HR 104 | Temp 98.1°F | Resp 18 | Ht 67.32 in | Wt 223.4 lb

## 2017-01-01 DIAGNOSIS — Z113 Encounter for screening for infections with a predominantly sexual mode of transmission: Secondary | ICD-10-CM | POA: Diagnosis not present

## 2017-01-01 DIAGNOSIS — Z79899 Other long term (current) drug therapy: Secondary | ICD-10-CM | POA: Diagnosis not present

## 2017-01-01 DIAGNOSIS — G5712 Meralgia paresthetica, left lower limb: Secondary | ICD-10-CM | POA: Diagnosis not present

## 2017-01-01 DIAGNOSIS — E669 Obesity, unspecified: Secondary | ICD-10-CM | POA: Insufficient documentation
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
## 2017-01-02 LAB — COMPREHENSIVE METABOLIC PANEL
A/G RATIO: 1.3 (ref 1.2–2.2)
ALK PHOS: 62 IU/L (ref 39–117)
ALT: 12 IU/L (ref 0–32)
AST: 15 IU/L (ref 0–40)
Albumin: 4 g/dL (ref 3.5–5.5)
BUN / CREAT RATIO: 9 (ref 9–23)
BUN: 8 mg/dL (ref 6–20)
Bilirubin Total: 0.2 mg/dL (ref 0.0–1.2)
CO2: 19 mmol/L — AB (ref 20–29)
Calcium: 9.5 mg/dL (ref 8.7–10.2)
Chloride: 104 mmol/L (ref 96–106)
Creatinine, Ser: 0.85 mg/dL (ref 0.57–1.00)
GFR calc Af Amer: 108 mL/min/{1.73_m2} (ref >59)
GFR calc non Af Amer: 94 mL/min/{1.73_m2} (ref >59)
Globulin, Total: 3.1 g/dL (ref 1.5–4.5)
Glucose: 105 mg/dL — ABNORMAL HIGH (ref 65–99)
POTASSIUM: 4.4 mmol/L (ref 3.5–5.2)
Sodium: 142 mmol/L (ref 134–144)
Total Protein: 7.1 g/dL (ref 6.0–8.5)

## 2017-01-02 LAB — HEPATITIS C ANTIBODY

## 2017-01-02 LAB — LIPID PANEL
Chol/HDL Ratio: 2.2 ratio (ref 0.0–4.4)
Cholesterol, Total: 158 mg/dL (ref 100–199)
HDL: 71 mg/dL (ref >39)
LDL CALC: 52 mg/dL (ref 0–99)
Triglycerides: 176 mg/dL — ABNORMAL HIGH (ref 0–149)
VLDL Cholesterol Cal: 35 mg/dL (ref 5–40)

## 2017-01-02 LAB — HEMOGLOBIN A1C
ESTIMATED AVERAGE GLUCOSE: 100 mg/dL
Hgb A1c MFr Bld: 5.1 % (ref 4.8–5.6)

## 2017-01-02 LAB — HIV ANTIBODY (ROUTINE TESTING W REFLEX): HIV Screen 4th Generation wRfx: NONREACTIVE

## 2017-01-02 LAB — RPR: RPR Ser Ql: NONREACTIVE

## 2017-01-03 LAB — GC/CHLAMYDIA PROBE AMP
CHLAMYDIA, DNA PROBE: NEGATIVE
Neisseria gonorrhoeae by PCR: NEGATIVE

## 2017-01-09 LAB — TRICHOMONAS VAGINALIS, PROBE AMP: Trich vag by NAA: NEGATIVE

## 2017-01-22 ENCOUNTER — Ambulatory Visit (INDEPENDENT_AMBULATORY_CARE_PROVIDER_SITE_OTHER): Payer: 59 | Admitting: Psychology

## 2017-01-22 DIAGNOSIS — F331 Major depressive disorder, recurrent, moderate: Secondary | ICD-10-CM

## 2017-01-28 MED FILL — LATUDA 60 MG TABLET: 60 | 30 days supply | Qty: 30 | Fill #1

## 2017-01-29 ENCOUNTER — Ambulatory Visit: Payer: 59 | Admitting: Psychology

## 2017-01-31 DIAGNOSIS — F3162 Bipolar disorder, current episode mixed, moderate: Secondary | ICD-10-CM | POA: Diagnosis not present

## 2017-01-31 MED FILL — busPIRone HCL 30 MG TABS: 30 | 30 days supply | Qty: 60 | Fill #1

## 2017-01-31 MED FILL — LITHIUM CARBONATE 300 MG CA: 300 | 30 days supply | Qty: 60 | Fill #0

## 2017-02-04 ENCOUNTER — Other Ambulatory Visit: Payer: Self-pay | Admitting: Physician Assistant

## 2017-02-04 DIAGNOSIS — Z79899 Other long term (current) drug therapy: Secondary | ICD-10-CM

## 2017-02-05 ENCOUNTER — Ambulatory Visit: Payer: 59 | Admitting: Psychology

## 2017-02-05 DIAGNOSIS — F331 Major depressive disorder, recurrent, moderate: Secondary | ICD-10-CM | POA: Diagnosis not present

## 2017-02-07 ENCOUNTER — Ambulatory Visit (INDEPENDENT_AMBULATORY_CARE_PROVIDER_SITE_OTHER): Payer: 59 | Admitting: Family Medicine

## 2017-02-07 DIAGNOSIS — Z79899 Other long term (current) drug therapy: Secondary | ICD-10-CM | POA: Diagnosis not present

## 2017-02-08 LAB — LITHIUM LEVEL: LITHIUM LVL: 0.5 mmol/L — AB (ref 0.6–1.2)

## 2017-02-08 LAB — TSH: TSH: 3.36 u[IU]/mL (ref 0.450–4.500)

## 2017-02-08 NOTE — Progress Notes (Signed)
Lab visit only. 

## 2017-02-12 ENCOUNTER — Ambulatory Visit: Payer: 59 | Admitting: Psychology

## 2017-02-12 DIAGNOSIS — F331 Major depressive disorder, recurrent, moderate: Secondary | ICD-10-CM | POA: Diagnosis not present

## 2017-02-15 ENCOUNTER — Ambulatory Visit (INDEPENDENT_AMBULATORY_CARE_PROVIDER_SITE_OTHER): Payer: 59

## 2017-02-15 ENCOUNTER — Other Ambulatory Visit: Payer: Self-pay

## 2017-02-15 ENCOUNTER — Encounter: Payer: Self-pay | Admitting: Family Medicine

## 2017-02-15 ENCOUNTER — Ambulatory Visit (INDEPENDENT_AMBULATORY_CARE_PROVIDER_SITE_OTHER): Payer: 59 | Admitting: Family Medicine

## 2017-02-15 VITALS — BP 122/78 | HR 90 | Temp 98.1°F | Wt 236.0 lb

## 2017-02-15 DIAGNOSIS — R071 Chest pain on breathing: Secondary | ICD-10-CM

## 2017-02-15 DIAGNOSIS — R079 Chest pain, unspecified: Secondary | ICD-10-CM | POA: Diagnosis not present

## 2017-02-15 MED ORDER — OMEPRAZOLE 20 MG PO CPDR: 20.0000 mg | DELAYED_RELEASE_CAPSULE | Freq: Two times a day (BID) | ORAL | 1 refills | Status: DC

## 2017-02-15 NOTE — Progress Notes (Signed)
12/15/20188:23 AM  Alroy DustElena Mintzer 01/31/1991, 26 y.o. female [id number]  Chief Complaint  Patient presents with  . Chest Pain    for the past 2 mos    HPI:   Patient is a 26 y.o. female who presents today for 2 months of mid sternal chest pain, that sometime radiates to the left. Describes the pain as intermittent, random, happens at rest, sometimes after eating, last for about 20-30 minutes, sharp, feels that breathing makes it worse when she has it, but does not have SOB, palpitations, nausea, diaphoresis. Has not taken anything for it, just waits for it to self resolve. Does not smoke, no recent illnesses, no fhx CAD, denies heartburn, joint pain/swelling.   [facility] screen [facility]9 02/15/2017 01/01/2017 11/22/2016  Decreased Interest 0 0 0  Down, Depressed, Hopeless 0 0 0  PHQ - 2 Score 0 0 0    Allergies  Allergen Reactions  . Latex     Swelling and burning of skin    Prior to Admission medications   Medication Sig Start Date End Date Taking? Authorizing Provider  busPIRone (BUSPAR) 30 MG tablet Take 30 mg by mouth 2 (two) times daily.   Yes [provider]  clonazePAM (KLONOPIN) 1 MG tablet Take 1 mg by mouth 2 (two) times daily.   Yes [provider]  etonogestrel-ethinyl estradiol (NUVARING) 0.12-0.015 MG/24HR vaginal ring Insert vaginally and leave in place for 3 consecutive weeks, then remove for 1 week. 02/02/16  Yes Harrington ChallengerYoung, Nancy J, NP  LATUDA 60 MG TABS Take 60 mg by mouth daily. 12/25/16  Yes [provider]  lithium carbonate 300 MG capsule Take 300 mg by mouth 2 (two) times daily with a meal.   Yes [provider]    Past Medical History:  Diagnosis Date  . Anxiety   . Depression   . Irregular menses     Past Surgical History:  Procedure Laterality Date  . KNEE SURGERY  01/2009   RIGHT KNEE    Social History   Tobacco Use  . Smoking status: Never Smoker  . Smokeless tobacco: Never Used  Substance Use Topics    . Alcohol use: Yes    Comment: occ    Family History  Problem Relation Age of Onset  . Hypertension Maternal Grandfather   . Hypertension Paternal Grandmother   . Diabetes Paternal Grandmother   . Alcoholism Father   . Bipolar disorder Sister   . Bipolar disorder Brother     Review of Systems  Constitutional: Negative for chills and fever.  Respiratory: Negative for cough and shortness of breath.   Cardiovascular: Positive for chest pain. Negative for palpitations and leg swelling.  Gastrointestinal: Negative for abdominal pain, nausea and vomiting.     OBJECTIVE:  Blood pressure 122/78, pulse 90, temperature 98.1 F (36.7 C), weight 236 lb (107 kg), SpO2 98 %.  6 min walk on RA was normal, O2 remained at 97% with no tachycardia  Physical Exam  Constitutional: She is oriented to person, place, and time and well-developed, well-nourished, and in no distress.  HENT:  Head: Normocephalic and atraumatic.  Mouth/Throat: Oropharynx is clear and moist. No oropharyngeal exudate.  Eyes: EOM are normal. Pupils are equal, round, and reactive to light. No scleral icterus.  Neck: Neck supple.  Cardiovascular: Normal rate, regular rhythm and normal heart sounds. Exam reveals no gallop and no friction rub.  No murmur heard. Pulmonary/Chest: Effort normal and breath sounds normal. She has no wheezes. She  has no rales. She exhibits no tenderness.  Abdominal: Soft. Bowel sounds are normal. She exhibits no distension. There is no tenderness.  Musculoskeletal: She exhibits no edema.  Neurological: She is alert and oriented to person, place, and time. Gait normal.  Skin: Skin is warm and dry.   Recent Results (from the past 2160 hour(s))  Comprehensive metabolic panel     Status: Abnormal   Collection Time: 01/01/17 10:28 AM  Result Value Ref Range   Glucose 105 (H) 65 - 99 mg/dL   BUN 8 6 - 20 mg/dL   Creatinine, Ser 4.69 0.57 - 1.00 mg/dL   GFR calc non Af Amer 94 >59 mL/min/1.73    GFR calc Af Amer 108 >59 mL/min/1.73   BUN/Creatinine Ratio 9 9 - 23   Sodium 142 134 - 144 mmol/L   Potassium 4.4 3.5 - 5.2 mmol/L   Chloride 104 96 - 106 mmol/L   CO2 19 (L) 20 - 29 mmol/L   Calcium 9.5 8.7 - 10.2 mg/dL   Total Protein 7.1 6.0 - 8.5 g/dL   Albumin 4.0 3.5 - 5.5 g/dL   Globulin, Total 3.1 1.5 - 4.5 g/dL   Albumin/Globulin Ratio 1.3 1.2 - 2.2   Bilirubin Total 0.2 0.0 - 1.2 mg/dL   Alkaline Phosphatase 62 39 - 117 IU/L   AST 15 0 - 40 IU/L   ALT 12 0 - 32 IU/L  Hemoglobin A1c     Status: None   Collection Time: 01/01/17 10:28 AM  Result Value Ref Range   Hgb A1c MFr Bld 5.1 4.8 - 5.6 %    Comment:          Prediabetes: 5.7 - 6.4          Diabetes: >6.4          Glycemic control for adults with diabetes: <7.0    Est. average glucose Bld gHb Est-mCnc 100 mg/dL  Lipid panel     Status: Abnormal   Collection Time: 01/01/17 10:28 AM  Result Value Ref Range   Cholesterol, Total 158 100 - 199 mg/dL   Triglycerides 629 (H) 0 - 149 mg/dL   HDL 71 >52 mg/dL   VLDL Cholesterol Cal 35 5 - 40 mg/dL   LDL Calculated 52 0 - 99 mg/dL   Chol/HDL Ratio 2.2 0.0 - 4.4 ratio    Comment:                                   T. Chol/HDL Ratio                                             Men  Women                               1/2 Avg.Risk  3.4    3.3                                   Avg.Risk  5.0    4.4  2X Avg.Risk  9.6    7.1                                3X Avg.Risk 23.4   11.0   Hepatitis C antibody     Status: None   Collection Time: 01/01/17 10:28 AM  Result Value Ref Range   Hep C Virus Ab <0.1 0.0 - 0.9 s/co ratio    Comment:                                   Negative:     < 0.8                              Indeterminate: 0.8 - 0.9                                   Positive:     > 0.9  The CDC recommends that a positive HCV antibody result  be followed up with a HCV Nucleic Acid Amplification  test (161096).   HIV antibody     Status:  None   Collection Time: 01/01/17 10:28 AM  Result Value Ref Range   HIV Screen 4th Generation wRfx Non Reactive Non Reactive  RPR     Status: None   Collection Time: 01/01/17 10:28 AM  Result Value Ref Range   RPR Ser Ql Non Reactive Non Reactive  Trichomonas vaginalis, RNA     Status: None   Collection Time: 01/01/17 10:32 AM  Result Value Ref Range   Trich vag by NAA Negative Negative  GC/Chlamydia Probe Amp     Status: None   Collection Time: 01/01/17 11:02 AM  Result Value Ref Range   Chlamydia trachomatis, NAA Negative Negative   Neisseria gonorrhoeae by PCR Negative Negative  TSH     Status: None   Collection Time: 02/07/17  8:09 AM  Result Value Ref Range   TSH 3.360 0.450 - 4.500 uIU/mL  Lithium level     Status: Abnormal   Collection Time: 02/07/17  8:09 AM  Result Value Ref Range   Lithium Lvl 0.5 (L) 0.6 - 1.2 mmol/L    Comment:                                  Detection Limit = 0.1                           <0.1 indicates None Detected     Dg Chest 2 View  Result Date: 02/15/2017 CLINICAL DATA:  26 y/o  female with pleuritic chest pain. EXAM: CHEST  2 VIEW COMPARISON:  None. FINDINGS: Normal cardiac size and mediastinal contours. Visualized tracheal air column is within normal limits. Lung volumes are within normal limits. The lungs appear clear. No pneumothorax or pleural effusion. Negative visible bowel gas pattern. No osseous abnormality identified. IMPRESSION: Negative.  No acute cardiopulmonary abnormality. Electronically Signed   By: Odessa Fleming M.D.   On: 02/15/2017 08:59     ASSESSMENT and PLAN  1. Chest pain on breathing Atypical with normal exam and evaluation today. Meds reviewed and does not  seem to be a known side effect.  CP sounds pleuritic in nature, but no identifiable cause of pleurisy identified. May also be GERD related given at least once triggered by food. Will do trial of PPI. Discussed with patient that overall no concerning cause identified.    - EKG 12-Lead NSR, HR 71, normal intervals, no ST changes - DG Chest 2 View; Future - negative  Other orders - omeprazole (PRILOSEC) 20 MG capsule; Take 1 capsule (20 mg total) by mouth 2 (two) times daily before a meal.  Return if symptoms worsen or fail to improve.    Myles LippsIrma M Santiago, MD [facility] [address] [place], [place] [address] Ph.  [phone] Fax [phone]

## 2017-02-15 NOTE — Patient Instructions (Signed)
     IF you received an x-ray today, you will receive an invoice from [facility]. Please contact [facility] at [phone] with questions or concerns regarding your invoice.   IF you received labwork today, you will receive an invoice from [facility]. Please contact [facility] at [phone] with questions or concerns regarding your invoice.   Our billing staff will not be able to assist you with questions regarding bills from these companies.  You will be contacted with the lab results as soon as they are available. The fastest way to get your results is to activate your My Chart account. Instructions are located on the last page of this paperwork. If you have not heard from us regarding the results in 2 weeks, please contact this office.     

## 2017-02-19 ENCOUNTER — Ambulatory Visit: Payer: 59 | Admitting: Psychology

## 2017-02-19 DIAGNOSIS — F331 Major depressive disorder, recurrent, moderate: Secondary | ICD-10-CM | POA: Diagnosis not present

## 2017-02-28 MED FILL — LITHIUM CARBONATE 300 MG CA: 300 | 30 days supply | Qty: 60 | Fill #1

## 2017-02-28 MED FILL — busPIRone HCL 30 MG TABS: 30 | 30 days supply | Qty: 60 | Fill #2

## 2017-02-28 MED FILL — LATUDA 60 MG TABLET: 60 | 30 days supply | Qty: 30 | Fill #0

## 2017-03-04 DIAGNOSIS — K219 Gastro-esophageal reflux disease without esophagitis: Secondary | ICD-10-CM | POA: Diagnosis not present

## 2017-03-05 ENCOUNTER — Ambulatory Visit: Payer: Self-pay | Admitting: Psychology

## 2017-03-26 ENCOUNTER — Encounter: Payer: Self-pay | Admitting: Women's Health

## 2017-03-26 ENCOUNTER — Ambulatory Visit (INDEPENDENT_AMBULATORY_CARE_PROVIDER_SITE_OTHER): Payer: 59 | Admitting: Women's Health

## 2017-03-26 ENCOUNTER — Ambulatory Visit: Payer: 59 | Admitting: Psychology

## 2017-03-26 VITALS — BP 126/80 | Ht 67.0 in | Wt 235.0 lb

## 2017-03-26 DIAGNOSIS — F331 Major depressive disorder, recurrent, moderate: Secondary | ICD-10-CM | POA: Diagnosis not present

## 2017-03-26 DIAGNOSIS — N898 Other specified noninflammatory disorders of vagina: Secondary | ICD-10-CM | POA: Diagnosis not present

## 2017-03-26 DIAGNOSIS — Z3044 Encounter for surveillance of vaginal ring hormonal contraceptive device: Secondary | ICD-10-CM | POA: Diagnosis not present

## 2017-03-26 DIAGNOSIS — Z13220 Encounter for screening for lipoid disorders: Secondary | ICD-10-CM

## 2017-03-26 DIAGNOSIS — Z01419 Encounter for gynecological examination (general) (routine) without abnormal findings: Secondary | ICD-10-CM

## 2017-03-26 LAB — WET PREP FOR TRICH, YEAST, CLUE

## 2017-03-26 MED ORDER — FLUCONAZOLE 150 MG PO TABS: 150.0000 mg | ORAL_TABLET | Freq: Once | ORAL | 1 refills | Status: AC

## 2017-03-26 MED ORDER — ETONOGESTREL-ETHINYL ESTRADIOL 0.12-0.015 MG/24HR VA RING: VAGINAL_RING | VAGINAL | 4 refills | Status: DC

## 2017-03-26 MED FILL — FLUCONAZOLE 150 MG TABLET: 150 | 1 days supply | Qty: 1 | Fill #0

## 2017-03-26 MED FILL — NUVARING VAGINAL RING: 0.12-0.015 | 84 days supply | Qty: 3 | Fill #0

## 2017-03-26 NOTE — Progress Notes (Signed)
Tara Sullivan 10/27/1990 [id number]    History:    Presents for annual exam. Monthly cycle on NuvaRing. Gardasil series completed. Normal Pap history. History of Chlamydia in the past. Negative STD screen 12/2016. History of depression continues to see a counselor twice monthly.  IBS.   Past medical history, past surgical history, family history and social history were all reviewed and documented in the EPIC chart. [facility]  ROS:  A ROS was performed and pertinent positives and negatives are included.  Exam:  Vitals:   03/26/17 1401  Weight: 235 lb (106.6 kg)  Height: 5\' 7"  (1.702 m)   Body mass index is 36.81 kg/m.   General appearance:  Normal Thyroid:  Symmetrical, normal in size, without palpable masses or nodularity. Respiratory  Auscultation:  Clear without wheezing or rhonchi Cardiovascular  Auscultation:  Regular rate, without rubs, murmurs or gallops  Edema/varicosities:  Not grossly evident Abdominal  Soft,nontender, without masses, guarding or rebound.  Liver/spleen:  No organomegaly noted  Hernia:  None appreciated  Skin  Inspection:  Grossly normal   Breasts: Examined lying and sitting.     Right: Without masses, retractions, discharge or axillary adenopathy.     Left: Without masses, retractions, discharge or axillary adenopathy. Gentitourinary   Inguinal/mons:  Normal without inguinal adenopathy  External genitalia:  Normal  BUS/Urethra/Skene's glands:  Normal  Vagina:  Vaginal walls erythematous, moderate white discharge, wet prep negative  Cervix:  Normal  Uterus:   normal in size, shape and contour.  Midline and mobile  Adnexa/parametria:     Rt: Without masses or tenderness.   Lt: Without masses or tenderness.  Anus and perineum: Normal  Digital rectal exam: Normal sphincter tone without palpated masses or tenderness  Assessment/Plan:  [age] y.o. S W[place] G0  for annual exam with complaint of vaginal irritation  Monthly cycle on NuvaRing/not  sexually active Morbid obesity Depression managed by  meds and counseling IBS  Plan: NuvaRing prescription, proper use, slight risk for blood clots and strokes reviewed. Diflucan 150 times one dose, reviewed wet prep negative, appear to be used like discharge. SBEs, exercise, calcium rich diet, MVI daily encouraged. Reviewed importance of increasing exercise and decreasing calories/carbs. Will return to the office fasting for CBC, glucose, lipid panel, Pap normal 2017, new screening guidelines reviewed.  Harrington Challengerancy J Markevius [facility]NP, 2:03 PM 03/26/2017

## 2017-03-26 NOTE — Patient Instructions (Signed)
Health Maintenance, Female Adopting a healthy lifestyle and getting preventive care can go a long way to promote health and wellness. Talk with your health care provider about what schedule of regular examinations is right for you. This is a good chance for you to check in with your provider about disease prevention and staying healthy. In between checkups, there are plenty of things you can do on your own. Experts have done a lot of research about which lifestyle changes and preventive measures are most likely to keep you healthy. Ask your health care provider for more information. Weight and diet Eat a healthy diet  Be sure to include plenty of vegetables, fruits, low-fat dairy products, and lean protein.  Do not eat a lot of foods high in solid fats, added sugars, or salt.  Get regular exercise. This is one of the most important things you can do for your health. ? Most adults should exercise for at least 150 minutes each week. The exercise should increase your heart rate and make you sweat (moderate-intensity exercise). ? Most adults should also do strengthening exercises at least twice a week. This is in addition to the moderate-intensity exercise.  Maintain a healthy weight  Body mass index (BMI) is a measurement that can be used to identify possible weight problems. It estimates body fat based on height and weight. Your health care provider can help determine your BMI and help you achieve or maintain a healthy weight.  For females 20 years of age and older: ? A BMI below 18.5 is considered underweight. ? A BMI of 18.5 to 24.9 is normal. ? A BMI of 25 to 29.9 is considered overweight. ? A BMI of 30 and above is considered obese.  Watch levels of cholesterol and blood lipids  You should start having your blood tested for lipids and cholesterol at 29 years of age, then have this test every 5 years.  You may need to have your cholesterol levels checked more often if: ? Your lipid or  cholesterol levels are high. ? You are older than 29 years of age. ? You are at high risk for heart disease.  Cancer screening Lung Cancer  Lung cancer screening is recommended for adults 55-80 years old who are at high risk for lung cancer because of a history of smoking.  A yearly low-dose CT scan of the lungs is recommended for people who: ? Currently smoke. ? Have quit within the past 15 years. ? Have at least a 30-pack-year history of smoking. A pack year is smoking an average of one pack of cigarettes a day for 1 year.  Yearly screening should continue until it has been 15 years since you quit.  Yearly screening should stop if you develop a health problem that would prevent you from having lung cancer treatment.  Breast Cancer  Practice breast self-awareness. This means understanding how your breasts normally appear and feel.  It also means doing regular breast self-exams. Let your health care provider know about any changes, no matter how small.  If you are in your 20s or 30s, you should have a clinical breast exam (CBE) by a health care provider every 1-3 years as part of a regular health exam.  If you are 40 or older, have a CBE every year. Also consider having a breast X-ray (mammogram) every year.  If you have a family history of breast cancer, talk to your health care provider about genetic screening.  If you are at high risk   for breast cancer, talk to your health care provider about having an MRI and a mammogram every year.  Breast cancer gene (BRCA) assessment is recommended for women who have family members with BRCA-related cancers. BRCA-related cancers include: ? Breast. ? Ovarian. ? Tubal. ? Peritoneal cancers.  Results of the assessment will determine the need for genetic counseling and BRCA1 and BRCA2 testing.  Cervical Cancer Your health care provider may recommend that you be screened regularly for cancer of the pelvic organs (ovaries, uterus, and  vagina). This screening involves a pelvic examination, including checking for microscopic changes to the surface of your cervix (Pap test). You may be encouraged to have this screening done every 3 years, beginning at age 22.  For women ages 56-65, health care providers may recommend pelvic exams and Pap testing every 3 years, or they may recommend the Pap and pelvic exam, combined with testing for human papilloma virus (HPV), every 5 years. Some types of HPV increase your risk of cervical cancer. Testing for HPV may also be done on women of any age with unclear Pap test results.  Other health care providers may not recommend any screening for nonpregnant women who are considered low risk for pelvic cancer and who do not have symptoms. Ask your health care provider if a screening pelvic exam is right for you.  If you have had past treatment for cervical cancer or a condition that could lead to cancer, you need Pap tests and screening for cancer for at least 20 years after your treatment. If Pap tests have been discontinued, your risk factors (such as having a new sexual partner) need to be reassessed to determine if screening should resume. Some women have medical problems that increase the chance of getting cervical cancer. In these cases, your health care provider may recommend more frequent screening and Pap tests.  Colorectal Cancer  This type of cancer can be detected and often prevented.  Routine colorectal cancer screening usually begins at 29 years of age and continues through 29 years of age.  Your health care provider may recommend screening at an earlier age if you have risk factors for colon cancer.  Your health care provider may also recommend using home test kits to check for hidden blood in the stool.  A small camera at the end of a tube can be used to examine your colon directly (sigmoidoscopy or colonoscopy). This is done to check for the earliest forms of colorectal  cancer.  Routine screening usually begins at age 33.  Direct examination of the colon should be repeated every 5-10 years through 29 years of age. However, you may need to be screened more often if early forms of precancerous polyps or small growths are found.  Skin Cancer  Check your skin from head to toe regularly.  Tell your health care provider about any new moles or changes in moles, especially if there is a change in a mole's shape or color.  Also tell your health care provider if you have a mole that is larger than the size of a pencil eraser.  Always use sunscreen. Apply sunscreen liberally and repeatedly throughout the day.  Protect yourself by wearing long sleeves, pants, a wide-brimmed hat, and sunglasses whenever you are outside.  Heart disease, diabetes, and high blood pressure  High blood pressure causes heart disease and increases the risk of stroke. High blood pressure is more likely to develop in: ? People who have blood pressure in the high end of  the normal range (130-139/85-89 mm Hg). ? People who are overweight or obese. ? People who are African American.  If you are 21-29 years of age, have your blood pressure checked every 3-5 years. If you are 3 years of age or older, have your blood pressure checked every year. You should have your blood pressure measured twice-once when you are at a hospital or clinic, and once when you are not at a hospital or clinic. Record the average of the two measurements. To check your blood pressure when you are not at a hospital or clinic, you can use: ? An automated blood pressure machine at a pharmacy. ? A home blood pressure monitor.  If you are between 17 years and 37 years old, ask your health care provider if you should take aspirin to prevent strokes.  Have regular diabetes screenings. This involves taking a blood sample to check your fasting blood sugar level. ? If you are at a normal weight and have a low risk for diabetes,  have this test once every three years after 29 years of age. ? If you are overweight and have a high risk for diabetes, consider being tested at a younger age or more often. Preventing infection Hepatitis B  If you have a higher risk for hepatitis B, you should be screened for this virus. You are considered at high risk for hepatitis B if: ? You were born in a country where hepatitis B is common. Ask your health care provider which countries are considered high risk. ? Your parents were born in a high-risk country, and you have not been immunized against hepatitis B (hepatitis B vaccine). ? You have HIV or AIDS. ? You use needles to inject street drugs. ? You live with someone who has hepatitis B. ? You have had sex with someone who has hepatitis B. ? You get hemodialysis treatment. ? You take certain medicines for conditions, including cancer, organ transplantation, and autoimmune conditions.  Hepatitis C  Blood testing is recommended for: ? Everyone born from [birth year] through 1967. ? Anyone with known risk factors for hepatitis C.  Sexually transmitted infections (STIs)  You should be screened for sexually transmitted infections (STIs) including gonorrhea and chlamydia if: ? You are sexually active and are younger than 29 years of age. ? You are older than 29 years of age and your health care provider tells you that you are at risk for this type of infection. ? Your sexual activity has changed since you were last screened and you are at an increased risk for chlamydia or gonorrhea. Ask your health care provider if you are at risk.  If you do not have HIV, but are at risk, it may be recommended that you take a prescription medicine daily to prevent HIV infection. This is called pre-exposure prophylaxis (PrEP). You are considered at risk if: ? You are sexually active and do not regularly use condoms or know the HIV status of your partner(s). ? You take drugs by injection. ? You are  sexually active with a partner who has HIV.  Talk with your health care provider about whether you are at high risk of being infected with HIV. If you choose to begin PrEP, you should first be tested for HIV. You should then be tested every 3 months for as long as you are taking PrEP. Pregnancy  If you are premenopausal and you may become pregnant, ask your health care provider about preconception counseling.  If you may become  pregnant, take 400 to 800 micrograms (mcg) of folic acid every day.  If you want to prevent pregnancy, talk to your health care provider about birth control (contraception). Osteoporosis and menopause  Osteoporosis is a disease in which the bones lose minerals and strength with aging. This can result in serious bone fractures. Your risk for osteoporosis can be identified using a bone density scan.  If you are 65 years of age or older, or if you are at risk for osteoporosis and fractures, ask your health care provider if you should be screened.  Ask your health care provider whether you should take a calcium or vitamin D supplement to lower your risk for osteoporosis.  Menopause may have certain physical symptoms and risks.  Hormone replacement therapy may reduce some of these symptoms and risks. Talk to your health care provider about whether hormone replacement therapy is right for you. Follow these instructions at home:  Schedule regular health, dental, and eye exams.  Stay current with your immunizations.  Do not use any tobacco products including cigarettes, chewing tobacco, or electronic cigarettes.  If you are pregnant, do not drink alcohol.  If you are breastfeeding, limit how much and how often you drink alcohol.  Limit alcohol intake to no more than 1 drink per day for nonpregnant women. One drink equals 12 ounces of beer, 5 ounces of wine, or 1 ounces of hard liquor.  Do not use street drugs.  Do not share needles.  Ask your health care  provider for help if you need support or information about quitting drugs.  Tell your health care provider if you often feel depressed.  Tell your health care provider if you have ever been abused or do not feel safe at home. This information is not intended to replace advice given to you by your health care provider. Make sure you discuss any questions you have with your health care provider. Document Released: 09/03/2010 Document Revised: 07/27/2015 Document Reviewed: 11/22/2014 Elsevier Interactive Patient Education  2018 [employer] Carbohydrate Counting for Diabetes Mellitus, Adult Carbohydrate counting is a method for keeping track of how many carbohydrates you eat. Eating carbohydrates naturally increases the amount of sugar (glucose) in the blood. Counting how many carbohydrates you eat helps keep your blood glucose within normal limits, which helps you manage your diabetes (diabetes mellitus). It is important to know how many carbohydrates you can safely have in each meal. This is different for every person. A diet and nutrition specialist (registered dietitian) can help you make a meal plan and calculate how many carbohydrates you should have at each meal and snack. Carbohydrates are found in the following foods:  Grains, such as breads and cereals.  Dried beans and soy products.  Starchy vegetables, such as potatoes, peas, and corn.  Fruit and fruit juices.  Milk and yogurt.  Sweets and snack foods, such as cake, cookies, candy, chips, and soft drinks.  How do I count carbohydrates? There are two ways to count carbohydrates in food. You can use either of the methods or a combination of both. Reading "Nutrition Facts" on packaged food The "Nutrition Facts" list is included on the labels of almost all packaged foods and beverages in the [place] It includes:  The serving size.  Information about nutrients in each serving, including the grams (g) of carbohydrate per  serving.  To use the "Nutrition Facts":  Decide how many servings you will have.  Multiply the number of servings by the number of carbohydrates   per serving.  The resulting number is the total amount of carbohydrates that you will be having.  Learning standard serving sizes of other foods When you eat foods containing carbohydrates that are not packaged or do not include "Nutrition Facts" on the label, you need to measure the servings in order to count the amount of carbohydrates:  Measure the foods that you will eat with a food scale or measuring cup, if needed.  Decide how many standard-size servings you will eat.  Multiply the number of servings by 15. Most carbohydrate-rich foods have about 15 g of carbohydrates per serving. ? For example, if you eat 8 oz (170 g) of strawberries, you will have eaten 2 servings and 30 g of carbohydrates (2 servings x 15 g = 30 g).  For foods that have more than one food mixed, such as soups and casseroles, you must count the carbohydrates in each food that is included.  The following list contains standard serving sizes of common carbohydrate-rich foods. Each of these servings has about 15 g of carbohydrates:   hamburger bun or  English muffin.   oz (15 mL) syrup.   oz (14 g) jelly.  1 slice of bread.  1 six-inch tortilla.  3 oz (85 g) cooked rice or pasta.  4 oz (113 g) cooked dried beans.  4 oz (113 g) starchy vegetable, such as peas, corn, or potatoes.  4 oz (113 g) hot cereal.  4 oz (113 g) mashed potatoes or  of a large baked potato.  4 oz (113 g) canned or frozen fruit.  4 oz (120 mL) fruit juice.  4-6 crackers.  6 chicken nuggets.  6 oz (170 g) unsweetened dry cereal.  6 oz (170 g) plain fat-free yogurt or yogurt sweetened with artificial sweeteners.  8 oz (240 mL) milk.  8 oz (170 g) fresh fruit or one small piece of fruit.  24 oz (680 g) popped popcorn.  Example of carbohydrate counting Sample meal  3  oz (85 g) chicken breast.  6 oz (170 g) brown rice.  4 oz (113 g) corn.  8 oz (240 mL) milk.  8 oz (170 g) strawberries with sugar-free whipped topping. Carbohydrate calculation 1. Identify the foods that contain carbohydrates: ? Rice. ? Corn. ? Milk. ? Strawberries. 2. Calculate how many servings you have of each food: ? 2 servings rice. ? 1 serving corn. ? 1 serving milk. ? 1 serving strawberries. 3. Multiply each number of servings by 15 g: ? 2 servings rice x 15 g = 30 g. ? 1 serving corn x 15 g = 15 g. ? 1 serving milk x 15 g = 15 g. ? 1 serving strawberries x 15 g = 15 g. 4. Add together all of the amounts to find the total grams of carbohydrates eaten: ? 30 g + 15 g + 15 g + 15 g = 75 g of carbohydrates total. This information is not intended to replace advice given to you by your health care provider. Make sure you discuss any questions you have with your health care provider. Document Released: 02/18/2005 Document Revised: 09/08/2015 Document Reviewed: 08/02/2015 Elsevier Interactive Patient Education  [employer].

## 2017-03-27 LAB — URINALYSIS W MICROSCOPIC + REFLEX CULTURE
BILIRUBIN URINE: NEGATIVE
Glucose, UA: NEGATIVE
HGB URINE DIPSTICK: NEGATIVE
KETONES UR: NEGATIVE
NITRITES URINE, INITIAL: NEGATIVE
Protein, ur: NEGATIVE
SPECIFIC GRAVITY, URINE: 1.024 (ref 1.001–1.03)
pH: 6 (ref 5.0–8.0)

## 2017-04-01 MED FILL — LATUDA 60 MG TABLET: 60 | 30 days supply | Qty: 30 | Fill #1

## 2017-04-01 MED FILL — busPIRone HCL 30 MG TABS: 30 | 30 days supply | Qty: 60 | Fill #0

## 2017-04-03 ENCOUNTER — Other Ambulatory Visit: Payer: Self-pay | Admitting: Physician Assistant

## 2017-04-03 ENCOUNTER — Other Ambulatory Visit: Payer: 59

## 2017-04-03 ENCOUNTER — Ambulatory Visit: Payer: 59

## 2017-04-03 DIAGNOSIS — Z13220 Encounter for screening for lipoid disorders: Secondary | ICD-10-CM | POA: Diagnosis not present

## 2017-04-03 DIAGNOSIS — Z01419 Encounter for gynecological examination (general) (routine) without abnormal findings: Secondary | ICD-10-CM

## 2017-04-03 DIAGNOSIS — Z79899 Other long term (current) drug therapy: Secondary | ICD-10-CM

## 2017-04-03 DIAGNOSIS — F3162 Bipolar disorder, current episode mixed, moderate: Secondary | ICD-10-CM | POA: Diagnosis not present

## 2017-04-03 LAB — CBC WITH DIFFERENTIAL/PLATELET
BASOS ABS: 7 {cells}/uL (ref 0–200)
BASOS PCT: 0.1 %
Eosinophils Absolute: 28 cells/uL (ref 15–500)
Eosinophils Relative: 0.4 %
HCT: 36.9 % (ref 35.0–45.0)
HEMOGLOBIN: 12.7 g/dL (ref 11.7–15.5)
Lymphs Abs: 2016 cells/uL (ref 850–3900)
MCH: 29.7 pg (ref 27.0–33.0)
MCHC: 34.4 g/dL (ref 32.0–36.0)
MCV: 86.4 fL (ref 80.0–100.0)
MPV: 10.3 fL (ref 7.5–12.5)
Monocytes Relative: 6.3 %
Neutro Abs: 4508 cells/uL (ref 1500–7800)
Neutrophils Relative %: 64.4 %
PLATELETS: 297 10*3/uL (ref 140–400)
RBC: 4.27 10*6/uL (ref 3.80–5.10)
RDW: 12.3 % (ref 11.0–15.0)
TOTAL LYMPHOCYTE: 28.8 %
WBC: 7 10*3/uL (ref 3.8–10.8)
WBCMIX: 441 {cells}/uL (ref 200–950)

## 2017-04-03 LAB — LIPID PANEL
CHOL/HDL RATIO: 1.9 (calc) (ref <5.0)
CHOLESTEROL: 132 mg/dL (ref <200)
HDL: 71 mg/dL (ref >50)
LDL Cholesterol (Calc): 47 mg/dL (calc)
Non-HDL Cholesterol (Calc): 61 mg/dL (calc) (ref <130)
TRIGLYCERIDES: 63 mg/dL (ref <150)

## 2017-04-03 LAB — GLUCOSE, RANDOM: Glucose, Bld: 91 mg/dL (ref 65–99)

## 2017-04-03 MED FILL — LATUDA 80 MG TABLET: 80 | 30 days supply | Qty: 30 | Fill #0

## 2017-04-03 MED FILL — LITHIUM CARBONATE 300 MG CA: 300 | 30 days supply | Qty: 90 | Fill #0

## 2017-04-03 NOTE — Progress Notes (Signed)
Orders Placed This Encounter  Procedures  . Lithium level    Standing Status:   Future    Standing Expiration Date:   05/01/2017   Fax exam results to Melony Overlyeresa Hurst, [facility]C at [facility].

## 2017-04-04 LAB — LITHIUM LEVEL: LITHIUM LVL: 0.3 mmol/L — AB (ref 0.6–1.2)

## 2017-04-16 ENCOUNTER — Ambulatory Visit: Payer: 59 | Admitting: Psychology

## 2017-04-18 ENCOUNTER — Other Ambulatory Visit: Payer: Self-pay | Admitting: Physician Assistant

## 2017-04-18 DIAGNOSIS — Z79899 Other long term (current) drug therapy: Secondary | ICD-10-CM

## 2017-04-18 DIAGNOSIS — F39 Unspecified mood [affective] disorder: Secondary | ICD-10-CM

## 2017-04-18 NOTE — Progress Notes (Signed)
Pt is on lithium - her Pyschatrist Melony Overlyeresa Hurst would like a lithium level drawn and faxed to [phone].

## 2017-04-24 ENCOUNTER — Ambulatory Visit: Payer: 59

## 2017-04-24 DIAGNOSIS — F39 Unspecified mood [affective] disorder: Secondary | ICD-10-CM | POA: Diagnosis not present

## 2017-04-24 DIAGNOSIS — Z79899 Other long term (current) drug therapy: Secondary | ICD-10-CM | POA: Diagnosis not present

## 2017-04-25 LAB — LITHIUM LEVEL: Lithium Lvl: 0.7 mmol/L (ref 0.6–1.2)

## 2017-04-30 MED FILL — LATUDA 80 MG TABLET: 80 | 30 days supply | Qty: 30 | Fill #1

## 2017-04-30 MED FILL — LITHIUM CARBONATE 300 MG CA: 300 | 30 days supply | Qty: 90 | Fill #1

## 2017-04-30 MED FILL — busPIRone HCL 30 MG TABS: 30 | 30 days supply | Qty: 60 | Fill #1

## 2017-05-06 ENCOUNTER — Ambulatory Visit (INDEPENDENT_AMBULATORY_CARE_PROVIDER_SITE_OTHER): Payer: 59 | Admitting: Physician Assistant

## 2017-05-06 ENCOUNTER — Other Ambulatory Visit: Payer: Self-pay | Admitting: Physician Assistant

## 2017-05-06 DIAGNOSIS — Z79899 Other long term (current) drug therapy: Secondary | ICD-10-CM | POA: Diagnosis not present

## 2017-05-06 NOTE — Progress Notes (Signed)
Pt needs lithium level drawn - results faxed to Melony Overlyeresa Hurst - [phone]

## 2017-05-06 NOTE — Progress Notes (Signed)
Lab visit only.  Provider did not see pt at this visit.  

## 2017-05-07 LAB — LITHIUM LEVEL: LITHIUM LVL: 0.7 mmol/L (ref 0.6–1.2)

## 2017-05-15 DIAGNOSIS — F3162 Bipolar disorder, current episode mixed, moderate: Secondary | ICD-10-CM | POA: Diagnosis not present

## 2017-05-15 MED FILL — LATUDA 120 MG TABLET: 120 | 30 days supply | Qty: 30 | Fill #0

## 2017-05-23 MED FILL — LITHIUM CARBONATE 300 MG CA: 300 | 30 days supply | Qty: 120 | Fill #0

## 2017-06-09 MED FILL — busPIRone HCL 30 MG TABS: 30 | 30 days supply | Qty: 60 | Fill #2

## 2017-06-11 ENCOUNTER — Encounter: Payer: Self-pay | Admitting: Physician Assistant

## 2017-06-16 MED FILL — LATUDA 120 MG TABLET: 120 | 30 days supply | Qty: 30 | Fill #1

## 2017-06-28 ENCOUNTER — Ambulatory Visit (INDEPENDENT_AMBULATORY_CARE_PROVIDER_SITE_OTHER): Payer: 59 | Admitting: Physician Assistant

## 2017-06-28 ENCOUNTER — Other Ambulatory Visit: Payer: Self-pay

## 2017-06-28 ENCOUNTER — Encounter: Payer: Self-pay | Admitting: Physician Assistant

## 2017-06-28 VITALS — BP 110/70 | HR 83 | Temp 98.3°F | Resp 18 | Ht 67.0 in | Wt 237.2 lb

## 2017-06-28 DIAGNOSIS — R0981 Nasal congestion: Secondary | ICD-10-CM

## 2017-06-28 DIAGNOSIS — F411 Generalized anxiety disorder: Secondary | ICD-10-CM | POA: Diagnosis not present

## 2017-06-28 DIAGNOSIS — F321 Major depressive disorder, single episode, moderate: Secondary | ICD-10-CM | POA: Diagnosis not present

## 2017-06-28 DIAGNOSIS — Z0000 Encounter for general adult medical examination without abnormal findings: Secondary | ICD-10-CM | POA: Diagnosis not present

## 2017-06-28 DIAGNOSIS — E669 Obesity, unspecified: Secondary | ICD-10-CM | POA: Diagnosis not present

## 2017-06-28 MED ORDER — MOMETASONE FUROATE 50 MCG/ACT NA SUSP: 2.0000 | Freq: Every day | NASAL | 3 refills | Status: DC

## 2017-06-28 NOTE — Patient Instructions (Signed)
     IF you received an x-ray today, you will receive an invoice from [facility]. Please contact [facility] at [phone] with questions or concerns regarding your invoice.   IF you received labwork today, you will receive an invoice from [facility]. Please contact [facility] at [phone] with questions or concerns regarding your invoice.   Our billing staff will not be able to assist you with questions regarding bills from these companies.  You will be contacted with the lab results as soon as they are available. The fastest way to get your results is to activate your My Chart account. Instructions are located on the last page of this paperwork. If you have not heard from us regarding the results in 2 weeks, please contact this office.     

## 2017-06-28 NOTE — Progress Notes (Signed)
Tara Sullivan  MRN: [id number] DOB: December 13, 1990  PCP: Morrell Riddle, PA-C   Chief Complaint  Patient presents with  . Annual Exam    No pap needed, Dpression scale score 16, GAD 7 score 17    Subjective:  Pt presents to clinic for a CPE.  Last dental exam: recently went back to going to the dentist Last vision exam: wears glasses - 2 years ago Last pap: 2017  Vaccinations - UTD  Pt feels like she breaths loud - other people have made comments to her and now she is becoming self conscious.  She does not notice it at particular times - she breaths through her mouth and nose - she is not SOB.     Typical meals for patient: 3 meals a day, trying to eat healthy - dad buys snacks for house and it is hard not to eat them Typical beverage choices: water, soda (2-3 12oz a day) Exercises: no - she has a gym membership but has not started going yet Sleeps: 6 hrs per night and she wakes up a lot but she is able to fall back asleep   Patient Active Problem List   Diagnosis Date Noted  . Obesity, Class II, BMI 35-39.9 - tried to loss weight - stress eater with her uncontrolled depression 01/01/2017  . History of drug overdose 01/09/2014  . Generalized anxiety disorder - sees specialist 01/09/2014  . Depression - see specialist 01/09/2014  . History of chlamydia 11/13/2011  . Irregular menses     Review of Systems  Constitutional: Negative.   HENT: Negative.   Eyes: Negative.   Respiratory: Negative.   Cardiovascular: Negative.   Gastrointestinal: Negative.   Endocrine: Negative.   Genitourinary: Negative.   Musculoskeletal: Negative.   Skin: Negative.   Allergic/Immunologic: Negative.   Neurological: Negative.   Hematological: Negative.   Psychiatric/Behavioral: Negative.      Current Outpatient Medications on File Prior to Visit  Medication Sig Dispense Refill  . busPIRone (BUSPAR) 30 MG tablet Take 30 mg by mouth 2 (two) times daily.    . clonazePAM (KLONOPIN) 1  MG tablet Take 1 mg by mouth 2 (two) times daily.    Marland Kitchen etonogestrel-ethinyl estradiol (NUVARING) 0.12-0.015 MG/24HR vaginal ring Insert vaginally and leave in place for 3 consecutive weeks, then remove for 1 week. 3 each 4  . lithium carbonate 300 MG capsule Take 300 mg by mouth 2 (two) times daily with a meal.    . omeprazole (PRILOSEC) 20 MG capsule Take 1 capsule (20 mg total) by mouth 2 (two) times daily before a meal. 30 capsule 1  . LATUDA 120 MG TABS   1   No current facility-administered medications on file prior to visit.     Allergies  Allergen Reactions  . Latex     Swelling and burning of skin    Social History   Socioeconomic History  . Marital status: Single    Spouse name: Not on file  . Number of children: 0  . Years of education: Not on file  . Highest education level: Not on file  Occupational History  . Occupation: CMA    Employer:     Comment: Primary Care at [employer]  . Financial resource strain: Not on file  . Food insecurity:    Worry: Not on file    Inability: Not on file  . Transportation needs:    Medical: Not on file    Non-medical: Not  on file  Tobacco Use  . Smoking status: Never Smoker  . Smokeless tobacco: Never Used  Substance and Sexual Activity  . Alcohol use: Yes    Comment: social - no binge drinking  . Drug use: No  . Sexual activity: Yes    Partners: Male    Birth control/protection: Inserts  Lifestyle  . Physical activity:    Days per week: Not on file    Minutes per session: Not on file  . Stress: Not on file  Relationships  . Social connections:    Talks on phone: Not on file    Gets together: Not on file    Attends religious service: Not on file    Active member of club or organization: Not on file    Attends meetings of clubs or organizations: Not on file    Relationship status: Not on file  Other Topics Concern  . Not on file  Social History Narrative   Lives with parents    Past  Surgical History:  Procedure Laterality Date  . KNEE SURGERY  01/2009   RIGHT KNEE    Family History  Problem Relation Age of Onset  . Hypertension Maternal Grandfather   . Hypertension Paternal Grandmother   . Diabetes Paternal Grandmother   . Alcoholism Father   . Bipolar disorder Sister   . Bipolar disorder Brother      Objective:  BP 110/70 (BP Location: Left Arm, Patient Position: Sitting, Cuff Size: Large)   Pulse 83   Temp 98.3 F (36.8 C) (Oral)   Resp 18   Ht  (1.702 m)   Wt 237 lb 3.2 oz (107.6 kg)   SpO2 98%   BMI 37.15 kg/m   Physical Exam  Constitutional: She is oriented to person, place, and time.  HENT:  Head: Normocephalic and atraumatic.  Right Ear: Hearing, tympanic membrane, external ear and ear canal normal.  Left Ear: Hearing, tympanic membrane, external ear and ear canal normal.  Nose: Mucosal edema (pale and swollen - worse on the right side) present.  Mouth/Throat: Uvula is midline, oropharynx is clear and moist and mucous membranes are normal.  Eyes: Pupils are equal, round, and reactive to light. Conjunctivae and EOM are normal.  Neck: Trachea normal and normal range of motion. Neck supple. No thyroid mass and no thyromegaly present.  Cardiovascular: Normal rate, regular rhythm and normal heart sounds.  No murmur heard. Pulmonary/Chest: Effort normal and breath sounds normal. She has no wheezes.  Abdominal: Soft. Bowel sounds are normal. There is no tenderness.  Musculoskeletal: Normal range of motion.  Lymphadenopathy:    She has no cervical adenopathy.  Neurological: She is alert and oriented to person, place, and time. She has normal strength and normal reflexes.  Skin: Skin is warm and dry.  Psychiatric: Judgment normal.    Wt Readings from Last 3 Encounters:  06/28/17 237 lb 3.2 oz (107.6 kg)  03/26/17 235 lb (106.6 kg)  02/15/17 236 lb (107 kg)     Visual Acuity Screening   Right eye Left eye Both eyes  Without  correction:     With correction:    Assessment and Plan :  Annual physical exam  Nasal congestion - Plan: mometasone (NASONEX) 50 MCG/ACT nasal spray - pt is concerned about loud breathing - she does have some nasal congestion on exam though it has not gotten worse during this allergy season - we will try Nasonex for the next 2 months which  will give the medication time to work and time   Obesity, Class II, BMI 35-39.9 -  d/w pt weight loss - she is a stress eater with her depression which she see her psychiatrist for  Generalized anxiety disorder  Current moderate episode of major depressive disorder without prior episode (HCC) -   for the patient to recognize if there has been a change in her symptoms.  Benny Lennert PA-C  Primary Care at [facility] 06/28/2017 9:29 AM

## 2017-06-30 MED FILL — LITHIUM CARBONATE 300 MG CA: 300 | 30 days supply | Qty: 120 | Fill #1

## 2017-06-30 MED FILL — MOMETASONE FUROATE 50 MCG S: 50 | 90 days supply | Qty: 51 | Fill #0

## 2017-07-08 MED FILL — busPIRone HCL 30 MG TABS: 30 | 30 days supply | Qty: 60 | Fill #3

## 2017-07-10 MED FILL — LATUDA 120 MG TABLET: 120 | 30 days supply | Qty: 30 | Fill #0

## 2017-07-24 ENCOUNTER — Encounter: Payer: Self-pay | Admitting: Physician Assistant

## 2017-07-24 ENCOUNTER — Other Ambulatory Visit: Payer: Self-pay

## 2017-07-24 ENCOUNTER — Ambulatory Visit: Payer: 59 | Admitting: Physician Assistant

## 2017-07-24 VITALS — BP 104/74 | HR 98 | Temp 98.9°F | Resp 18 | Ht 68.11 in | Wt 237.0 lb

## 2017-07-24 DIAGNOSIS — K121 Other forms of stomatitis: Secondary | ICD-10-CM

## 2017-07-24 DIAGNOSIS — Z79899 Other long term (current) drug therapy: Secondary | ICD-10-CM | POA: Diagnosis not present

## 2017-07-24 DIAGNOSIS — R238 Other skin changes: Secondary | ICD-10-CM | POA: Diagnosis not present

## 2017-07-24 MED ORDER — DEXAMETHASONE 0.5 MG/5ML PO ELIX: 1.0000 mg | ORAL_SOLUTION | Freq: Three times a day (TID) | ORAL | 0 refills | Status: DC

## 2017-07-24 MED ORDER — TRIAMCINOLONE ACETONIDE 0.1 % EX CREA: 1.0000 "application " | TOPICAL_CREAM | Freq: Two times a day (BID) | CUTANEOUS | 0 refills | Status: DC

## 2017-07-24 MED ORDER — CHLORHEXIDINE GLUCONATE 0.12 % MT SOLN: 15.0000 mL | Freq: Two times a day (BID) | OROMUCOSAL | 0 refills | Status: DC

## 2017-07-24 MED FILL — CHLORHEXIDINE 0.12% RINSE: 0.12 | 16 days supply | Qty: 473 | Fill #0

## 2017-07-24 MED FILL — TRIAMCINOLONE 0.1% CREAM: 0.1 | 20 days supply | Qty: 60 | Fill #0

## 2017-07-24 MED FILL — DEXAMETHASONE 0.5 MG/5 ML L: 0.5 | 3 days supply | Qty: 100 | Fill #0

## 2017-07-24 NOTE — Patient Instructions (Signed)
     IF you received an x-ray today, you will receive an invoice from [facility]. Please contact [facility] at [phone] with questions or concerns regarding your invoice.   IF you received labwork today, you will receive an invoice from [facility]. Please contact [facility] at [phone] with questions or concerns regarding your invoice.   Our billing staff will not be able to assist you with questions regarding bills from these companies.  You will be contacted with the lab results as soon as they are available. The fastest way to get your results is to activate your My Chart account. Instructions are located on the last page of this paperwork. If you have not heard from us regarding the results in 2 weeks, please contact this office.     

## 2017-07-24 NOTE — Progress Notes (Signed)
Tara Sullivan  MRN: [id number] DOB: [birth year]  PCP: Morrell Riddle, PA-C  Chief Complaint  Patient presents with  . Mouth Lesions    X 1 mth  . Depression    screening done    Subjective:  Pt presents to clinic for mouth lesions.  Started a month ago and they are getting better but they hurt esp when she eats or drinks.  She has had no change in her medications, other than an increase in her Jordan which she has been on for a long time.  She has done nothing for these lesions. She has known dry skin and does not drink enough water.  She has never had a cold sore.  She has had no new sexual exposures.  Very itchy on skin around her anus - she does admit to over wiping with dry tissue. Then she scratches and then she bleeds, then the area itches again.  She has not had recent diarrhea.  She has not had her lithium level checked recently.  History is obtained by patient.  Review of Systems  HENT: Positive for mouth sores.   Skin: Positive for rash (itching it not worse at night).    Patient Active Problem List   Diagnosis Date Noted  . Obesity, Class II, BMI 35-39.9 01/01/2017  . History of drug overdose 01/09/2014  . Generalized anxiety disorder 01/09/2014  . Depression 01/09/2014  . History of chlamydia 11/13/2011  . Irregular menses     Current Outpatient Medications on File Prior to Visit  Medication Sig Dispense Refill  . busPIRone (BUSPAR) 30 MG tablet Take 30 mg by mouth 2 (two) times daily.    . clonazePAM (KLONOPIN) 1 MG tablet Take 1 mg by mouth 2 (two) times daily.    Marland Kitchen etonogestrel-ethinyl estradiol (NUVARING) 0.12-0.015 MG/24HR vaginal ring Insert vaginally and leave in place for 3 consecutive weeks, then remove for 1 week. 3 each 4  . LATUDA 120 MG TABS   1  . lithium carbonate 300 MG capsule Take 300 mg by mouth 2 (two) times daily with a meal.    . mometasone (NASONEX) 50 MCG/ACT nasal spray Place 2 sprays into the nose daily. 51 g 3  . omeprazole  (PRILOSEC) 20 MG capsule Take 1 capsule (20 mg total) by mouth 2 (two) times daily before a meal. 30 capsule 1   No current facility-administered medications on file prior to visit.     Allergies  Allergen Reactions  . Latex     Swelling and burning of skin    Past Medical History:  Diagnosis Date  . Depression   . Irregular menses    Social History   Social History Narrative   Lives with parents   Social History   Tobacco Use  . Smoking status: Never Smoker  . Smokeless tobacco: Never Used  Substance Use Topics  . Alcohol use: Yes    Comment: social - no binge drinking  . Drug use: No   family history includes Alcoholism in her father; Bipolar disorder in her brother and sister; Diabetes in her paternal grandmother; Hypertension in her maternal grandfather and paternal grandmother.     Objective:  BP 104/74 (BP Location: Left Arm, Patient Position: Sitting, Cuff Size: Large)   Pulse 98   Temp 98.9 F (37.2 C) (Oral)   Resp 18   Ht 5' 8.11" (1.73 m)   Wt 237 lb (107.5 kg)   SpO2 97%   BMI 35.92 kg/m  Body  mass index is 35.92 kg/m.  Physical Exam  Constitutional: She is oriented to person, place, and time.  HENT:  Head: Normocephalic and atraumatic.  Right Ear: Hearing and external ear normal.  Left Ear: Hearing and external ear normal.  Mouth/Throat: Oral lesions (upper right gingiva are erythematous and swollen, no discrete ulcerations seen in mouth, upper and lower lip very dry and parts are swollen where they are cracked) present. Normal dentition. No uvula swelling or dental caries. No posterior oropharyngeal edema (no soft palate lesions) or posterior oropharyngeal erythema.  Eyes: Conjunctivae are normal.  Neck: Normal range of motion.  Cardiovascular: Normal rate, regular rhythm and normal heart sounds.  No murmur heard. Pulmonary/Chest: Effort normal and breath sounds normal. She has no wheezes.  Neurological: She is alert and oriented to person,  place, and time.  Skin: Skin is warm and dry.  Skin surrounding her anus about 2 inches around is erythematous and excoriated and some areas with open skin - no vesicles - anus has no lesions or erythema or excoriations or fissues  Psychiatric: Judgment normal.  Vitals reviewed.   Assessment and Plan :  Mouth ulcers - Plan: CBC with Differential/Platelet, Herpes simplex virus culture  Lithium use - Plan: Lithium level - check level for upcoming appt  Stomatitis - Plan: chlorhexidine (PERIDEX) 0.12 % solution, dexamethasone 0.5 MG/5ML elixir - use to clean mouth and get these areas to start to heal - overall gums seems really irritated and raw  Skin irritation - Plan: triamcinolone cream (KENALOG) 0.1 % - pt to switch to wet wipes and try to wipe less and cause less local irritation - she will be mindful over overwiping to prevent dryness.    She is overall dehydrated - partially due to medication side effects and also because she inadequately hydrates.  She will work on this.  Patient verbalized to me that she understood the following: their diagnosis, what is being done for it, what to expect and what should be done at home.  See after visit summary for patient specific instructions.   Benny Lennert PA-C  Primary Care at [facility] 07/24/2017 1:15 PM

## 2017-07-25 LAB — CBC WITH DIFFERENTIAL/PLATELET
BASOS ABS: 0 10*3/uL (ref 0.0–0.2)
Basos: 0 %
EOS (ABSOLUTE): 0.2 10*3/uL (ref 0.0–0.4)
Eos: 2 %
Hematocrit: 38.8 % (ref 34.0–46.6)
Hemoglobin: 13.1 g/dL (ref 11.1–15.9)
IMMATURE GRANS (ABS): 0 10*3/uL (ref 0.0–0.1)
Immature Granulocytes: 0 %
LYMPHS ABS: 2.1 10*3/uL (ref 0.7–3.1)
LYMPHS: 21 %
MCH: 29.4 pg (ref 26.6–33.0)
MCHC: 33.8 g/dL (ref 31.5–35.7)
MCV: 87 fL (ref 79–97)
MONOS ABS: 0.7 10*3/uL (ref 0.1–0.9)
Monocytes: 7 %
NEUTROS ABS: 6.9 10*3/uL (ref 1.4–7.0)
Neutrophils: 70 %
PLATELETS: 327 10*3/uL (ref 150–450)
RBC: 4.45 x10E6/uL (ref 3.77–5.28)
RDW: 13.1 % (ref 12.3–15.4)
WBC: 9.8 10*3/uL (ref 3.4–10.8)

## 2017-07-25 LAB — LITHIUM LEVEL: LITHIUM LVL: 1.1 mmol/L (ref 0.6–1.2)

## 2017-07-27 LAB — HERPES SIMPLEX VIRUS CULTURE

## 2017-08-01 MED FILL — LITHIUM CARBONATE 300 MG CA: 300 | 30 days supply | Qty: 120 | Fill #0

## 2017-08-07 MED FILL — LATUDA 80 MG TABLET: 80 | 30 days supply | Qty: 30 | Fill #0

## 2017-08-13 MED FILL — busPIRone HCL 30 MG TABS: 30 | 30 days supply | Qty: 60 | Fill #4

## 2017-08-22 DIAGNOSIS — F3162 Bipolar disorder, current episode mixed, moderate: Secondary | ICD-10-CM | POA: Diagnosis not present

## 2017-08-22 MED FILL — clonazePAM 0.5 MG TABS: 0.5 | 30 days supply | Qty: 30 | Fill #0

## 2017-08-22 MED FILL — SERTRALINE HCL 50 MG TABLET: 50 | 30 days supply | Qty: 30 | Fill #0

## 2017-09-01 MED FILL — LITHIUM CARBONATE 300 MG CA: 300 | 30 days supply | Qty: 120 | Fill #1

## 2017-09-05 MED FILL — LATUDA 80 MG TABLET: 80 | 30 days supply | Qty: 30 | Fill #1

## 2017-09-08 MED FILL — busPIRone HCL 30 MG TABS: 30 | 30 days supply | Qty: 60 | Fill #5

## 2017-09-19 MED FILL — SERTRALINE HCL 50 MG TABLET: 50 | 30 days supply | Qty: 30 | Fill #1

## 2017-09-27 ENCOUNTER — Encounter: Payer: Self-pay | Admitting: Physician Assistant

## 2017-09-27 ENCOUNTER — Ambulatory Visit: Payer: 59 | Admitting: Physician Assistant

## 2017-09-27 ENCOUNTER — Other Ambulatory Visit: Payer: Self-pay

## 2017-09-27 VITALS — BP 108/70 | HR 77 | Temp 98.5°F | Ht 67.0 in | Wt 232.8 lb

## 2017-09-27 DIAGNOSIS — M79604 Pain in right leg: Secondary | ICD-10-CM

## 2017-09-27 NOTE — Progress Notes (Signed)
Tara Sullivan  MRN: [id number] DOB: 10/23/1990  PCP: Morrell RiddleWeber, Janiyah Beery L, PA-C  Chief Complaint  Patient presents with  . Leg Pain    Subjective:  Pt presents to clinic for right calf pain that she has had for 3 days - seems to be getting worse. No injury to the area.  No recent travel.  No increased risk of blood clots due to family history.  Is on NuvaRing for estrogen therapy.  She does not smoke.  Patient states this feels like a charley horse, sore all the time but increased with use of calf muscle.  She has not been doing any exercise or change in activity recently.  She has done nothing for the pain.  History is obtained by patient.  Review of Systems  Cardiovascular: Negative for leg swelling.  Musculoskeletal: Positive for myalgias (Right calf). Negative for gait problem.    Patient Active Problem List   Diagnosis Date Noted  . Obesity, Class II, BMI 35-39.9 01/01/2017  . History of drug overdose 01/09/2014  . Generalized anxiety disorder 01/09/2014  . Depression 01/09/2014  . History of chlamydia 11/13/2011  . Irregular menses     Current Outpatient Medications on File Prior to Visit  Medication Sig Dispense Refill  . busPIRone (BUSPAR) 30 MG tablet Take 30 mg by mouth 2 (two) times daily.    . chlorhexidine (PERIDEX) 0.12 % solution Use as directed 15 mLs in the mouth or throat 2 (two) times daily. 120 mL 0  . clonazePAM (KLONOPIN) 1 MG tablet Take 1 mg by mouth 2 (two) times daily.    Marland Kitchen. dexamethasone 0.5 MG/5ML elixir Take 10 mLs (1 mg total) by mouth 3 (three) times daily. 100 mL 0  . etonogestrel-ethinyl estradiol (NUVARING) 0.12-0.015 MG/24HR vaginal ring Insert vaginally and leave in place for 3 consecutive weeks, then remove for 1 week. 3 each 4  . LATUDA 120 MG TABS   1  . lithium carbonate 300 MG capsule Take 300 mg by mouth 2 (two) times daily with a meal.    . mometasone (NASONEX) 50 MCG/ACT nasal spray Place 2 sprays into the nose daily. 51 g 3  .  omeprazole (PRILOSEC) 20 MG capsule Take 1 capsule (20 mg total) by mouth 2 (two) times daily before a meal. 30 capsule 1  . triamcinolone cream (KENALOG) 0.1 % Apply 1 application topically 2 (two) times daily. 60 g 0   No current facility-administered medications on file prior to visit.     Allergies  Allergen Reactions  . Latex     Swelling and burning of skin    Past Medical History:  Diagnosis Date  . Depression   . Irregular menses    Social History   Social History Narrative   Lives with parents   Social History   Tobacco Use  . Smoking status: Never Smoker  . Smokeless tobacco: Never Used  Substance Use Topics  . Alcohol use: Yes    Comment: social - no binge drinking  . Drug use: No   family history includes Alcoholism in her father; Bipolar disorder in her brother and sister; Diabetes in her paternal grandmother; Hypertension in her maternal grandfather and paternal grandmother.     Objective:  BP 108/70 (BP Location: Left Arm, Patient Position: Sitting, Cuff Size: Normal)   Pulse 77   Temp 98.5 F (36.9 C) (Oral)   Ht 5\' 7"  (1.702 m)   Wt 232 lb 12.8 oz (105.6 kg)   SpO2 97%  BMI 36.46 kg/m  Body mass index is 36.46 kg/m.  Wt Readings from Last 3 Encounters:  09/27/17 232 lb 12.8 oz (105.6 kg)  07/24/17 237 lb (107.5 kg)  06/28/17 237 lb 3.2 oz (107.6 kg)    Physical Exam  Constitutional: She is oriented to person, place, and time. She appears well-developed and well-nourished.  HENT:  Head: Normocephalic and atraumatic.  Right Ear: Hearing and external ear normal.  Left Ear: Hearing and external ear normal.  Eyes: Conjunctivae are normal.  Neck: Normal range of motion.  Cardiovascular: Normal rate, regular rhythm and normal heart sounds.  No murmur heard. Pulmonary/Chest: Effort normal and breath sounds normal. She has no wheezes.  Musculoskeletal:       Left lower leg: Normal.       Legs: Neurological: She is alert and oriented to  person, place, and time.  Skin: Skin is warm and dry.  Psychiatric: Judgment normal.  Vitals reviewed.   Assessment and Plan :  Right leg pain = patient is low risk for DVT and has an exam but does not support this diagnosis.  She will use anti-inflammatories heat and stretching to the area.  If she is not improved in 48 hours we may order an ultrasound at that point but I suspect musculoskeletal strain.  Patient verbalized to me that they understand the following: diagnosis, what is being done for them, what to expect and what should be done at home.  Their questions have been answered.  See after visit summary for patient specific instructions.  Benny Lennert PA-C  Primary Care at [facility] 09/27/2017 4:11 PM  Please note: Portions of this report may have been transcribed using dragon voice recognition software. Every effort was made to ensure accuracy; however, inadvertent computerized transcription errors may be present.

## 2017-09-27 NOTE — Patient Instructions (Signed)
     IF you received an x-ray today, you will receive an invoice from [facility]. Please contact  Radiology at [phone] with questions or concerns regarding your invoice.   IF you received labwork today, you will receive an invoice from [facility]. Please contact [facility] at [phone] with questions or concerns regarding your invoice.   Our billing staff will not be able to assist you with questions regarding bills from these companies.  You will be contacted with the lab results as soon as they are available. The fastest way to get your results is to activate your My Chart account. Instructions are located on the last page of this paperwork. If you have not heard from us regarding the results in 2 weeks, please contact this office.     

## 2017-10-03 MED FILL — LITHIUM CARBONATE 300 MG CA: 300 | 30 days supply | Qty: 120 | Fill #0

## 2017-10-17 MED FILL — LATUDA 80 MG TABLET: 80 | 30 days supply | Qty: 30 | Fill #0

## 2017-10-20 MED FILL — busPIRone HCL 30 MG TABS: 30 | 30 days supply | Qty: 60 | Fill #0

## 2017-10-22 MED FILL — NUVARING VAGINAL RING: 0.12-0.015 | 84 days supply | Qty: 3 | Fill #1

## 2017-10-30 MED FILL — SERTRALINE HCL 100 MG TAB: 100 | 30 days supply | Qty: 30 | Fill #0

## 2017-10-31 ENCOUNTER — Encounter: Payer: Self-pay | Admitting: Physician Assistant

## 2017-10-31 ENCOUNTER — Ambulatory Visit (INDEPENDENT_AMBULATORY_CARE_PROVIDER_SITE_OTHER): Payer: 59 | Admitting: Physician Assistant

## 2017-10-31 VITALS — BP 108/76 | HR 77 | Ht 67.0 in | Wt 237.0 lb

## 2017-10-31 DIAGNOSIS — G8929 Other chronic pain: Secondary | ICD-10-CM | POA: Diagnosis not present

## 2017-10-31 DIAGNOSIS — M25562 Pain in left knee: Secondary | ICD-10-CM

## 2017-10-31 MED ORDER — MELOXICAM 7.5 MG PO TABS: 7.5000 mg | ORAL_TABLET | Freq: Every day | ORAL | 0 refills | Status: DC

## 2017-10-31 NOTE — Progress Notes (Signed)
Tara Sullivan  MRN: [id number] DOB: 04/22/1990  Subjective:   Tara Sullivan is a 27 y.o. female who presents with knee pain involving the left knee. Onset was gradual, starting about 3 months ago. Inciting event: none known. Current symptoms include: popping sensation and sharp shooting.  Denies redness, warmth, swelling, fever, chills, and diffuse joint pain. Pain is aggravated by going up and down stairs and rising after sitting. Pain is at the front and medial aspect of knee. Has not increased physical activity but does stand on feet a lot at work. Patient has had no prior left knee problems. Evaluation to date: none. Treatment to date: none.  Review of Systems  Per HPI  Patient Active Problem List   Diagnosis Date Noted  . Obesity, Class II, BMI 35-39.9 01/01/2017  . History of drug overdose 01/09/2014  . Generalized anxiety disorder 01/09/2014  . Depression 01/09/2014  . History of chlamydia 11/13/2011  . Irregular menses     Current Outpatient Medications on File Prior to Visit  Medication Sig Dispense Refill  . busPIRone (BUSPAR) 30 MG tablet Take 30 mg by mouth 2 (two) times daily.    . clonazePAM (KLONOPIN) 1 MG tablet Take 1 mg by mouth 2 (two) times daily.    Marland Kitchen. etonogestrel-ethinyl estradiol (NUVARING) 0.12-0.015 MG/24HR vaginal ring Insert vaginally and leave in place for 3 consecutive weeks, then remove for 1 week. 3 each 4  . LATUDA 80 MG TABS tablet   1  . lithium carbonate 300 MG capsule Take 300 mg by mouth 2 (two) times daily with a meal.    . mometasone (NASONEX) 50 MCG/ACT nasal spray Place 2 sprays into the nose daily. 51 g 3  . sertraline (ZOLOFT) 100 MG tablet   1  . triamcinolone cream (KENALOG) 0.1 % Apply 1 application topically 2 (two) times daily. 60 g 0   No current facility-administered medications on file prior to visit.     Allergies  Allergen Reactions  . Latex     Swelling and burning of skin     Objective:  BP 108/76   Pulse 77    Ht 5\' 7"  (1.702 m)   Wt 237 lb (107.5 kg)   BMI 37.12 kg/m   Physical Exam  Constitutional: She is oriented to person, place, and time. She appears well-developed and well-nourished. No distress.  HENT:  Head: Normocephalic and atraumatic.  Eyes: Conjunctivae are normal.  Neck: Normal range of motion.  Cardiovascular:  Pulses:      Dorsalis pedis pulses are 2+ on the right side, and 2+ on the left side.       Posterior tibial pulses are 2+ on the right side, and 2+ on the left side.  Sensation of BLE intact.  Strength of BLE 5/5.   Pulmonary/Chest: Effort normal.  Musculoskeletal:       Right knee: Normal.       Left knee: She exhibits swelling (mild swelling noted in area of pes anserine bursa with overlying TTP, no overlying erythema  or warmth ). She exhibits normal range of motion, no effusion, no ecchymosis, no erythema, no LCL laxity, normal patellar mobility, no bony tenderness, normal meniscus and no MCL laxity. Tenderness found. Patellar tendon (mild TTP with palpation of patellar tendon, no overlying erythema or warmth) tenderness noted. No medial joint line, no lateral joint line, no MCL and no LCL tenderness noted.       Right lower leg: Normal. She exhibits no tenderness, no swelling  and no edema.       Left lower leg: Normal. She exhibits no tenderness, no swelling and no edema.  Neurological: She is alert and oriented to person, place, and time.  Skin: Skin is warm and dry.  Psychiatric: She has a normal mood and affect.  Vitals reviewed.   Assessment and Plan :  1. Chronic pain of left knee Pt is overall well appearing, NAD. Vitals stable. Hx and PE findings suspicious for pes anserine bursitis +/- patellar tendonitis. No known injury. Rec initial tx with rest, ice, elevation, and daily NSAIDs for at least one week. After pain improves, rec quad/hamstring stretches as tolerated. If no improvement with tx, can consider brace. If pain persists, would rec plain films.    Meds ordered this encounter  Medications  . meloxicam (MOBIC) 7.5 MG tablet    Sig: Take 1-2 tablets (7.5-15 mg total) by mouth daily.    Dispense:  30 tablet    Refill:  0    Order Specific Question:   Supervising Provider    Answer:   Georgina Quint [[id number]]   Side effects, risks, benefits, and alternatives of the medications and treatment plan prescribed today were discussed, and patient expressed understanding of the instructions given. No barriers to understanding were identified. Red flags discussed in detail. Pt expressed understanding regarding what to do in case of emergency/urgent symptoms.   Benjiman Core PA-C  Primary Care at [facility] 11/01/2017 8:49 PM

## 2017-10-31 NOTE — Patient Instructions (Signed)
Pes Anserine Bursitis  The pes anserine is an area on the inside of your knee, just below the joint, which is cushioned by a fluid-filled sac (bursa). Pes anserine bursitis is a condition that happens when this bursa gets swollen and irritated. The condition causes knee pain.  What are the causes?  This condition may be caused by:  · Making the same movement over and over.  · A hit to the inside of the leg.    What increases the risk?  This injury is most likely to develop in:  · Runners.  · Athletes who play sports that involve a lot of running and quick side-to-side movements (cutting).  · Athletes who play contact sports.  · People who swim using an inward angle of the knee, such as with the breaststroke.  · People with tight hamstring muscles.  · Females.  · People who are overweight.  · People with flat feet.  · People who have diabetes or osteoarthritis.    What are the signs or symptoms?  Symptoms of this condition include:  · Knee pain that gets better with rest and worse with activities like climbing stairs, walking, running, or getting in and out of a chair (common).  · Swelling.  · Warmth.  · Tenderness when pressing at the inside of the lower leg, just below the knee.    How is this diagnosed?  This condition may be diagnosed based on:  · Your symptoms.  · Your medical history.  · A physical exam.  · Tests, such as:  ? X-rays.  ? MRI and ultrasound. These tests are done to check for swelling and fluid buildup in the bursa and to look at muscles and tendons.    During your physical exam, your health care provider will press on the tendon attachment to see if you feel pain. He or she may also check your hip and knee motion and strength.  How is this treated?  This condition may be treated by:  · Resting your knee.  · Avoiding activities that cause pain.  · Icing the inside of your knee.  · Raising (elevating) your knee while resting.  · Sleeping with a pillow between your knees. This will cushion your  injured knee.  · Taking medicine to reduce pain and swelling.  · Having medicines injected into your knee.  · Doing strengthening and stretching exercises (physical therapy).    If these treatments do not work or if the condition keeps coming back, you may need to have surgery to remove the bursa.  Follow these instructions at home:  Managing pain, stiffness, and swelling  · If directed, apply ice to your knee.  ? Put ice in a plastic bag.  ? Place a towel between your skin and the bag.  ? Leave the ice on for 20 minutes, 2-3 times a day.  · While you are sitting, elevate your knee.  · While you are lying down, elevate your knee above the level of your heart.  · Take over-the-counter and prescription medicines only as told by your health care provider.  Activity  · Return to your normal activities as told by your health care provider. Ask your health care provider what activities are safe for you.  · Do exercises as told by your health care provider.  General instructions  · Sleep with a pillow between your knees.  · Do not use any tobacco products, such as cigarettes, chewing tobacco, and e-cigarettes. Tobacco can   delay healing. If you need help quitting, ask your health care provider.  · Keep all follow-up visits as told by your health care provider. This is important.  How is this prevented?  · Warm up and stretch before being active.  · Cool down and stretch after being active.  · Give your body time to rest between periods of activity.  · Make sure to use equipment that fits you.  · Be safe and responsible while being active to avoid falls.  · Do at least 150 minutes of moderate-intensity exercise each week, such as brisk walking or water aerobics.  · Maintain physical fitness, including:  ? Strength.  ? Flexibility.  ? Cardiovascular fitness.  ? Endurance.  Contact a health care provider if:  · Your symptoms do not improve.  · Your symptoms get worse.  This information is not intended to replace advice given  to you by your health care provider. Make sure you discuss any questions you have with your health care provider.  Document Released: 02/18/2005 Document Revised: 10/24/2015 Document Reviewed: 02/03/2015  Elsevier Interactive Patient Education © 2018 [employer]

## 2017-11-01 ENCOUNTER — Encounter: Payer: Self-pay | Admitting: Physician Assistant

## 2017-11-04 ENCOUNTER — Telehealth: Payer: Self-pay | Admitting: Physician Assistant

## 2017-11-04 NOTE — Telephone Encounter (Signed)
Copied from CRM [id number]. Topic: Quick Communication - Rx Refill/Question >> Nov 04, 2017  9:09 AM Stephannie Li, NT wrote: Medication: meloxicam (MOBIC) 7.5 MG tablet has an inter action sever interaction  with sertraline (ZOLOFT) 100 MG tablet, the Zoloft does not work as well and also cause increase the chancre of bleeding   Has the patient contacted their pharmacy? yes (Agent: If no, request that the patient contact the pharmacy for the refill. (Agent: If yes, when and what did the pharmacy advise?  Preferred Pharmacy (with phone number or street name [facility] - [place], [place] - [address] [phone] (Phone) [phone] (Fax)    Agent: Please be advised that RX refills may take up to 3 business days. We ask that you follow-up with your pharmacy.

## 2017-11-11 DIAGNOSIS — F3162 Bipolar disorder, current episode mixed, moderate: Secondary | ICD-10-CM

## 2017-11-11 MED FILL — LITHIUM CARBONATE 300 MG CA: 300 | 30 days supply | Qty: 120 | Fill #1

## 2017-11-19 MED FILL — busPIRone HCL 30 MG TABS: 30 | 30 days supply | Qty: 60 | Fill #1

## 2017-11-21 ENCOUNTER — Ambulatory Visit: Payer: 59 | Admitting: Emergency Medicine

## 2017-12-02 ENCOUNTER — Encounter: Payer: Self-pay | Admitting: Physician Assistant

## 2017-12-02 ENCOUNTER — Ambulatory Visit: Payer: 59 | Admitting: Physician Assistant

## 2017-12-02 DIAGNOSIS — F3162 Bipolar disorder, current episode mixed, moderate: Secondary | ICD-10-CM | POA: Diagnosis not present

## 2017-12-02 DIAGNOSIS — Z79899 Other long term (current) drug therapy: Secondary | ICD-10-CM

## 2017-12-02 DIAGNOSIS — F411 Generalized anxiety disorder: Secondary | ICD-10-CM | POA: Diagnosis not present

## 2017-12-02 MED ORDER — CLONAZEPAM 1 MG PO TABS: 0.5000 mg | ORAL_TABLET | Freq: Two times a day (BID) | ORAL | 2 refills | Status: DC | PRN

## 2017-12-02 MED ORDER — SERTRALINE HCL 100 MG PO TABS: 150.0000 mg | ORAL_TABLET | Freq: Every day | ORAL | 2 refills | Status: DC

## 2017-12-02 MED ORDER — BUSPIRONE HCL 30 MG PO TABS: 30.0000 mg | ORAL_TABLET | Freq: Two times a day (BID) | ORAL | 2 refills | Status: DC

## 2017-12-02 MED ORDER — LATUDA 80 MG PO TABS: 80.0000 mg | ORAL_TABLET | ORAL | 2 refills | Status: DC

## 2017-12-02 MED ORDER — LITHIUM CARBONATE 300 MG PO CAPS: 1200.0000 mg | ORAL_CAPSULE | Freq: Every day | ORAL | 2 refills | Status: DC

## 2017-12-02 MED FILL — LATUDA 80 MG TABLET: 80 | 30 days supply | Qty: 30 | Fill #0

## 2017-12-02 MED FILL — SERTRALINE HCL 100 MG TAB: 100 | 30 days supply | Qty: 45 | Fill #0

## 2017-12-02 NOTE — Progress Notes (Signed)
[facility]  Patient ID: Tara Sullivan,  MRN: [id number]  PCP: Patient, No Pcp Per  Date of Evaluation: 12/03/2017 Time spent:20 minutes   HISTORY/CURRENT STATUS: Not doing great.  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  She can't cry and she usually cries easily.  Has low energy and motivation.  She hangs out with her friends when she can.  No canceling plans.  She's been drinking heavier.  4-5 shots and 4-5 beers on one weekend night. She's had a hard time wanting to get up and go to work the past few weeks, but hasn't called out, just wanted to.  Has been more depressed for past 2 months. Anxiety has been controlled for the most part.   We increased Zoloft a month ago which helped some.   Hard time staying asleep.  Wakes up 4-5 times a night, sometimes to go to the bathroom but other times, just wakes up.  Can go back to sleep pretty well at times.   Had SI last week, but none in past few days.    Individual Medical History/ Review of Systems: Changes? :No  Allergies: Latex  Current Medications:  Current Outpatient Medications:  .  lithium carbonate 300 MG capsule, Take 4 capsules (1,200 mg total) by mouth at bedtime. TAKE 4 Q HS, Disp: 120 capsule, Rfl: 2 .  sertraline (ZOLOFT) 100 MG tablet, Take 1.5 tablets (150 mg total) by mouth daily., Disp: 45 tablet, Rfl: 2 .  busPIRone (BUSPAR) 30 MG tablet, Take 1 tablet (30 mg total) by mouth 2 (two) times daily., Disp: 60 tablet, Rfl: 2 .  clonazePAM (KLONOPIN) 1 MG tablet, Take 0.5 tablets (0.5 mg total) by mouth 2 (two) times daily as needed for anxiety., Disp: 30 tablet, Rfl: 2 .  etonogestrel-ethinyl estradiol (NUVARING) 0.12-0.015 MG/24HR vaginal ring, Insert vaginally and leave in place for 3 consecutive weeks, then remove for 1 week., Disp: 3 each, Rfl: 4 .  LATUDA 80 MG TABS tablet, Take 1 tablet (80 mg total) by mouth 1 day or 1 dose. TAKE IN THE EVENING WITH FOOD, Disp: 30 tablet, Rfl: 2 .   meloxicam (MOBIC) 7.5 MG tablet, Take 1-2 tablets (7.5-15 mg total) by mouth daily., Disp: 30 tablet, Rfl: 0 .  mometasone (NASONEX) 50 MCG/ACT nasal spray, Place 2 sprays into the nose daily., Disp: 51 g, Rfl: 3 .  triamcinolone cream (KENALOG) 0.1 %, Apply 1 application topically 2 (two) times daily., Disp: 60 g, Rfl: 0 Medication Side Effects: None  Family Medical/ Social History: Changes? No  MENTAL HEALTH EXAM:  There were no vitals taken for this visit.There is no height or weight on file to calculate BMI.  General Appearance: Well Groomed, obese  Eye Contact:  Good  Speech:  Clear and Coherent  Volume:  Normal  Mood:  Depressed  Affect:  Tearful depressed   Thought Process:  Coherent  Orientation:  Full (Time, Place, and Person)  Thought Content: Logical   Suicidal Thoughts:  Yes.  without intent/plan  Homicidal Thoughts:  No  Memory:  Immediate  Judgement:  Good  Insight:  Good  Psychomotor Activity:  Normal  Concentration:  Concentration: Good  Recall:  Good  Fund of Knowledge: Good  Language: Good  Akathisia:  No  AIMS (if indicated): not done  Assets:  Communication Skills  ADL's:  Intact  Cognition: WNL  Prognosis:  Good    DIAGNOSES:    ICD-10-CM   1. Bipolar  1 disorder, mixed, moderate (HCC) F31.62 LATUDA 80 MG TABS tablet    lithium carbonate 300 MG capsule  2. Generalized anxiety disorder F41.1 busPIRone (BUSPAR) 30 MG tablet    clonazePAM (KLONOPIN) 1 MG tablet    DISCONTINUED: clonazePAM (KLONOPIN) 1 MG tablet  3. Encounter for long-term (current) use of medications Z79.899 CBC with Differential/Platelet    Comprehensive metabolic panel    TSH    Hemoglobin A1c    Lithium level    RECOMMENDATIONS: Contract for safety.  She knows to go to [facility] or call 911 or suicide hotline if SI recur.   Increase Zoloft.  Continue Latuda.  Continue Lithium.  Continue BuSpar.  Cont Klonopin but patient warned that if she continues to drink, I won't be able to  prescribe the Klonopin.  She verbalizes understanding.  She is aware of possible side effects of this drug including sedation. Importance of having labs drawn was stressed.  A new order was given for CBC with differential CMP TSH hemoglobin A1c and lithium level to be drawn in the next week, fasting. Recommend counseling. Return in approximately 2 weeks or call sooner as needed    Melony Overly, PA-C

## 2017-12-03 ENCOUNTER — Encounter: Payer: Self-pay | Admitting: Physician Assistant

## 2017-12-03 MED ORDER — CLONAZEPAM 1 MG PO TABS: 0.5000 mg | ORAL_TABLET | Freq: Two times a day (BID) | ORAL | 2 refills | Status: DC | PRN

## 2017-12-05 MED FILL — LITHIUM CARBONATE 300 MG CA: 300 | 30 days supply | Qty: 120 | Fill #0

## 2017-12-12 ENCOUNTER — Other Ambulatory Visit: Payer: Self-pay | Admitting: Physician Assistant

## 2017-12-12 DIAGNOSIS — Z79899 Other long term (current) drug therapy: Secondary | ICD-10-CM

## 2017-12-12 NOTE — Progress Notes (Signed)
Orders Placed This Encounter  Procedures  . CBC with Differential/Platelet    Standing Status:   Future    Standing Expiration Date:   12/19/2017  . CMP14+EGFR    Standing Status:   Future    Standing Expiration Date:   12/19/2017  . TSH    Standing Status:   Future    Standing Expiration Date:   12/19/2017  . Hemoglobin A1c    Standing Status:   Future    Standing Expiration Date:   12/19/2017  . Lithium level    Standing Status:   Future    Standing Expiration Date:   12/19/2017    

## 2017-12-15 ENCOUNTER — Ambulatory Visit (INDEPENDENT_AMBULATORY_CARE_PROVIDER_SITE_OTHER): Payer: 59 | Admitting: Physician Assistant

## 2017-12-15 DIAGNOSIS — Z79899 Other long term (current) drug therapy: Secondary | ICD-10-CM

## 2017-12-15 NOTE — Progress Notes (Signed)
Lab only visit 

## 2017-12-16 LAB — CMP14+EGFR
ALBUMIN: 4 g/dL (ref 3.5–5.5)
ALT: 9 IU/L (ref 0–32)
AST: 11 IU/L (ref 0–40)
Albumin/Globulin Ratio: 1.5 (ref 1.2–2.2)
Alkaline Phosphatase: 71 IU/L (ref 39–117)
BUN/Creatinine Ratio: 10 (ref 9–23)
BUN: 8 mg/dL (ref 6–20)
CO2: 21 mmol/L (ref 20–29)
CREATININE: 0.83 mg/dL (ref 0.57–1.00)
Calcium: 9.3 mg/dL (ref 8.7–10.2)
Chloride: 102 mmol/L (ref 96–106)
GFR, EST AFRICAN AMERICAN: 110 mL/min/{1.73_m2} (ref >59)
GFR, EST NON AFRICAN AMERICAN: 96 mL/min/{1.73_m2} (ref >59)
GLOBULIN, TOTAL: 2.7 g/dL (ref 1.5–4.5)
GLUCOSE: 111 mg/dL — AB (ref 65–99)
Potassium: 4.2 mmol/L (ref 3.5–5.2)
Sodium: 139 mmol/L (ref 134–144)
Total Protein: 6.7 g/dL (ref 6.0–8.5)

## 2017-12-16 LAB — CBC WITH DIFFERENTIAL/PLATELET
BASOS: 0 %
Basophils Absolute: 0 10*3/uL (ref 0.0–0.2)
EOS (ABSOLUTE): 0.1 10*3/uL (ref 0.0–0.4)
EOS: 1 %
HEMOGLOBIN: 12.9 g/dL (ref 11.1–15.9)
Hematocrit: 39.3 % (ref 34.0–46.6)
IMMATURE GRANULOCYTES: 0 %
Immature Grans (Abs): 0 10*3/uL (ref 0.0–0.1)
LYMPHS: 23 %
Lymphocytes Absolute: 2.1 10*3/uL (ref 0.7–3.1)
MCH: 29.2 pg (ref 26.6–33.0)
MCHC: 32.8 g/dL (ref 31.5–35.7)
MCV: 89 fL (ref 79–97)
MONOCYTES: 6 %
Monocytes Absolute: 0.5 10*3/uL (ref 0.1–0.9)
NEUTROS ABS: 6.4 10*3/uL (ref 1.4–7.0)
NEUTROS PCT: 70 %
Platelets: 325 10*3/uL (ref 150–450)
RBC: 4.42 x10E6/uL (ref 3.77–5.28)
RDW: 12.3 % (ref 12.3–15.4)
WBC: 9.2 10*3/uL (ref 3.4–10.8)

## 2017-12-16 LAB — HEMOGLOBIN A1C
ESTIMATED AVERAGE GLUCOSE: 108 mg/dL
HEMOGLOBIN A1C: 5.4 % (ref 4.8–5.6)

## 2017-12-16 LAB — TSH: TSH: 2.08 u[IU]/mL (ref 0.450–4.500)

## 2017-12-16 LAB — LITHIUM LEVEL: LITHIUM LVL: 0.6 mmol/L (ref 0.6–1.2)

## 2017-12-19 DIAGNOSIS — F332 Major depressive disorder, recurrent severe without psychotic features: Secondary | ICD-10-CM | POA: Diagnosis not present

## 2017-12-25 DIAGNOSIS — F332 Major depressive disorder, recurrent severe without psychotic features: Secondary | ICD-10-CM | POA: Diagnosis not present

## 2017-12-26 DIAGNOSIS — Z0289 Encounter for other administrative examinations: Secondary | ICD-10-CM

## 2017-12-29 MED FILL — LATUDA 80 MG TABLET: 80 | 30 days supply | Qty: 30 | Fill #1

## 2018-01-07 DIAGNOSIS — F332 Major depressive disorder, recurrent severe without psychotic features: Secondary | ICD-10-CM | POA: Diagnosis not present

## 2018-01-11 ENCOUNTER — Encounter: Payer: Self-pay | Admitting: Emergency Medicine

## 2018-01-12 ENCOUNTER — Ambulatory Visit: Payer: 59 | Admitting: Physician Assistant

## 2018-01-12 ENCOUNTER — Encounter: Payer: Self-pay | Admitting: Physician Assistant

## 2018-01-12 DIAGNOSIS — F1020 Alcohol dependence, uncomplicated: Secondary | ICD-10-CM

## 2018-01-12 DIAGNOSIS — F3162 Bipolar disorder, current episode mixed, moderate: Secondary | ICD-10-CM | POA: Diagnosis not present

## 2018-01-12 DIAGNOSIS — Z79899 Other long term (current) drug therapy: Secondary | ICD-10-CM

## 2018-01-12 DIAGNOSIS — F332 Major depressive disorder, recurrent severe without psychotic features: Secondary | ICD-10-CM | POA: Diagnosis not present

## 2018-01-12 DIAGNOSIS — F411 Generalized anxiety disorder: Secondary | ICD-10-CM

## 2018-01-12 MED ORDER — BUSPIRONE HCL 30 MG PO TABS: 30.0000 mg | ORAL_TABLET | Freq: Two times a day (BID) | ORAL | 2 refills | Status: DC

## 2018-01-12 MED ORDER — SERTRALINE HCL 100 MG PO TABS: 200.0000 mg | ORAL_TABLET | Freq: Every day | ORAL | 2 refills | Status: DC

## 2018-01-12 MED ORDER — LITHIUM CARBONATE 300 MG PO CAPS: ORAL_CAPSULE | ORAL | 2 refills | Status: DC

## 2018-01-12 MED FILL — LITHIUM CARBONATE 300 MG CA: 300 | 30 days supply | Qty: 150 | Fill #0

## 2018-01-12 MED FILL — SERTRALINE HCL 100 MG TAB: 100 | 30 days supply | Qty: 60 | Fill #0

## 2018-01-12 MED FILL — busPIRone HCL 30 MG TABS: 30 | 30 days supply | Qty: 60 | Fill #0

## 2018-01-12 NOTE — Progress Notes (Signed)
[facility]  Patient ID: Tara Sullivan,  MRN: [id number]  PCP: Morrell Riddle, PA-C  Date of Evaluation: 01/12/2018 Time spent:15 minutes  Chief Complaint:  Chief Complaint    Follow-up      HISTORY/CURRENT STATUS: HPI For 6 week med check.  Not having SI in a few weeks but other than that, no improvement noted after increasing the Zoloft. Has missed a day of work last week b/c of depression.  +anhedonia, + decreased energy and motivation, sleeps a lot, maybe 10-12 hours/day.    Patient denies increased energy with decreased need for sleep, no increased talkativeness, no racing thoughts, no impulsivity or risky behaviors, + increased spending with clothes,  no increased libido, no grandiosity.  Not having a lot of anxiety.  Not taken the Klonopin in "I don't know how long."   States she is drinking quite a bit.  "I do not know how much I am drinking."  Reports that it is enough to get drunk on Friday and Saturday night but does not drink during the week.  Individual Medical History/ Review of Systems: Changes? :No    Past medications for mental health diagnoses include: Seroquel, Geodon, Latuda, Wellbutrin, Pristiq, BuSpar, Klonopin  Allergies: Latex  Current Medications:  Current Outpatient Medications:  .  busPIRone (BUSPAR) 30 MG tablet, Take 1 tablet (30 mg total) by mouth 2 (two) times daily., Disp: 60 tablet, Rfl: 2 .  clonazePAM (KLONOPIN) 1 MG tablet, Take 0.5 tablets (0.5 mg total) by mouth 2 (two) times daily as needed for anxiety., Disp: 30 tablet, Rfl: 2 .  etonogestrel-ethinyl estradiol (NUVARING) 0.12-0.015 MG/24HR vaginal ring, Insert vaginally and leave in place for 3 consecutive weeks, then remove for 1 week., Disp: 3 each, Rfl: 4 .  LATUDA 80 MG TABS tablet, Take 1 tablet (80 mg total) by mouth 1 day or 1 dose. TAKE IN THE EVENING WITH FOOD, Disp: 30 tablet, Rfl: 2 .  lithium carbonate 300 MG capsule, TAKE 5 qhs, Disp: 150 capsule, Rfl: 2 .   sertraline (ZOLOFT) 100 MG tablet, Take 2 tablets (200 mg total) by mouth daily., Disp: 60 tablet, Rfl: 2 .  meloxicam (MOBIC) 7.5 MG tablet, Take 1-2 tablets (7.5-15 mg total) by mouth daily. (Patient not taking: Reported on 01/12/2018), Disp: 30 tablet, Rfl: 0 .  mometasone (NASONEX) 50 MCG/ACT nasal spray, Place 2 sprays into the nose daily. (Patient not taking: Reported on 01/12/2018), Disp: 51 g, Rfl: 3 .  triamcinolone cream (KENALOG) 0.1 %, Apply 1 application topically 2 (two) times daily. (Patient not taking: Reported on 01/12/2018), Disp: 60 g, Rfl: 0 Medication Side Effects: none  Family Medical/ Social History: Changes? No  MENTAL HEALTH EXAM:  There were no vitals taken for this visit.There is no height or weight on file to calculate BMI.  General Appearance: Casual  Eye Contact:  Good  Speech:  Clear and Coherent  Volume:  Normal  Mood:  Depressed  Affect:  Depressed  Thought Process:  Goal Directed  Orientation:  Full (Time, Place, and Person)  Thought Content: Logical   Suicidal Thoughts:  No  Homicidal Thoughts:  No  Memory:  WNL  Judgement:  Good  Insight:  Good  Psychomotor Activity:  Normal  Concentration:  Concentration: Good  Recall:  Good  Fund of Knowledge: Good  Language: Good  Assets:  Desire for Improvement  ADL's:  Intact  Cognition: WNL  Prognosis:  Good  12/15/2017 lithium level was 0.6 CBC with differential within normal  limits CMP showed glucose of 111 otherwise within normal limits Hemoglobin A1c 5.4 TSH 2.08   DIAGNOSES:    ICD-10-CM   1. Encounter for long-term (current) use of medications Z79.899 Lithium level  2. Generalized anxiety disorder F41.1 busPIRone (BUSPAR) 30 MG tablet    sertraline (ZOLOFT) 100 MG tablet  3. Bipolar 1 disorder, mixed, moderate (HCC) F31.62 lithium carbonate 300 MG capsule  4. Alcohol use disorder, moderate, dependence (HCC) F10.20     Receiving Psychotherapy: Yes Kerri Perches   RECOMMENDATIONS: We  discussed her drinking.  She really needs to stop or at least cut way back.  She knows not to mix the Klonopin along with alcohol. Increase Zoloft to 200 mg total. Increase lithium to 1500 mg total and recheck lithium level in 5 to 7 days. Continue BuSpar and Latuda.   Melony Overly, PA-C

## 2018-01-15 ENCOUNTER — Telehealth: Payer: Self-pay | Admitting: Physician Assistant

## 2018-01-15 NOTE — Telephone Encounter (Signed)
Pt states that Klonopin was reduced to .5mg  and it is not helping with her anxiety now. She is having panic attacks during the day now.

## 2018-01-15 NOTE — Telephone Encounter (Signed)
Last office visit 01/12/2018, looks like you had made some changes.   Please advise

## 2018-01-15 NOTE — Telephone Encounter (Signed)
She told me she was not having anxiety and according to the chart, she could not tell me the last time she had even taken Klonopin.  I do not want to give her a higher quantity or a higher dose.

## 2018-01-16 NOTE — Telephone Encounter (Signed)
Spoke to Pt. Aware of information. Will discuss at next appt.

## 2018-01-19 DIAGNOSIS — F332 Major depressive disorder, recurrent severe without psychotic features: Secondary | ICD-10-CM | POA: Diagnosis not present

## 2018-01-30 DIAGNOSIS — F332 Major depressive disorder, recurrent severe without psychotic features: Secondary | ICD-10-CM | POA: Diagnosis not present

## 2018-02-02 MED FILL — LATUDA 80 MG TABLET: 80 | 30 days supply | Qty: 30 | Fill #2

## 2018-02-05 DIAGNOSIS — F332 Major depressive disorder, recurrent severe without psychotic features: Secondary | ICD-10-CM | POA: Diagnosis not present

## 2018-02-12 MED FILL — LATUDA 80 MG TABLET: 80 | 30 days supply | Qty: 30 | Fill #2

## 2018-02-13 DIAGNOSIS — F332 Major depressive disorder, recurrent severe without psychotic features: Secondary | ICD-10-CM | POA: Diagnosis not present

## 2018-02-16 ENCOUNTER — Telehealth: Payer: Self-pay | Admitting: Physician Assistant

## 2018-02-16 NOTE — Telephone Encounter (Signed)
Pt.called and said that she is having a problem with the meds she takes in the pm. She is having a hard time driving and very sleepy.

## 2018-02-16 NOTE — Telephone Encounter (Signed)
Please triage.  Does she mean the Latuda?

## 2018-02-16 NOTE — Telephone Encounter (Signed)
If it's not helping, have her stop the Buspar.  I need to see her before we make any other changes though.

## 2018-02-17 NOTE — Telephone Encounter (Signed)
Left voice mail to call back 

## 2018-02-17 NOTE — Telephone Encounter (Signed)
Pt.notified

## 2018-02-23 ENCOUNTER — Encounter: Payer: Self-pay | Admitting: Physician Assistant

## 2018-02-23 ENCOUNTER — Ambulatory Visit: Payer: 59 | Admitting: Physician Assistant

## 2018-02-23 DIAGNOSIS — F1011 Alcohol abuse, in remission: Secondary | ICD-10-CM

## 2018-02-23 DIAGNOSIS — F3162 Bipolar disorder, current episode mixed, moderate: Secondary | ICD-10-CM | POA: Diagnosis not present

## 2018-02-23 DIAGNOSIS — F319 Bipolar disorder, unspecified: Secondary | ICD-10-CM

## 2018-02-23 DIAGNOSIS — Z79899 Other long term (current) drug therapy: Secondary | ICD-10-CM | POA: Diagnosis not present

## 2018-02-23 DIAGNOSIS — F411 Generalized anxiety disorder: Secondary | ICD-10-CM | POA: Diagnosis not present

## 2018-02-23 MED ORDER — LURASIDONE HCL 80 MG PO TABS: 80.0000 mg | ORAL_TABLET | Freq: Every day | ORAL | 2 refills | Status: DC

## 2018-02-23 MED ORDER — LAMOTRIGINE 25 MG PO TABS: ORAL_TABLET | ORAL | 1 refills | Status: DC

## 2018-02-23 MED ORDER — CLONAZEPAM 1 MG PO TABS: 1.0000 mg | ORAL_TABLET | Freq: Two times a day (BID) | ORAL | 1 refills | Status: DC | PRN

## 2018-02-23 MED FILL — clonazePAM 1 MG TABS: 1 | 23 days supply | Qty: 45 | Fill #0

## 2018-02-23 MED FILL — lamoTRIgine 25 MG TABS: 25 | 30 days supply | Qty: 45 | Fill #0

## 2018-02-23 NOTE — Progress Notes (Signed)
[facility]  Patient ID: Tara Sullivan,  MRN: [id number]  PCP: Morrell RiddleWeber, Sarah L, PA-C  Date of Evaluation: 02/23/2018 Time spent:25 minutes  Chief Complaint:  Chief Complaint    Anxiety      HISTORY/CURRENT STATUS:  HPI Here for routine med check.  Not doing so well.  Has had low motivation and not much energy for awhile.  "I'm a little better than I was. I get anxious at work. And I don't like to be around a lot of people." + isolating.  Stays home in bed when not working.  Hasn't missed any work since Nov.  Missed 2 days then. Felt better after taking that time off. Not doing much for fun, but her mom surprised the family with a 776 wk old mini Schnauzer yesterday!  She's excited about that.  More depressed at home.  More anxious at work.  We stopped the BuSpar as it did not seem to be helping.  It is always triggered by something at work.  States that she has not been good about taking the lithium consistently for about 2 weeks.  Has not had the motivation.  It has helped with the mood swings though and she has been taking it consistently for 3 days now.  She plans to get back in habit of taking it daily.  She has not had a lithium level drawn because she knew it would not be accurate.  Patient denies increased energy with decreased need for sleep, no increased talkativeness, no racing thoughts, no impulsivity or risky behaviors, no increased spending, no increased libido, no grandiosity.  She has been sleeping well.  She has not had any alcohol in 2 weeks.  "I am really trying to quit.  Klonopin helps when I get anxious and I do not want the alcohol."  She denies tremor, tics, falls, or gait disturbances.  Individual Medical History/ Review of Systems: Changes? :No    Past medications for mental health diagnoses include: BuSpar, Seroquel, Geodon, Latuda, Wellbutrin caused insomnia, Pristiq, Klonopin, lithium  Allergies: Latex  Current Medications:  Current  Outpatient Medications:  .  clonazePAM (KLONOPIN) 1 MG tablet, Take 0.5 tablets (0.5 mg total) by mouth 2 (two) times daily as needed for anxiety. (Patient taking differently: Take 1 mg by mouth 2 (two) times daily as needed for anxiety. ), Disp: 30 tablet, Rfl: 2 .  etonogestrel-ethinyl estradiol (NUVARING) 0.12-0.015 MG/24HR vaginal ring, Insert vaginally and leave in place for 3 consecutive weeks, then remove for 1 week., Disp: 3 each, Rfl: 4 .  lithium carbonate 300 MG capsule, TAKE 5 qhs, Disp: 150 capsule, Rfl: 2 .  lurasidone (LATUDA) 80 MG TABS tablet, Take 1 tablet (80 mg total) by mouth daily with supper. TAKE IN THE EVENING WITH FOOD, Disp: 30 tablet, Rfl: 2 .  sertraline (ZOLOFT) 100 MG tablet, Take 2 tablets (200 mg total) by mouth daily., Disp: 60 tablet, Rfl: 2 .  clonazePAM (KLONOPIN) 1 MG tablet, Take 1 tablet (1 mg total) by mouth 2 (two) times daily as needed for anxiety., Disp: 45 tablet, Rfl: 1 .  lamoTRIgine (LAMICTAL) 25 MG tablet, Take 1 po q evening for 2 weeks, then increase to 2 po q evening., Disp: 60 tablet, Rfl: 1 Medication Side Effects: none  Family Medical/ Social History: Changes? No  MENTAL HEALTH EXAM:  There were no vitals taken for this visit.There is no height or weight on file to calculate BMI.  General Appearance: Casual, Well Groomed and Obese  Eye Contact:  Good  Speech:  Clear and Coherent  Volume:  Normal  Mood:  Depressed  Affect:  Depressed  Thought Process:  Goal Directed  Orientation:  Full (Time, Place, and Person)  Thought Content: Logical   Suicidal Thoughts:  No  Homicidal Thoughts:  No  Memory:  WNL  Judgement:  Good  Insight:  Good  Psychomotor Activity:  Normal gait is normal, no tics or tremors.  Concentration:  Concentration: Good and Attention Span: Good  Recall:  Good  Fund of Knowledge: Good  Language: Good  Assets:  Desire for Improvement  ADL's:  Intact  Cognition: WNL  Prognosis:  Good    DIAGNOSES:    ICD-10-CM    1. Bipolar I disorder (HCC) F31.9   2. Generalized anxiety disorder F41.1   3. 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 Bipolar 1 disorder, mixed, moderate (HCC) F31.62   5. Encounter for long-term (current) use of medications Z79.899 Lithium level    Receiving Psychotherapy: Yes [name], patient cannot remember her last name.   RECOMMENDATIONS: Congratulations on 2 weeks of sobriety! Options for helping with anxiety and depression would be to increase Zoloft.  She is having mild sweats already so I prefer not to increase that. Another option would be adding Lamictal.  Start Lamictal 25 mg nightly for 2 weeks then increase to 2 pills nightly. Counseled patient regarding potential benefits, risks, and side effects of Lamictal to include potential risk of Stevens-Johnson syndrome. Advised patient to stop taking Lamictal and contact office immediately if rash develops and to seek urgent medical attention if rash is severe and/or spreading quickly.  Patient understands and accepts these risks. Continue Klonopin 1 mg twice daily as needed extreme anxiety.  Her mother keeps the medicine and only allows her to have 4 at a time.  She normally only takes 1/day. Be more consistent about taking the lithium.  She is at 1500 mg. Continue Latuda 80 mg q. evening with food. Continue Zoloft 200 mg total. Lithium level to be done on 02/27/2018 or no later than 03/02/2018 Return in 1 month.     Melony Overlyeresa Thayden Lemire, PA-C

## 2018-03-04 DIAGNOSIS — F332 Major depressive disorder, recurrent severe without psychotic features: Secondary | ICD-10-CM | POA: Diagnosis not present

## 2018-03-10 DIAGNOSIS — F332 Major depressive disorder, recurrent severe without psychotic features: Secondary | ICD-10-CM | POA: Diagnosis not present

## 2018-03-10 MED FILL — LATUDA 80 MG TABLET: 80 | 30 days supply | Qty: 30 | Fill #0

## 2018-03-10 MED FILL — SERTRALINE HCL 100 MG TAB: 100 | 30 days supply | Qty: 60 | Fill #1

## 2018-03-10 MED FILL — LITHIUM CARBONATE 300 MG CA: 300 | 30 days supply | Qty: 150 | Fill #1

## 2018-03-13 ENCOUNTER — Other Ambulatory Visit: Payer: Self-pay | Admitting: Physician Assistant

## 2018-03-13 ENCOUNTER — Telehealth: Payer: Self-pay | Admitting: Physician Assistant

## 2018-03-13 MED ORDER — NALTREXONE HCL 50 MG PO TABS: 50.0000 mg | ORAL_TABLET | ORAL | 1 refills | Status: DC

## 2018-03-13 MED FILL — NALTREXONE 50 MG TABLET: 50 | 30 days supply | Qty: 30 | Fill #0

## 2018-03-13 NOTE — Telephone Encounter (Signed)
Please confirm, do not see that medication listed in notes, also have paper chart if you need to review.

## 2018-03-13 NOTE — Telephone Encounter (Signed)
Pt.notified

## 2018-03-13 NOTE — Telephone Encounter (Signed)
Pt. Called to say that she needs a new script of naltrexone the one you gave her expired. Please escribe to [facility]

## 2018-03-13 NOTE — Telephone Encounter (Signed)
I sent it in.  The original was 10/18!

## 2018-03-14 ENCOUNTER — Ambulatory Visit: Payer: Self-pay | Admitting: Physician Assistant

## 2018-03-16 ENCOUNTER — Ambulatory Visit (INDEPENDENT_AMBULATORY_CARE_PROVIDER_SITE_OTHER): Payer: 59 | Admitting: Emergency Medicine

## 2018-03-16 DIAGNOSIS — Z79899 Other long term (current) drug therapy: Secondary | ICD-10-CM | POA: Diagnosis not present

## 2018-03-17 ENCOUNTER — Telehealth: Payer: Self-pay | Admitting: Physician Assistant

## 2018-03-17 LAB — LITHIUM LEVEL: Lithium Lvl: 0.4 mmol/L — ABNORMAL LOW (ref 0.6–1.2)

## 2018-03-17 NOTE — Telephone Encounter (Signed)
Pt given information and verbalized understanding. Instructed to call back with any questions or problems.

## 2018-03-17 NOTE — Telephone Encounter (Signed)
Have her add NAC OTC directions. Cont Klonopon.  Increase Zoloft to 250mg .

## 2018-03-17 NOTE — Progress Notes (Signed)
Labs only

## 2018-03-17 NOTE — Telephone Encounter (Signed)
Pt. Called and said that she has been taking the clonazapam for a month now. Her stomach hurts,she is biting her nails and anxiety is still very high. Next appoinment is jan 24 th

## 2018-03-18 NOTE — Progress Notes (Signed)
Tara Sullivan, please call pt and make sure she's consistently taking the Li.  That's been problem before.  Also, what time did she take the Li the night before she had this drawn around 11:20? If it was a lot earlier (like 5 or something) we should keep her on the same dose.  Or if she feels better, lets leave it the same.  Thanks

## 2018-03-18 NOTE — Progress Notes (Signed)
Thanks Dr. Creta Levin.  I'll ask our nurse to call her about this.

## 2018-03-19 ENCOUNTER — Telehealth: Payer: Self-pay

## 2018-03-19 DIAGNOSIS — F332 Major depressive disorder, recurrent severe without psychotic features: Secondary | ICD-10-CM | POA: Diagnosis not present

## 2018-03-19 NOTE — Telephone Encounter (Signed)
Spoke with pt and she said she "probably" missed doses prior to blood draw. Instructed to take nightly and not miss doses. Keep dose the same. Takes lithium around 10pm and had drawn around 9-9:30 she said.

## 2018-03-19 NOTE — Telephone Encounter (Signed)
-----   Message from Cherie Ouch, PA-C sent at 03/18/2018 12:44 PM EST ----- Gloris Manchester, please call pt and make sure she's consistently taking the Li.  That's been problem before.  Also, what time did she take the Li the night before she had this drawn around 11:20? If it was a lot earlier (like 5 or something) we should keep her  on the same dose.  Or if she feels better, lets leave it the same.  Thanks

## 2018-03-27 ENCOUNTER — Encounter: Payer: Self-pay | Admitting: Physician Assistant

## 2018-03-27 ENCOUNTER — Ambulatory Visit: Payer: 59 | Admitting: Physician Assistant

## 2018-03-27 DIAGNOSIS — R45851 Suicidal ideations: Secondary | ICD-10-CM

## 2018-03-27 DIAGNOSIS — F3130 Bipolar disorder, current episode depressed, mild or moderate severity, unspecified: Secondary | ICD-10-CM | POA: Diagnosis not present

## 2018-03-27 DIAGNOSIS — F1020 Alcohol dependence, uncomplicated: Secondary | ICD-10-CM

## 2018-03-27 DIAGNOSIS — F332 Major depressive disorder, recurrent severe without psychotic features: Secondary | ICD-10-CM | POA: Diagnosis not present

## 2018-03-27 DIAGNOSIS — F411 Generalized anxiety disorder: Secondary | ICD-10-CM

## 2018-03-27 DIAGNOSIS — Z915 Personal history of self-harm: Secondary | ICD-10-CM | POA: Diagnosis not present

## 2018-03-27 DIAGNOSIS — F319 Bipolar disorder, unspecified: Secondary | ICD-10-CM

## 2018-03-27 DIAGNOSIS — Z91410 Personal history of adult physical and sexual abuse: Secondary | ICD-10-CM | POA: Diagnosis not present

## 2018-03-27 DIAGNOSIS — G479 Sleep disorder, unspecified: Secondary | ICD-10-CM

## 2018-03-27 NOTE — Progress Notes (Signed)
[facility]  Patient ID: Tara Sullivan,  MRN: [id number]  PCP: Morrell Riddle, PA-C  Date of Evaluation: 03/27/2018 Time spent:45 minutes  Chief Complaint:  Chief Complaint    Follow-up      HISTORY/CURRENT STATUS: HPI patient states she is not doing well.  She is accompanied by her mom [name].  Patient reports that on Monday of this week, she got in trouble at work because her supervisor said she was sleeping on the job.  Patient states she was not sleeping, that she was resting her eyes for a few minutes.  That same day, several people got fired.  Back last summer, someone took a picture of her when she had her eyes closed and reported that she was sleeping on the job.  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  She is extremely upset about the situation and is afraid she will lose her job.  States she is trying really hard not to drink.  Up until Monday, she had been without any alcohol for 1 week.  Then after the incident happened she went to a bar and drank 7 or 8 drinks.  She did stay with a friend that evening and states she does not ever drive when she is in that condition.  Reports being depressed.  Does not enjoy things.  Energy and motivation are low.  She would prefer to be by herself.  Patient denies increased energy with decreased need for sleep, no increased talkativeness, no racing thoughts, no impulsivity or risky behaviors, no increased spending, no increased libido, no grandiosity.  She has been very anxious.  Work is an Insurance account manager.  She reports not sleeping well.  She wakes up tired every day.  She never feels rested.  She does not snore, verified by her mom.  Patient is overweight.  She denies falling asleep while driving, sitting watching TV, sitting at a red light, or having a conversation.  "I am feeling pretty suicidal."  When asked about a plan, she states she is going to buy a whole bunch of Tylenol and  take them all at the same time.  She denies any homicidal thoughts.  Individual Medical History/ Review of Systems: Changes? :No    Past medications for mental health diagnoses include: melatonin, Zoloft, Seroquel, Geodon, Latuda, Wellbutrin, BuSpar, Pristiq, Klonopin  Allergies: Latex  Current Medications:  Current Outpatient Medications:  .  clonazePAM (KLONOPIN) 1 MG tablet, Take 1 tablet (1 mg total) by mouth 2 (two) times daily as needed for anxiety., Disp: 45 tablet, Rfl: 1 .  lamoTRIgine (LAMICTAL) 25 MG tablet, Take 1 po q evening for 2 weeks, then increase to 2 po q evening., Disp: 60 tablet, Rfl: 1 .  lithium carbonate 300 MG capsule, TAKE 5 qhs, Disp: 150 capsule, Rfl: 2 .  lurasidone (LATUDA) 80 MG TABS tablet, Take 1 tablet (80 mg total) by mouth daily with supper. TAKE IN THE EVENING WITH FOOD, Disp: 30 tablet, Rfl: 2 .  naltrexone (DEPADE) 50 MG tablet, Take 1 tablet (50 mg total) by mouth every morning., Disp: 30 tablet, Rfl: 1 .  sertraline (ZOLOFT) 100 MG tablet, Take 2 tablets (200 mg total) by mouth daily., Disp: 60 tablet, Rfl: 2 .  etonogestrel-ethinyl estradiol (NUVARING) 0.12-0.015 MG/24HR vaginal ring, Insert vaginally and leave in place for 3 consecutive weeks, then remove for 1 week., Disp: 3 each, Rfl: 4 Medication Side Effects: hypersomnolence  Family Medical/ Social History:  Changes? No  MENTAL HEALTH EXAM:  There were no vitals taken for this visit.There is no height or weight on file to calculate BMI.  General Appearance: Casual, Well Groomed and Obese  Eye Contact:  Good  Speech:  Clear and Coherent  Volume:  Normal  Mood:  Euthymic  Affect:  Appropriate  Thought Process:  Goal Directed  Orientation:  Full (Time, Place, and Person)  Thought Content: Logical   Suicidal Thoughts:  Yes.  with intent/plan  Homicidal Thoughts:  No  Memory:  WNL  Judgement:  Good  Insight:  Good  Psychomotor Activity:  Normal  Concentration:  Concentration: Good   Recall:  Good  Fund of Knowledge: Good  Language: Good  Assets:  Desire for Improvement  ADL's:  Intact  Cognition: WNL  Prognosis:  Good    DIAGNOSES:    ICD-10-CM   1. Suicidal ideation R45.851   2. Bipolar I disorder (HCC) F31.9   3. Alcohol use disorder, moderate, dependence (HCC) F10.20   4. Generalized anxiety disorder F41.1     Receiving Psychotherapy: Yes    RECOMMENDATIONS: I spent 45 minutes with the patient and her mom and at least 50% of that time was in counseling concerning her diagnosis and treatment options.   Because she has active suicidal thoughts with a plan, she must go to the emergency room and be evaluated for psych admission.  Her mom agrees and will make sure she gets there.  Patient is willing to go.  They would like to go somewhere besides [facility] at this time and I have recommended [facility] [place].  I gave them the address and phone number and they will go to the ER there. I will fill out FMLA papers for this continuous leave when I receive them. I will also write a letter to her supervisor stating that the patient is on medications that can cause drowsiness and as we are trying to find the right medications and dosages, she might have some drowsiness during the day. I recommend sleep study.  We will set that up when she gets out of the hospital. For now, continue current medications. She will set up an appointment when appropriate after she leaves the hospital.   Melony Overlyeresa Lyndon Chapel, PA-C

## 2018-04-01 ENCOUNTER — Telehealth: Payer: Self-pay | Admitting: Physician Assistant

## 2018-04-01 NOTE — Telephone Encounter (Signed)
Mom said the provider was going to get the [facility] paperwork, but I explained the employer needs to fax over the paperwork for provider to fill out. Mom did not realize this per OV with provider on Friday 03/27/2018. Instructed her to have them fax over asap, provider will complete.  She was admitted Friday night.

## 2018-04-01 NOTE — Telephone Encounter (Signed)
Please triage. (I have her chart if needed. ) I saw her last Fri.  Sent her to [facility].  See if they kept her and when FMLA needs to start/end.

## 2018-04-01 NOTE — Telephone Encounter (Signed)
Patient's mom called wanting to talk to you about the fmla paperwork for Sheryl. Please give her a call back at [phone]

## 2018-04-02 DIAGNOSIS — Z91410 Personal history of adult physical and sexual abuse: Secondary | ICD-10-CM | POA: Diagnosis not present

## 2018-04-02 DIAGNOSIS — F3130 Bipolar disorder, current episode depressed, mild or moderate severity, unspecified: Secondary | ICD-10-CM | POA: Diagnosis not present

## 2018-04-02 DIAGNOSIS — Z915 Personal history of self-harm: Secondary | ICD-10-CM | POA: Diagnosis not present

## 2018-04-02 DIAGNOSIS — R45851 Suicidal ideations: Secondary | ICD-10-CM | POA: Diagnosis not present

## 2018-04-03 ENCOUNTER — Telehealth: Payer: Self-pay | Admitting: Physician Assistant

## 2018-04-03 NOTE — Telephone Encounter (Signed)
Pt wants lithium 300mg  called in so she can just take 1 pill twice a day. Just got out of [facility] and they gave her 300mg  Lithium. [facility] [place].

## 2018-04-06 ENCOUNTER — Other Ambulatory Visit: Payer: Self-pay

## 2018-04-06 DIAGNOSIS — F3162 Bipolar disorder, current episode mixed, moderate: Secondary | ICD-10-CM

## 2018-04-06 DIAGNOSIS — F332 Major depressive disorder, recurrent severe without psychotic features: Secondary | ICD-10-CM | POA: Diagnosis not present

## 2018-04-06 MED ORDER — LITHIUM CARBONATE 300 MG PO CAPS: ORAL_CAPSULE | ORAL | 0 refills | Status: DC

## 2018-04-06 MED FILL — LITHIUM CARBONATE 300 MG CA: 300 | 75 days supply | Qty: 150 | Fill #0

## 2018-04-06 NOTE — Telephone Encounter (Signed)
Submitted to pharmacy as requested.

## 2018-04-08 ENCOUNTER — Encounter: Payer: Self-pay | Admitting: Physician Assistant

## 2018-04-08 DIAGNOSIS — Z0289 Encounter for other administrative examinations: Secondary | ICD-10-CM

## 2018-04-08 MED FILL — LATUDA 80 MG TABLET: 80 | 30 days supply | Qty: 30 | Fill #1

## 2018-04-08 MED FILL — lamoTRIgine 25 MG TABS: 25 | 30 days supply | Qty: 60 | Fill #1

## 2018-04-13 ENCOUNTER — Encounter: Payer: Self-pay | Admitting: Physician Assistant

## 2018-04-13 ENCOUNTER — Ambulatory Visit: Payer: 59 | Admitting: Physician Assistant

## 2018-04-13 DIAGNOSIS — F411 Generalized anxiety disorder: Secondary | ICD-10-CM

## 2018-04-13 DIAGNOSIS — Z79899 Other long term (current) drug therapy: Secondary | ICD-10-CM | POA: Diagnosis not present

## 2018-04-13 DIAGNOSIS — G479 Sleep disorder, unspecified: Secondary | ICD-10-CM

## 2018-04-13 DIAGNOSIS — F314 Bipolar disorder, current episode depressed, severe, without psychotic features: Secondary | ICD-10-CM

## 2018-04-13 NOTE — Progress Notes (Signed)
[facility]  Patient ID: Tara Sullivan,  MRN: [id number]  PCP: Morrell RiddleWeber, Sarah L, PA-C  Date of Evaluation: 04/13/18 Time spent:25 minutes  Chief Complaint:  Chief Complaint    Depression; Follow-up      HISTORY/CURRENT STATUS: HPI For hospital f/u.   Spent a week [facility] for SI.  Was weaned off Zoloft and Dierdre SearlesLi was increased.  States that since lithium was increased to twice daily, she has been very nauseated to the point that she is unable to take some of her other medications or eat because of the severe nausea.  She states she is taken a pregnancy test which was negative.  Upon further questioning, she reports that she is taking 10 lithium tablets (total per day )in order to get 300 mg twice daily.  States that was the way she had to do it in the past in order to get that many milligrams.  She denies diarrhea, dizziness, abdominal pain or vomiting, no weakness, no tremors, muscle twitches or rigidity, no confusion or agitation or other symptoms of lithium toxicity.  Says that the experience at [facility] was awful.  She only saw counselor one-on-one once, the day before she was discharged.  Did not feel it was helpful at all.  However she does feel a little bit less pressed and anxious and denies any suicidal thoughts at this point.  She did go back to work for a day and a half or so after she was released from the hospital but had extreme anxiety and depression related to that and I have kept her out of work for 2 weeks.  We have also filled out FMLA for the intermittent leave as well.  Anxiety is controlled with medication and when she is not at work.  Her boss has written her up again for some reason.  Patient says "it seems she thinks I cannot do anything right."  Patient sleeps pretty well.  Patient denies increased energy with decreased need for sleep, no increased talkativeness, no racing thoughts, no impulsivity or risky behaviors, no increased spending, no  increased libido, no grandiosity.  Denies muscle or joint pain, stiffness, or dystonia.  Denies dizziness, syncope, seizures, numbness, tingling, tremor, tics, unsteady gait, slurred speech, confusion.   Individual Medical History/ Review of Systems: Changes? :Yes see notes from [facility]  Allergies: Latex  Current Medications:  Current Outpatient Medications:  .  clonazePAM (KLONOPIN) 1 MG tablet, Take 1 tablet (1 mg total) by mouth 2 (two) times daily as needed for anxiety., Disp: 45 tablet, Rfl: 1 .  etonogestrel-ethinyl estradiol (NUVARING) 0.12-0.015 MG/24HR vaginal ring, Insert vaginally and leave in place for 3 consecutive weeks, then remove for 1 week., Disp: 3 each, Rfl: 4 .  lamoTRIgine (LAMICTAL) 25 MG tablet, Take 1 po q evening for 2 weeks, then increase to 2 po q evening., Disp: 60 tablet, Rfl: 1 .  lithium carbonate 300 MG capsule, One capsule twice a day, Disp: 150 capsule, Rfl: 0 .  lurasidone (LATUDA) 80 MG TABS tablet, Take 1 tablet (80 mg total) by mouth daily with supper. TAKE IN THE EVENING WITH FOOD, Disp: 30 tablet, Rfl: 2 .  hydrOXYzine (VISTARIL) 25 MG capsule, TAKE 1 CAPSULE BY MOUTH EVERY 6 HOURS AS NEEDED, Disp: , Rfl:  .  naltrexone (DEPADE) 50 MG tablet, Take 1 tablet (50 mg total) by mouth every morning. (Patient not taking: Reported on 04/13/2018), Disp: 30 tablet, Rfl: 1 Medication Side Effects: nausea  Family Medical/  Social History: Changes? No  MENTAL HEALTH EXAM:  There were no vitals taken for this visit.There is no height or weight on file to calculate BMI.  General Appearance: Casual, Well Groomed and Obese  Eye Contact:  Good  Speech:  Clear and Coherent  Volume:  Normal  Mood:  Depressed  Affect:  Depressed  Thought Process:  Goal Directed  Orientation:  Full (Time, Place, and Person)  Thought Content: Logical   Suicidal Thoughts:  No  Homicidal Thoughts:  No  Memory:  WNL  Judgement:  Good  Insight:  Good  Psychomotor Activity:   Normal  Concentration:  Concentration: Fair  Recall:  Good  Fund of Knowledge: Good  Language: Good  Assets:  Desire for Improvement  ADL's:  Intact  Cognition: WNL  Prognosis:  Good    DIAGNOSES:    ICD-10-CM   1. Bipolar disorder with severe depression (HCC) F31.4   2. Encounter for long-term (current) use of medications Z79.899 Lithium level    Basic metabolic panel    TSH  3. Generalized anxiety disorder F41.1   4. Sleep disturbance G47.9     Receiving Psychotherapy: Yes    RECOMMENDATIONS: I spent 25 minutes with the patient and 50% of that time was in counseling concerning her recent hospitalization, diagnosis, and different treatment options. PDMP was reviewed. Patient to hold lithium and get lithium level, BMP, and TSH tomorrow morning. Continue Latuda 80 mg q. evening. We will await lab work before making a decision on the lithium. Continue Lamictal 25 mg 2 nightly.  Depending on the lab work and how she is feeling, we may need to increase the Lamictal.  We will let her know further instructions. Continue hydroxyzine 25 mg every 6 hours as needed. Continue Klonopin 1 mg twice daily as needed. She has an appointment in a couple of weeks so we will keep that.  Call sooner if problems arise.   Melony Overly, PA-C

## 2018-04-14 ENCOUNTER — Ambulatory Visit (INDEPENDENT_AMBULATORY_CARE_PROVIDER_SITE_OTHER): Payer: 59 | Admitting: Family Medicine

## 2018-04-14 ENCOUNTER — Telehealth: Payer: Self-pay | Admitting: Family Medicine

## 2018-04-14 DIAGNOSIS — Z5181 Encounter for therapeutic drug level monitoring: Secondary | ICD-10-CM

## 2018-04-14 DIAGNOSIS — F332 Major depressive disorder, recurrent severe without psychotic features: Secondary | ICD-10-CM | POA: Diagnosis not present

## 2018-04-14 DIAGNOSIS — Z79899 Other long term (current) drug therapy: Secondary | ICD-10-CM

## 2018-04-14 NOTE — Telephone Encounter (Signed)
On lithium, followed by psychiatrist - Melony Overly.  Requests lithium level. Had recent dose increase - has discussed sx's with psychiatric care provider. Needs lithium level drawn, BMP, TSH.

## 2018-04-15 ENCOUNTER — Encounter (HOSPITAL_COMMUNITY): Payer: Self-pay | Admitting: Psychiatry

## 2018-04-15 ENCOUNTER — Other Ambulatory Visit (HOSPITAL_COMMUNITY): Payer: 59 | Attending: Psychiatry | Admitting: Psychiatry

## 2018-04-15 DIAGNOSIS — F1010 Alcohol abuse, uncomplicated: Secondary | ICD-10-CM | POA: Insufficient documentation

## 2018-04-15 DIAGNOSIS — R440 Auditory hallucinations: Secondary | ICD-10-CM | POA: Insufficient documentation

## 2018-04-15 DIAGNOSIS — Z79899 Other long term (current) drug therapy: Secondary | ICD-10-CM | POA: Insufficient documentation

## 2018-04-15 DIAGNOSIS — Z91040 Latex allergy status: Secondary | ICD-10-CM | POA: Insufficient documentation

## 2018-04-15 DIAGNOSIS — R4587 Impulsiveness: Secondary | ICD-10-CM | POA: Insufficient documentation

## 2018-04-15 DIAGNOSIS — R45851 Suicidal ideations: Secondary | ICD-10-CM | POA: Insufficient documentation

## 2018-04-15 DIAGNOSIS — Z833 Family history of diabetes mellitus: Secondary | ICD-10-CM | POA: Insufficient documentation

## 2018-04-15 DIAGNOSIS — Z818 Family history of other mental and behavioral disorders: Secondary | ICD-10-CM | POA: Insufficient documentation

## 2018-04-15 DIAGNOSIS — F3181 Bipolar II disorder: Secondary | ICD-10-CM | POA: Insufficient documentation

## 2018-04-15 DIAGNOSIS — Z811 Family history of alcohol abuse and dependence: Secondary | ICD-10-CM | POA: Insufficient documentation

## 2018-04-15 DIAGNOSIS — F419 Anxiety disorder, unspecified: Secondary | ICD-10-CM | POA: Insufficient documentation

## 2018-04-15 DIAGNOSIS — R441 Visual hallucinations: Secondary | ICD-10-CM | POA: Insufficient documentation

## 2018-04-15 LAB — BASIC METABOLIC PANEL
BUN/Creatinine Ratio: 12 (ref 9–23)
BUN: 8 mg/dL (ref 6–20)
CALCIUM: 9.2 mg/dL (ref 8.7–10.2)
CO2: 17 mmol/L — ABNORMAL LOW (ref 20–29)
Chloride: 104 mmol/L (ref 96–106)
Creatinine, Ser: 0.69 mg/dL (ref 0.57–1.00)
GFR calc Af Amer: 136 mL/min/{1.73_m2} (ref >59)
GFR calc non Af Amer: 118 mL/min/{1.73_m2} (ref >59)
Glucose: 102 mg/dL — ABNORMAL HIGH (ref 65–99)
Potassium: 4.2 mmol/L (ref 3.5–5.2)
Sodium: 139 mmol/L (ref 134–144)

## 2018-04-15 LAB — TSH: TSH: 2.23 u[IU]/mL (ref 0.450–4.500)

## 2018-04-15 LAB — LITHIUM LEVEL: Lithium Lvl: 0.2 mmol/L — ABNORMAL LOW (ref 0.6–1.2)
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
## 2018-04-16 ENCOUNTER — Other Ambulatory Visit (HOSPITAL_COMMUNITY): Payer: 59 | Admitting: Licensed Clinical Social Worker

## 2018-04-16 ENCOUNTER — Encounter (HOSPITAL_COMMUNITY): Payer: Self-pay | Admitting: Psychiatry

## 2018-04-16 DIAGNOSIS — Z811 Family history of alcohol abuse and dependence: Secondary | ICD-10-CM | POA: Diagnosis not present

## 2018-04-16 DIAGNOSIS — F3181 Bipolar II disorder: Secondary | ICD-10-CM | POA: Diagnosis not present

## 2018-04-16 DIAGNOSIS — F1010 Alcohol abuse, uncomplicated: Secondary | ICD-10-CM | POA: Insufficient documentation

## 2018-04-16 DIAGNOSIS — Z91040 Latex allergy status: Secondary | ICD-10-CM | POA: Diagnosis not present

## 2018-04-16 DIAGNOSIS — R4587 Impulsiveness: Secondary | ICD-10-CM | POA: Diagnosis not present

## 2018-04-16 DIAGNOSIS — F419 Anxiety disorder, unspecified: Secondary | ICD-10-CM | POA: Diagnosis not present

## 2018-04-16 DIAGNOSIS — Z833 Family history of diabetes mellitus: Secondary | ICD-10-CM | POA: Diagnosis not present

## 2018-04-16 DIAGNOSIS — R45851 Suicidal ideations: Secondary | ICD-10-CM | POA: Diagnosis not present

## 2018-04-16 DIAGNOSIS — R440 Auditory hallucinations: Secondary | ICD-10-CM | POA: Diagnosis not present

## 2018-04-16 DIAGNOSIS — Z79899 Other long term (current) drug therapy: Secondary | ICD-10-CM | POA: Diagnosis not present

## 2018-04-16 DIAGNOSIS — Z818 Family history of other mental and behavioral disorders: Secondary | ICD-10-CM | POA: Diagnosis not present

## 2018-04-16 DIAGNOSIS — R441 Visual hallucinations: Secondary | ICD-10-CM | POA: Diagnosis not present

## 2018-04-16 MED ORDER — LAMOTRIGINE 100 MG PO TABS: 100.0000 mg | ORAL_TABLET | Freq: Every day | ORAL | 0 refills | Status: DC

## 2018-04-16 NOTE — Progress Notes (Signed)
    Daily Group Progress Note  Program: IOP  Group Time: 9am-12pm  Participation Level: Minimal  Behavioral Response: Appropriate, Resistant and Minimizing  Type of Therapy: Group Therapy; psycho-educational group, process group ? Summary of Progress:  The purpose of this group is to utilize CBT and DBT skills in a group setting to increase use of healthy coping skills and decrease intensity and frequency of active mental health symptoms.  28M-10:30am Clinician checked in with clients, inquiring about completed self care activities from previous day. Clinician presented the skill of Emotional Freedom Technique, or Tapping to address symptoms of anxiety. Clinician and group members participated in guided tapping activity and discussed possible topics for tapping.  10:30am-12pm Clinician facilitated discussion on the effects of mental health on family roles and dynamics. Group members processed how they have adapted parenting and current family interactions based on how they were raised. Clinician provided time for group members to process how their family dynamics growing up helped or hindered addressing mental health concerns throughout life as well as boundaries needed to continue with healthy relationships. Clinician provided education and discussed with clients criteria for several mental health diagnosis as well as substance use disorders. Clinician and group members discussed the effects of substance use on mental health symptoms. Client did not participated in tapping activity, reporting she was thinking about too many other things. Client did meet with psychiatrist during group, see note for additional details. Client listened to psycho-educational information related to diagnosis however did not engage in conversations without prompting. Client self care is to use eating lunch with her mother and a dinner as social engagement to avoid isolation.  Harlon Ditty, LCSW
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
## 2018-04-17 ENCOUNTER — Other Ambulatory Visit (HOSPITAL_COMMUNITY): Payer: 59 | Admitting: Licensed Clinical Social Worker

## 2018-04-17 ENCOUNTER — Telehealth: Payer: Self-pay

## 2018-04-17 DIAGNOSIS — Z833 Family history of diabetes mellitus: Secondary | ICD-10-CM | POA: Diagnosis not present

## 2018-04-17 DIAGNOSIS — F3181 Bipolar II disorder: Secondary | ICD-10-CM

## 2018-04-17 DIAGNOSIS — Z811 Family history of alcohol abuse and dependence: Secondary | ICD-10-CM | POA: Diagnosis not present

## 2018-04-17 DIAGNOSIS — R4587 Impulsiveness: Secondary | ICD-10-CM | POA: Diagnosis not present

## 2018-04-17 DIAGNOSIS — R440 Auditory hallucinations: Secondary | ICD-10-CM | POA: Diagnosis not present

## 2018-04-17 DIAGNOSIS — F419 Anxiety disorder, unspecified: Secondary | ICD-10-CM | POA: Diagnosis not present

## 2018-04-17 DIAGNOSIS — R441 Visual hallucinations: Secondary | ICD-10-CM | POA: Diagnosis not present

## 2018-04-17 DIAGNOSIS — R45851 Suicidal ideations: Secondary | ICD-10-CM | POA: Diagnosis not present

## 2018-04-17 DIAGNOSIS — F1010 Alcohol abuse, uncomplicated: Secondary | ICD-10-CM | POA: Diagnosis not present

## 2018-04-17 NOTE — Telephone Encounter (Signed)
Pt acknowledged information.FMLA  Is for Return to work Tuesday 04/28/2018. Fax to [employer] @ [phone].

## 2018-04-17 NOTE — Telephone Encounter (Signed)
Let pt know that I never got the lab results.  Apparently they were put in under another provider's name and sent to them and not me.  When she gets her labs drawn at work in the future, the results need to be sent to me that is very important.Have her restart the lithium 300 mg, which she has already at home, take 1 nightly for 4 days, and then increase to 2 pills.  Make sure she takes it with food, with her evening meal or right before bed with a snack.What date will she be going back to work?  On Monday, we can send in an extension for the continuous FMLA.  Make sure she gives [place] the person's name and fax number that we need to send the FMLA extension information to.  Ask

## 2018-04-20 ENCOUNTER — Other Ambulatory Visit: Payer: Self-pay | Admitting: Physician Assistant

## 2018-04-20 ENCOUNTER — Encounter: Payer: Self-pay | Admitting: Physician Assistant

## 2018-04-20 ENCOUNTER — Telehealth: Payer: Self-pay | Admitting: Physician Assistant

## 2018-04-20 ENCOUNTER — Other Ambulatory Visit (HOSPITAL_COMMUNITY): Payer: 59 | Admitting: Licensed Clinical Social Worker

## 2018-04-20 DIAGNOSIS — R45851 Suicidal ideations: Secondary | ICD-10-CM | POA: Diagnosis not present

## 2018-04-20 DIAGNOSIS — F419 Anxiety disorder, unspecified: Secondary | ICD-10-CM | POA: Diagnosis not present

## 2018-04-20 DIAGNOSIS — Z79899 Other long term (current) drug therapy: Secondary | ICD-10-CM

## 2018-04-20 DIAGNOSIS — Z811 Family history of alcohol abuse and dependence: Secondary | ICD-10-CM | POA: Diagnosis not present

## 2018-04-20 DIAGNOSIS — R441 Visual hallucinations: Secondary | ICD-10-CM | POA: Diagnosis not present

## 2018-04-20 DIAGNOSIS — R4587 Impulsiveness: Secondary | ICD-10-CM | POA: Diagnosis not present

## 2018-04-20 DIAGNOSIS — F1010 Alcohol abuse, uncomplicated: Secondary | ICD-10-CM | POA: Diagnosis not present

## 2018-04-20 DIAGNOSIS — R440 Auditory hallucinations: Secondary | ICD-10-CM | POA: Diagnosis not present

## 2018-04-20 DIAGNOSIS — F3181 Bipolar II disorder: Secondary | ICD-10-CM | POA: Diagnosis not present

## 2018-04-20 DIAGNOSIS — Z833 Family history of diabetes mellitus: Secondary | ICD-10-CM | POA: Diagnosis not present

## 2018-04-20 NOTE — Telephone Encounter (Signed)
Left voicemail with information on pt's cell phone. Instructed to call back with any concerns/questions.

## 2018-04-20 NOTE — Progress Notes (Signed)
    Daily Group Progress Note  Program: IOP  Group Time: 9am-12pm  Participation Level: Active  Behavioral Response: Appropriate  Type of Therapy:  Group Therapy; psycho-educational group, process group  Summary of Progress: The purpose of this group is to utilize CBT and DBT skills in a group setting to increase use of healthy coping skills and decrease frequency and intensity of active mental health symptoms.  9am-10:30am Clinician presented psycho-educational information on the relationship between eating as a coping skill and uncomfortable emotions. Clinician and group members discussed emotions of hungry, angry/anxious, lonely, tired, bored, and scared, sad, or stress. Group members provided examples of times this has occurred and the consequences. Clinician and group members discussed healthy options for coping with uncomfortable emotions. Clinician provided group members with a handout related to mindful eating.  10:30am-12pm Clinician and group members processed Wheel of control. Clinician and group members processed feelings coming with lack of control at work and home. Clinician and group member discussed substance use in relation to control and perceived management of mental health symptoms. Clinician utilized socratic questioning to allow group discussion on using substances or impulsive behaviors to create a mask in order to avoid vulnerability. Client participated in all activities. Client admitted that her substance use could be seen as problematic. Client engaged in conversation about being around friends while drinking and fear related to the possibility they would not have the same relationship if she was sober.   Harlon Ditty, LCSW

## 2018-04-20 NOTE — Telephone Encounter (Signed)
They had increased it to 75mg  currently, she's doing a slow taper up. Recommend she stay at 100 mg till follow up back next week. Instructed to call back with questions and concerns.

## 2018-04-20 NOTE — Telephone Encounter (Signed)
Tara Sullivan, I'm ordering a Lithium level to be done either 2/20 or 2/21, through [facility] Let her know I've written a letter to her supervisor, and it should be faxed no later than tomorrow.

## 2018-04-20 NOTE — Telephone Encounter (Signed)
Pt in [place] [facility] outpatient program and the dr there told her to stop taking the lithium and increase lamictal up to 200mg  daily.

## 2018-04-20 NOTE — Progress Notes (Signed)
    Daily Group Progress Note  Program: IOP  Group Time: 9am-12pm  Participation Level: Active  Behavioral Response: Appropriate  Type of Therapy:  Group Therapy; psycho-educational group, process group  Summary of Progress:  The purpose of this group is to utilize CBT and DBT skills in a group setting to increase use of healthy coping skills and decrease intensity or frequency of active mental health symptoms.  9am-10am Clinician provided pscyho-education on Laughter Yoga. Clinician and clients participated with in-session activity utilizing laughter yoga in group setting  10am-12pm Process group focused on self compassion. Clinician and group members processed reasons behind self criticism. Clinician validated client feelings related to feeling supported or unsupported in expressing feelings and how this shapes future behaviors. Clinician utilized Socratic questioning to explore assumptions related to expressing feelings and vulnerability. Clinician and group members utilize positive affirmations and letter to self to focus on compassionate thoughts toward self. Clinician presented Personal Bill of Rights and facilitated discussion on getting needs met. Clinician inquired about self care activity planned for this evening. Client participated in activities and discussions. Client noted laughter yoga was 'weird' and not a skill she would be comfortable using or find helpful. Client was receptive to some prompted from clinician and group members with answering question, after initially stating she does not like to talk about herself. Client was able to demonstrate assertive communication during group. Client shared she previously expressed uncomfortable feelings with others and was vulnerable however it did not turn out well, making it a poor experience she would not want to repeat. Client stated what would be helpful is for someone to validate how she is feeling, not tell her how to feeling or not  feel.  Olegario Messier, LCSW

## 2018-04-20 NOTE — Progress Notes (Signed)
Ordered Lithium level.  See phone note.

## 2018-04-20 NOTE — Telephone Encounter (Signed)
Ok, stay off the Lithium and increase the Lamictal, that is a good idea.  But she was only on 50mg , right?  I don't want her to jump all the way to 200mg  right now.  Go to 100mg  qhs.

## 2018-04-20 NOTE — Telephone Encounter (Signed)
Ok

## 2018-04-20 NOTE — Telephone Encounter (Signed)
See previous phone message about lithium

## 2018-04-21 ENCOUNTER — Other Ambulatory Visit (HOSPITAL_COMMUNITY): Payer: 59 | Admitting: Licensed Clinical Social Worker

## 2018-04-21 DIAGNOSIS — F419 Anxiety disorder, unspecified: Secondary | ICD-10-CM | POA: Diagnosis not present

## 2018-04-21 DIAGNOSIS — Z811 Family history of alcohol abuse and dependence: Secondary | ICD-10-CM | POA: Diagnosis not present

## 2018-04-21 DIAGNOSIS — F1010 Alcohol abuse, uncomplicated: Secondary | ICD-10-CM | POA: Diagnosis not present

## 2018-04-21 DIAGNOSIS — F3181 Bipolar II disorder: Secondary | ICD-10-CM

## 2018-04-21 DIAGNOSIS — Z833 Family history of diabetes mellitus: Secondary | ICD-10-CM | POA: Diagnosis not present

## 2018-04-21 DIAGNOSIS — R4587 Impulsiveness: Secondary | ICD-10-CM | POA: Diagnosis not present

## 2018-04-21 DIAGNOSIS — R45851 Suicidal ideations: Secondary | ICD-10-CM | POA: Diagnosis not present

## 2018-04-21 DIAGNOSIS — R440 Auditory hallucinations: Secondary | ICD-10-CM | POA: Diagnosis not present

## 2018-04-21 DIAGNOSIS — R441 Visual hallucinations: Secondary | ICD-10-CM | POA: Diagnosis not present

## 2018-04-22 ENCOUNTER — Other Ambulatory Visit (HOSPITAL_COMMUNITY): Payer: 59 | Admitting: Licensed Clinical Social Worker

## 2018-04-22 DIAGNOSIS — R441 Visual hallucinations: Secondary | ICD-10-CM | POA: Diagnosis not present

## 2018-04-22 DIAGNOSIS — F1010 Alcohol abuse, uncomplicated: Secondary | ICD-10-CM | POA: Diagnosis not present

## 2018-04-22 DIAGNOSIS — R4587 Impulsiveness: Secondary | ICD-10-CM | POA: Diagnosis not present

## 2018-04-22 DIAGNOSIS — F3181 Bipolar II disorder: Secondary | ICD-10-CM

## 2018-04-22 DIAGNOSIS — Z833 Family history of diabetes mellitus: Secondary | ICD-10-CM | POA: Diagnosis not present

## 2018-04-22 DIAGNOSIS — R440 Auditory hallucinations: Secondary | ICD-10-CM | POA: Diagnosis not present

## 2018-04-22 DIAGNOSIS — F419 Anxiety disorder, unspecified: Secondary | ICD-10-CM | POA: Diagnosis not present

## 2018-04-22 DIAGNOSIS — Z811 Family history of alcohol abuse and dependence: Secondary | ICD-10-CM | POA: Diagnosis not present

## 2018-04-22 DIAGNOSIS — R45851 Suicidal ideations: Secondary | ICD-10-CM | POA: Diagnosis not present

## 2018-04-22 NOTE — Progress Notes (Signed)
    Daily Group Progress Note  Program: IOP  Group Time: 9am-12pm  Participation Level: Active  Behavioral Response: Appropriate  Type of Therapy:  Group Therapy; psycho-educational group, process group  Summary of Progress:  The purpose of this group is to utilize CBT and DBT skills in a group setting to increase use of healthy coping skills and decrease frequency and intensity of active mental health symptoms  9am-11am Clinician checked in with group members, assessing for SI/HI/psychosis and problematic symptoms. Clinician presented the topic of 'Letting Go of Emotional Suffering: Mindfulness of Your Current Emotion.' Clinician presented the DBT skill Ride the Wave related to distress tolerance. Clinician and group members discussed identifying and accepting feelings while limiting judgment and self criticism related to automatic thoughts and feelings. Clinician validated client feelings and utilized active listening skills including summarizing and reflective statements.Clinician provided clients with Sensory Coping Skills. Group members practiced in session and discussed modifications which can be used at work. Clinician provided options for focused breathing skills and practiced in session box breathing and other breathing exercises including utilizing breathing skills while walking to help distract from ruminating thoughts and feeling of anxiety in the body.  11am-12pm Process group co-facilitated by Chaplin with a focus on losses and how losses are grieved. Client shared feeling of loss of support from friends when she was depressed. Client noted she often helped others but felt others dismissed her or her feelings. Client reported increase in nightmares causing poor sleep and confusion about conflicting information about medication from different providers.  Harlon Ditty, LCSW

## 2018-04-23 ENCOUNTER — Other Ambulatory Visit (HOSPITAL_COMMUNITY): Payer: 59 | Admitting: Licensed Clinical Social Worker

## 2018-04-23 DIAGNOSIS — F3181 Bipolar II disorder: Secondary | ICD-10-CM | POA: Diagnosis not present

## 2018-04-23 DIAGNOSIS — Z811 Family history of alcohol abuse and dependence: Secondary | ICD-10-CM | POA: Diagnosis not present

## 2018-04-23 DIAGNOSIS — Z833 Family history of diabetes mellitus: Secondary | ICD-10-CM | POA: Diagnosis not present

## 2018-04-23 DIAGNOSIS — R441 Visual hallucinations: Secondary | ICD-10-CM | POA: Diagnosis not present

## 2018-04-23 DIAGNOSIS — R4587 Impulsiveness: Secondary | ICD-10-CM | POA: Diagnosis not present

## 2018-04-23 DIAGNOSIS — F1010 Alcohol abuse, uncomplicated: Secondary | ICD-10-CM | POA: Diagnosis not present

## 2018-04-23 DIAGNOSIS — F419 Anxiety disorder, unspecified: Secondary | ICD-10-CM | POA: Diagnosis not present

## 2018-04-23 DIAGNOSIS — R440 Auditory hallucinations: Secondary | ICD-10-CM | POA: Diagnosis not present

## 2018-04-23 DIAGNOSIS — R45851 Suicidal ideations: Secondary | ICD-10-CM | POA: Diagnosis not present

## 2018-04-24 ENCOUNTER — Other Ambulatory Visit (HOSPITAL_COMMUNITY): Payer: 59 | Admitting: Licensed Clinical Social Worker

## 2018-04-24 ENCOUNTER — Encounter (HOSPITAL_COMMUNITY): Payer: Self-pay | Admitting: Family

## 2018-04-24 DIAGNOSIS — F419 Anxiety disorder, unspecified: Secondary | ICD-10-CM | POA: Diagnosis not present

## 2018-04-24 DIAGNOSIS — Z811 Family history of alcohol abuse and dependence: Secondary | ICD-10-CM | POA: Diagnosis not present

## 2018-04-24 DIAGNOSIS — R45851 Suicidal ideations: Secondary | ICD-10-CM | POA: Diagnosis not present

## 2018-04-24 DIAGNOSIS — F1010 Alcohol abuse, uncomplicated: Secondary | ICD-10-CM | POA: Diagnosis not present

## 2018-04-24 DIAGNOSIS — F3181 Bipolar II disorder: Secondary | ICD-10-CM | POA: Diagnosis not present

## 2018-04-24 DIAGNOSIS — R441 Visual hallucinations: Secondary | ICD-10-CM | POA: Diagnosis not present

## 2018-04-24 DIAGNOSIS — R4587 Impulsiveness: Secondary | ICD-10-CM | POA: Diagnosis not present

## 2018-04-24 DIAGNOSIS — Z833 Family history of diabetes mellitus: Secondary | ICD-10-CM | POA: Diagnosis not present

## 2018-04-24 DIAGNOSIS — R440 Auditory hallucinations: Secondary | ICD-10-CM | POA: Diagnosis not present
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
## 2018-04-27 ENCOUNTER — Other Ambulatory Visit (HOSPITAL_COMMUNITY): Payer: 59 | Admitting: Licensed Clinical Social Worker

## 2018-04-27 DIAGNOSIS — F3181 Bipolar II disorder: Secondary | ICD-10-CM

## 2018-04-27 DIAGNOSIS — Z833 Family history of diabetes mellitus: Secondary | ICD-10-CM | POA: Diagnosis not present

## 2018-04-27 DIAGNOSIS — R45851 Suicidal ideations: Secondary | ICD-10-CM | POA: Diagnosis not present

## 2018-04-27 DIAGNOSIS — R441 Visual hallucinations: Secondary | ICD-10-CM | POA: Diagnosis not present

## 2018-04-27 DIAGNOSIS — F1010 Alcohol abuse, uncomplicated: Secondary | ICD-10-CM | POA: Diagnosis not present

## 2018-04-27 DIAGNOSIS — F419 Anxiety disorder, unspecified: Secondary | ICD-10-CM | POA: Diagnosis not present

## 2018-04-27 DIAGNOSIS — R440 Auditory hallucinations: Secondary | ICD-10-CM | POA: Diagnosis not present

## 2018-04-27 DIAGNOSIS — Z811 Family history of alcohol abuse and dependence: Secondary | ICD-10-CM | POA: Diagnosis not present

## 2018-04-27 DIAGNOSIS — R4587 Impulsiveness: Secondary | ICD-10-CM | POA: Diagnosis not present

## 2018-04-27 NOTE — Progress Notes (Signed)
    Daily Group Progress Note  Program: IOP  Group Time: 9am-12pm  Participation Level: Active  Behavioral Response: Appropriate  Type of Therapy:  Group Therapy; process group. Psycho-educational group  Summary of Progress:  Clinician and group members reviewed options to improve communication skills, including using I-statements, verbalizing thoughts, feelings and needs to others. Clinician and group members discussed having a goal of interactions prior to having conversations. Clinician presented Interpersonal Effectiveness skills DEARMAN, GIVE, and FAST. Clinician and group members processed how healthy or unhealthy communication has effected their relationships with others and self. Clinician facilitated discussion between group members related to maintaining hopefulness to address symptoms. Clients discussed difficulty with how mental health is viewed and addressed compared to physical illness. Clinician and group members discussed how to use assertive communication to get needs met when others do note see situations the same as client.  11am-12pm Yoga as mindfulness exercise facilitated by co-facilitator. Client actively listened to discussions and engaged when prompted. Client participated in mindfulness yoga actively. Client identified she is sometimes passive-aggressive with her communication style, which makes communication not as effective as it could be.  Olegario Messier, LCSW

## 2018-04-27 NOTE — Progress Notes (Signed)
    Daily Group Progress Note  Program: IOP  Group Time: 9am-12pm  Participation Level: Active  Behavioral Response: Appropriate  Type of Therapy:  Group Therapy; process group, psycho-educational group  Summary of Progress:  The purpose of this group is to utilize CBT and DBT skills in a group setting to increase use of healthy coping skills and decrease frequency and intensity of active mental health symptoms.  9am-10:30am Process group focused on purpose of uncomfortable emotions such as guilt. Clinician and group members processed the differences between helpful guilt, unhelpful guilt, and shame. Clinician actively listened to clients, using summarizing statements and validating feelings. Clinician challenged some cognitive distortions and praised clients for willingness to address challenges.  10:30am-12pm: Clinician presented Dewain Penning clip on guilt and shame. Group members discussed self-compassion to address self-criticism. Clinician presented the skill of Radical Acceptance. Group members discussed what radical acceptance is and is not, factors that interfere with acceptance, and why accepting uncomfortable or painful events is necessary.  Client reports a decrease in alcohol use however continues to use food and alcohol in response to feeling overly-emotional. Client verbalized concerns about returning to work and what co-workers will say or ask her. Client verbalizes continuing to struggle with catastrophizing and playing 'helpless' in order to avoid showing lack of confidence in a job she has had for more than 1 year. Client continues to struggle with nightmares which she reports is interfering with good sleep, making her not want to get up in the morning, putting strain on the relationship with her mother.  Harlon Ditty, LCSW
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
## 2018-04-27 NOTE — Patient Instructions (Signed)
D:  Patient completed MH-IOP today.  A:  Follow up with Melony Overly, PA on 04-30-18 @ 11:30 a.m and Mike Craze, [facility] (patient to schedule).  Encouraged support groups.  Recommend AA mtgs and obtaining a sponsor.  Return to work tomorrow; without any restrictions.  R:  Patient receptive.

## 2018-04-27 NOTE — Progress Notes (Signed)
    Daily Group Progress Note  Program: IOP  Group Time: 9am-12pm  Participation Level: Active  Behavioral Response: Appropriate  Type of Therapy:  Group Therapy; psycho-educational group, process group  Summary of Progress:  The purpose of this group is to utilize CBT, DBT, and Solution Focused Skills in a group setting to increase use of healthy coping skills and decrease frequency and intensity of active mental health symptoms.  9am-10:30am: Group members discussed the efficacy of their medications and shared some of their concerns with a pharmacist that in turn provided psychoeducation. Clinician checked in with group members; SI/HI/and psychosis was assessed as well as recent changes in their lives and their application of coping skills during those times. Clinician validated feelings of discomfort and empathetically confronted distorted thoughts and expectations of others.  10:30am-12pm: Clinician facilitated processing regarding perspectives of guilt and shame. Clinician used Motivational Interviewing to provide clients with questions to challenge negative thoughts. Client's negative self-talk was challenged. Clinician facilitated discussion regarding the role of rumination in their daily lives and how they can mitigate such thoughts to prevent symptoms of anxiety and depression. Clinician encouraged identification of thought traps and provided resources for adaptive coping methods.  Client shared her concerns with returning to work and listed some adaptive coping strategies she can use to mitigate some anticipated encounters. She discussed her maladaptive coping such as catastrophizing, rumination and the relationship it has with anxiety. Client was receptive to group and participated in disclosure at an adequate level of detail.    Harlon Ditty, LCSW

## 2018-04-27 NOTE — Progress Notes (Signed)
    Daily Group Progress Note  Program: IOP  Group Time: 9am-12pm  Participation Level: Active  Behavioral Response: Resistant  Type of Therapy:  Group Therapy; process group, psycho-educational group  Summary of Progress:  The purpose of this group is to utilize CBT and DBT skills in a group setting to increase use of healthy coping skills and decrease frequency and intensity of active mental health symptoms.  9am-11am Clinician checked in with group members, assessing for SI/HI/psychosis and overall level of functioning. Clinician utilized 'Loaded Question' icebreaker to discuss self-sabotaging behaviors. Clinician presented the topic of Communication Roadblocks. Clinician and group members processed several examples including assumptions, poor listening, overreacting, and not being clear. Clinician and group members processed examples in daily living where these have been a problem, especially when creating boundaries. Clinician and group members discussed options to improve communication skills, including using I-statements, verbalizing thoughts, feelings and needs to others. Clinician and group members role played scenarios in group to gain mastery over roadblocks.  11am-12pm Psycho-educational group co-facilitated by Wellness staff with a focus on improving stress management by addressing physical health. Group members set goals in areas of nutrition, sleep, and exercise. Client is minimally engaged in ice-breaker stating that she does not like talking about herself. Client is open to setting goals with wellness staff including walking the dog more often for movement.  Harlon Ditty, LCSW

## 2018-04-28 ENCOUNTER — Other Ambulatory Visit (HOSPITAL_COMMUNITY): Payer: 59

## 2018-04-29 ENCOUNTER — Other Ambulatory Visit (HOSPITAL_COMMUNITY): Payer: 59

## 2018-04-30 ENCOUNTER — Encounter: Payer: Self-pay | Admitting: Physician Assistant

## 2018-04-30 ENCOUNTER — Encounter

## 2018-04-30 ENCOUNTER — Ambulatory Visit: Payer: 59 | Admitting: Physician Assistant

## 2018-04-30 VITALS — BP 116/86 | HR 81

## 2018-04-30 DIAGNOSIS — F515 Nightmare disorder: Secondary | ICD-10-CM

## 2018-04-30 DIAGNOSIS — F1020 Alcohol dependence, uncomplicated: Secondary | ICD-10-CM | POA: Diagnosis not present

## 2018-04-30 DIAGNOSIS — F314 Bipolar disorder, current episode depressed, severe, without psychotic features: Secondary | ICD-10-CM

## 2018-04-30 DIAGNOSIS — F411 Generalized anxiety disorder: Secondary | ICD-10-CM

## 2018-04-30 MED ORDER — LAMOTRIGINE 100 MG PO TABS: ORAL_TABLET | ORAL | 1 refills | Status: DC

## 2018-04-30 MED ORDER — PRAZOSIN HCL 1 MG PO CAPS: ORAL_CAPSULE | ORAL | 1 refills | Status: DC

## 2018-04-30 MED FILL — PRAZOSIN 1 MG CAPSULE: 1 | 30 days supply | Qty: 60 | Fill #0

## 2018-04-30 MED FILL — lamoTRIgine 100 MG TABS: 100 | 30 days supply | Qty: 60 | Fill #0

## 2018-04-30 NOTE — Progress Notes (Signed)
[facility]  Patient ID: Tara Sullivan,  MRN: [id number]  PCP: Morrell Riddle, PA-C  Date of Evaluation: 04/30/18 Time spent:25 minutes  Chief Complaint:  Chief Complaint    Follow-up      HISTORY/CURRENT STATUS: HPI Here for routine f/u.  Finished up IOP recently.  States it was helpful and she wishes she could have done it for longer.  But she is feeling better as far as depression goes and more hopeful.  She is not having suicidal thoughts.  She is a little more able to enjoy things.  Energy and motivation are still somewhat low however.  It is a struggle to get up and go to work but she is able to do it.  A new problem has been nightmares.  They occur almost every night.  She feels like she is actually living in the dream and wakes up scared.  Went back to work 2 days ago. "It was horrible. It was like everyone liked me being gone. I cried b/c I felt treated so bad.  No one was nice at all.  Yesterday was a little bit easier than today has been easier.  I do have to go back to work after a leave here."  Anxiety is still a problem with generalized jitteriness inside at times.  She is not really having full-blown panic attacks.  She does use the Klonopin once or twice a day and it is helpful.  She is trying to use some of her coping techniques and is able to do that, the Klonopin every time she gets anxious.  We had stopped the lithium because it did not seem to be helping and her lithium level was low at the last visit.  The Lamictal dose was increased and she states she feels that it is making her a little bit better.  The psychiatrist at IOP recommended increasing the dose which we have done.  She is still drinking but much less than before.  Reports 0-2 drinks per week.  Denies muscle or joint pain, stiffness, or dystonia.  Denies dizziness, syncope, seizures, numbness, tingling, tremor, tics, unsteady gait, slurred speech, confusion.   Individual Medical  History/ Review of Systems: Changes? :No    Past medications for mental health diagnoses include: Zoloft, Seroquel, lithium, Latuda, Wellbutrin, BuSpar, Pristiq, Klonopin  Allergies: Latex  Current Medications:  Current Outpatient Medications:  .  clonazePAM (KLONOPIN) 1 MG tablet, Take 1 tablet (1 mg total) by mouth 2 (two) times daily as needed for anxiety., Disp: 45 tablet, Rfl: 1 .  etonogestrel-ethinyl estradiol (NUVARING) 0.12-0.015 MG/24HR vaginal ring, Insert vaginally and leave in place for 3 consecutive weeks, then remove for 1 week., Disp: 3 each, Rfl: 4 .  hydrOXYzine (VISTARIL) 25 MG capsule, TAKE 1 CAPSULE BY MOUTH EVERY 6 HOURS AS NEEDED, Disp: , Rfl:  .  lurasidone (LATUDA) 80 MG TABS tablet, Take 1 tablet (80 mg total) by mouth daily with supper. TAKE IN THE EVENING WITH FOOD, Disp: 30 tablet, Rfl: 2 .  lamoTRIgine (LAMICTAL) 100 MG tablet, Take 150 mg qd until 05/10/18 and then increase to 100mg  bid., Disp: 60 tablet, Rfl: 1 .  prazosin (MINIPRESS) 1 MG capsule, Take 1 qhs for 5 days.  If nightmares are still a problem, increase to 2 qhs., Disp: 60 capsule, Rfl: 1 Medication Side Effects: none  Family Medical/ Social History: Changes? No  MENTAL HEALTH EXAM:  Blood pressure 116/86, pulse 81.There is no height or weight on file to calculate  BMI.  General Appearance: Casual, Well Groomed and Obese  Eye Contact:  Good  Speech:  Clear and Coherent  Volume:  Normal  Mood:  Depressed  Affect:  Appropriate  Thought Process:  Goal Directed  Orientation:  Full (Time, Place, and Person)  Thought Content: Logical   Suicidal Thoughts:  No  Homicidal Thoughts:  No  Memory:  WNL  Judgement:  Good  Insight:  Good  Psychomotor Activity:  Normal  Concentration:  Concentration: Good and Attention Span: Good  Recall:  Good  Fund of Knowledge: Good  Language: Good  Assets:  Desire for Improvement  ADL's:  Intact  Cognition: WNL  Prognosis:  Good    DIAGNOSES:    ICD-10-CM    1. Bipolar disorder with severe depression (HCC) F31.4   2. Alcohol use disorder, moderate, dependence (HCC) F10.20   3. Generalized anxiety disorder F41.1   4. Nightmares F51.5     Receiving Psychotherapy: Yes    RECOMMENDATIONS: She is currently taking Lamictal 150 mg p.o. daily.  Stay on that dose until 05/10/2018 and then increase to 100 mg twice daily.  She verbalizes understanding. Continue Klonopin 1 mg 1/2-1 twice daily as needed. Continue Vistaril 25 mg 1 3 times daily as needed anxiety or sleep. Continue Latuda 80 mg 1 q. evening with meal. Start prazosin 1 mg p.o. nightly.  If she is still having nightmares in approximately 5 days increase to 2 p.o. nightly and watch her blood pressure.  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  She knows to let me know if dizziness occurs. Continue psychotherapy. Return in 4 weeks or sooner as needed.   Melony Overly, PA-C   This record has been created using [employer].  Chart creation errors have been sought, but may not always have been located and corrected. Such creation errors do not reflect on the standard of medical care.

## 2018-05-01 ENCOUNTER — Telehealth: Payer: Self-pay | Admitting: Physician Assistant

## 2018-05-01 ENCOUNTER — Other Ambulatory Visit (HOSPITAL_COMMUNITY): Payer: 59

## 2018-05-01 NOTE — Telephone Encounter (Signed)
Pt called asking for you to advise her of good therapist. She has not heard from her current therapist. Would like to change. [phone]

## 2018-05-01 NOTE — Telephone Encounter (Signed)
Recommend Thayer Ohm or Eunice Blase here.  First available.

## 2018-05-02 ENCOUNTER — Ambulatory Visit (INDEPENDENT_AMBULATORY_CARE_PROVIDER_SITE_OTHER): Payer: Self-pay | Admitting: Nurse Practitioner

## 2018-05-02 VITALS — BP 110/70 | HR 98 | Temp 99.3°F | Resp 14 | Wt 270.8 lb

## 2018-05-02 DIAGNOSIS — J069 Acute upper respiratory infection, unspecified: Secondary | ICD-10-CM

## 2018-05-02 DIAGNOSIS — R6889 Other general symptoms and signs: Secondary | ICD-10-CM

## 2018-05-02 LAB — POCT INFLUENZA A/B
Influenza A, POC: NEGATIVE
Influenza B, POC: NEGATIVE

## 2018-05-02 MED ORDER — PSEUDOEPH-BROMPHEN-DM 30-2-10 MG/5ML PO SYRP: 5.0000 mL | ORAL_SOLUTION | Freq: Four times a day (QID) | ORAL | 0 refills | Status: AC | PRN

## 2018-05-02 MED ORDER — FLUTICASONE PROPIONATE 50 MCG/ACT NA SUSP: 2.0000 | Freq: Every day | NASAL | 0 refills | Status: DC

## 2018-05-02 NOTE — Patient Instructions (Signed)
Viral Respiratory Infection -Take medication as prescribed. -Ibuprofen or Tylenol for pain, fever, or general discomfort. -Increase fluids. -Warm saltwater gargles 3-4 times daily until symptoms improve. -Sleep elevated on at least 2 pillows at bedtime to help with cough. -Use a humidifier or vaporizer when at home and during sleep to help with cough. -May use a teaspoon of honey or over-the-counter cough drops to help with cough. -Follow-up if symptoms do not improve.  A respiratory infection is an illness that affects part of the respiratory system, such as the lungs, nose, or throat. A respiratory infection that is caused by a virus is called a viral respiratory infection. Common types of viral respiratory infections include:  A cold.  The flu (influenza).  A respiratory syncytial virus (RSV) infection. What are the causes? This condition is caused by a virus. What are the signs or symptoms? Symptoms of this condition include:  A stuffy or runny nose.  Yellow or green nasal discharge.  A cough.  Sneezing.  Fatigue.  Achy muscles.  A sore throat.  Sweating or chills.  A fever.  A headache. How is this diagnosed? This condition may be diagnosed based on:  Your symptoms.  A physical exam.  Testing of nasal swabs. How is this treated? This condition may be treated with medicines, such as:  Antiviral medicine. This may shorten the length of time a person has symptoms.  Expectorants. These make it easier to cough up mucus.  Decongestant nasal sprays.  Acetaminophen or NSAIDs to relieve fever and pain. Antibiotic medicines are not prescribed for viral infections. This is because antibiotics are designed to kill bacteria. They are not effective against viruses. Follow these instructions at home:  Managing pain and congestion  Take over-the-counter and prescription medicines only as told by your health care provider.  If you have a sore throat, gargle with  a salt-water mixture 3-4 times a day or as needed. To make a salt-water mixture, completely dissolve -1 tsp of salt in 1 cup of warm water.  Use nose drops made from salt water to ease congestion and soften raw skin around your nose.  Drink enough fluid to keep your urine pale yellow. This helps prevent dehydration and helps loosen up mucus. General instructions  Rest as much as possible.  Do not drink alcohol.  Do not use any products that contain nicotine or tobacco, such as cigarettes and e-cigarettes. If you need help quitting, ask your health care provider.  Keep all follow-up visits as told by your health care provider. This is important. How is this prevented?   Get an annual flu shot. You may get the flu shot in late summer, fall, or winter. Ask your health care provider when you should get your flu shot.  Avoid exposing others to your respiratory infection. ? Stay home from work or school as told by your health care provider. ? Wash your hands with soap and water often, especially after you cough or sneeze. If soap and water are not available, use alcohol-based hand sanitizer.  Avoid contact with people who are sick during cold and flu season. This is generally fall and winter. Contact a health care provider if:  Your symptoms last for 10 days or longer.  Your symptoms get worse over time.  You have a fever.  You have severe sinus pain in your face or forehead.  The glands in your jaw or neck become very swollen. Get help right away if you:  Feel pain or pressure  in your chest.  Have shortness of breath.  Faint or feel like you will faint.  Have severe and persistent vomiting.  Feel confused or disoriented. Summary  A respiratory infection is an illness that affects part of the respiratory system, such as the lungs, nose, or throat. A respiratory infection that is caused by a virus is called a viral respiratory infection.  Common types of viral respiratory  infections are a cold, influenza, and respiratory syncytial virus (RSV) infection.  Symptoms of this condition include a stuffy or runny nose, cough, sneezing, fatigue, achy muscles, sore throat, and fevers or chills.  Antibiotic medicines are not prescribed for viral infections. This is because antibiotics are designed to kill bacteria. They are not effective against viruses. This information is not intended to replace advice given to you by your health care provider. Make sure you discuss any questions you have with your health care provider. Document Released: 11/28/2004 Document Revised: 03/31/2017 Document Reviewed: 03/31/2017 Elsevier Interactive Patient Education  2019 [employer].

## 2018-05-02 NOTE — Progress Notes (Signed)
MRN: [id number] DOB: 1991/01/25  Subjective:   Tara Sullivan is a 28 y.o. female presenting for chief complaint of cough and sore throat (x1 day) .  Reports 1 day history of sore throat and dry cough. Has tried nothing for relief. Denies fever, sinus pain, rhinorrhea, itchy watery eyes, red eyes, ear fullness, ear drainage, difficulty swallowing, pain with swallowing, inability to swallow, productive cough, shortness of breath, chest tightness and chest pain, fatigue, malaise, decreased appetite, nausea, vomiting, abdominal pain and diarrhea. 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 Denies history of seasonal allergies, history of asthma. Patient has had a flu shot this season. Denies smoking. Denies any other aggravating or relieving factors, no other questions or concerns. Patient is requesting an influenza test.  Review of Systems  Constitutional: Negative.   HENT: Positive for sore throat.   Eyes: Negative.   Respiratory: Positive for cough. Negative for sputum production and shortness of breath.   Cardiovascular: Negative.   Gastrointestinal: Negative.   Skin: Negative.     Tara Sullivan has a current medication list which includes the following prescription(s): etonogestrel-ethinyl estradiol, lamotrigine, lurasidone, prazosin, clonazepam, and hydroxyzine. Also is allergic to latex.  Tara Sullivan  has a past medical history of Anxiety, Bipolar disorder (HCC), Depression, and Irregular menses. Also  has a past surgical history that includes Knee surgery (01/2009).   Objective:   Vitals: BP 110/70   Pulse 98   Temp 99.3 F (37.4 C)   Resp 14   Wt 270 lb 12.8 oz (122.8 kg)   SpO2 98%   BMI 42.41 kg/m   Physical Exam Vitals signs reviewed.  Constitutional:      General: She is not in acute distress. HENT:     Head: Normocephalic.     Right Ear: Tympanic membrane, ear canal and external ear normal.     Left Ear: Tympanic membrane, ear canal and external ear normal.     Nose: Mucosal edema, congestion (mild) and rhinorrhea (clear drainage) present.     Right Turbinates: Enlarged and swollen.     Left Turbinates: Enlarged and swollen.     Right Sinus: No maxillary sinus tenderness or frontal sinus tenderness.     Left Sinus: No maxillary sinus tenderness or frontal sinus tenderness.     Mouth/Throat:     Lips: Pink.     Mouth: Mucous membranes are moist.     Pharynx: Uvula midline. Posterior oropharyngeal erythema present. No pharyngeal swelling, oropharyngeal exudate or uvula swelling.     Tonsils: Swelling: 0 on the right. 0 on the left.  Eyes:     Pupils: Pupils are equal, round, and reactive to light.  Neck:     Musculoskeletal: Normal range of motion and neck supple.  Cardiovascular:     Rate and Rhythm: Normal rate and regular rhythm.     Pulses: Normal pulses.     Heart sounds: Normal heart sounds.  Pulmonary:     Effort: Pulmonary effort is normal. No respiratory distress.     Breath sounds: Normal breath sounds. No stridor. No wheezing, rhonchi or rales.  Abdominal:     General: Bowel sounds are normal.     Palpations: Abdomen is soft.     Tenderness: There is no abdominal tenderness.  Lymphadenopathy:     Cervical: No cervical adenopathy.  Skin:    General: Skin is warm and dry.     Capillary Refill: Capillary refill takes less than 2 seconds.  Neurological:  General: No focal deficit present.     Mental Status: She is alert and oriented to person, place, and time.     Cranial Nerves: No cranial nerve deficit.  Psychiatric:        Mood and Affect: Mood normal.        Thought Content: Thought content normal.    Results for orders placed or performed in visit on 05/02/18 (from the past 24 hour(s))  POCT Influenza A/B     Status: None   Collection Time: 05/02/18  4:11 PM  Result Value Ref Range   Influenza A, POC Negative Negative   Influenza B, POC Negative Negative   Centor Score Tonsillar Exudate: 0 Fever/Hx of fever >39  Degrees Celsius: 0 Tender anterior cervical lymphadenopathy: 0 Age 28- 44: 0 Total: 0  Assessment and Plan :  Exam findings, diagnosis etiology and medication use and indications reviewed with patient. Follow- Up and discharge instructions provided. No emergent/urgent issues found on exam.  Based on the patient's clinical presentation, symptoms, and physical exam, patient's findings are consistent with that of viral etiology.  Patient does not display fever, body aches, productive cough, or other upper respiratory symptoms.  Patient's influenza test was negative, which was performed upon her request.  Patient will prefer righted symptomatic treatment to include Bromfed for her cough and fluticasone for her postnasal drainage and mild congestion.  She will perform other symptomatic treatment to include antipyretics, warm salt water gargles, increasing fluids, and rest.  The patient is well-appearing, is in no acute distress, and vitals are stable at this time.  Patient education was provided. Patient verbalized understanding of information provided and agrees with plan of care (POC), all questions answered. The patient is advised to call or return to clinic if condition does not see an improvement in symptoms, or to seek the care of the closest emergency department if condition worsens with the above plan.   1. Flu-like symptoms  - POCT Influenza A/B  2. Viral upper respiratory infection  - brompheniramine-pseudoephedrine-DM 30-2-10 MG/5ML syrup; Take 5 mLs by mouth 4 (four) times daily as needed for up to 7 days.  Dispense: 150 mL; Refill: 0 - fluticasone (FLONASE) 50 MCG/ACT nasal spray; Place 2 sprays into both nostrils daily for 10 days.  Dispense: 16 g; Refill: 0 -Take medication as prescribed. -Ibuprofen or Tylenol for pain, fever, or general discomfort. -Increase fluids. -Warm saltwater gargles 3-4 times daily until symptoms improve. -Sleep elevated on at least 2 pillows at bedtime to help  with cough. -Use a humidifier or vaporizer when at home and during sleep to help with cough. -May use a teaspoon of honey or over-the-counter cough drops to help with cough. -Follow-up if symptoms do not improve.

## 2018-05-04 ENCOUNTER — Other Ambulatory Visit (HOSPITAL_COMMUNITY): Payer: 59

## 2018-05-04 ENCOUNTER — Telehealth: Payer: Self-pay

## 2018-05-04 NOTE — Telephone Encounter (Signed)
Patient did not answered the phone, I left a message asking to call us back.  

## 2018-05-05 ENCOUNTER — Other Ambulatory Visit (HOSPITAL_COMMUNITY): Payer: 59

## 2018-05-05 MED FILL — LATUDA 80 MG TABLET: 80 | 30 days supply | Qty: 30 | Fill #2

## 2018-05-06 ENCOUNTER — Other Ambulatory Visit (HOSPITAL_COMMUNITY): Payer: 59

## 2018-05-07 ENCOUNTER — Other Ambulatory Visit (HOSPITAL_COMMUNITY): Payer: 59

## 2018-05-08 ENCOUNTER — Other Ambulatory Visit (HOSPITAL_COMMUNITY): Payer: 59

## 2018-05-11 ENCOUNTER — Other Ambulatory Visit (HOSPITAL_COMMUNITY): Payer: 59

## 2018-05-11 ENCOUNTER — Telehealth: Payer: Self-pay | Admitting: Physician Assistant

## 2018-05-11 NOTE — Telephone Encounter (Signed)
She's on Klonopin and can't take a stimulant.  Several of her meds can cause drowsiness, Latuda, prazosin, hydroxyzine, Klonopin.  If she can avoid the Klonopin and hydroxyzine, that will help prevent the drowsiness.

## 2018-05-11 NOTE — Telephone Encounter (Signed)
She spoke with you about this and the buspar was discontinued but it did not help at all.  Please advise

## 2018-05-11 NOTE — Telephone Encounter (Signed)
Pt called. Having problem stating a wake in the morning. Ask if Adderall may help.  Pharm Med [facility].

## 2018-05-12 ENCOUNTER — Other Ambulatory Visit (HOSPITAL_COMMUNITY): Payer: 59

## 2018-05-13 ENCOUNTER — Other Ambulatory Visit (HOSPITAL_COMMUNITY): Payer: 59

## 2018-05-13 NOTE — Telephone Encounter (Signed)
Pt states she's not taking clonazepam or hydroxyzine much. Instructed her to discuss options at next office visit in 2 weeks with provider. Verbalized understanding.

## 2018-05-14 ENCOUNTER — Other Ambulatory Visit (HOSPITAL_COMMUNITY): Payer: 59

## 2018-05-14 DIAGNOSIS — F332 Major depressive disorder, recurrent severe without psychotic features: Secondary | ICD-10-CM | POA: Diagnosis not present

## 2018-05-15 ENCOUNTER — Other Ambulatory Visit (HOSPITAL_COMMUNITY): Payer: 59

## 2018-05-18 ENCOUNTER — Other Ambulatory Visit (HOSPITAL_COMMUNITY): Payer: 59

## 2018-05-19 ENCOUNTER — Other Ambulatory Visit (HOSPITAL_COMMUNITY): Payer: 59

## 2018-05-19 MED FILL — clonazePAM 1 MG TABS: 1 | 23 days supply | Qty: 45 | Fill #1

## 2018-05-20 ENCOUNTER — Other Ambulatory Visit (HOSPITAL_COMMUNITY): Payer: 59

## 2018-05-21 ENCOUNTER — Other Ambulatory Visit (HOSPITAL_COMMUNITY): Payer: 59

## 2018-05-21 DIAGNOSIS — F332 Major depressive disorder, recurrent severe without psychotic features: Secondary | ICD-10-CM | POA: Diagnosis not present

## 2018-05-22 ENCOUNTER — Other Ambulatory Visit (HOSPITAL_COMMUNITY): Payer: 59

## 2018-05-25 ENCOUNTER — Other Ambulatory Visit (HOSPITAL_COMMUNITY): Payer: 59

## 2018-05-25 ENCOUNTER — Other Ambulatory Visit: Payer: Self-pay | Admitting: Nurse Practitioner

## 2018-05-25 ENCOUNTER — Ambulatory Visit: Payer: 59 | Admitting: Mental Health

## 2018-05-25 DIAGNOSIS — J069 Acute upper respiratory infection, unspecified: Secondary | ICD-10-CM

## 2018-05-26 ENCOUNTER — Other Ambulatory Visit (HOSPITAL_COMMUNITY): Payer: 59

## 2018-05-26 ENCOUNTER — Other Ambulatory Visit: Payer: Self-pay | Admitting: Women's Health

## 2018-05-26 DIAGNOSIS — Z3044 Encounter for surveillance of vaginal ring hormonal contraceptive device: Secondary | ICD-10-CM

## 2018-05-27 ENCOUNTER — Other Ambulatory Visit (HOSPITAL_COMMUNITY): Payer: 59

## 2018-05-28 ENCOUNTER — Ambulatory Visit (INDEPENDENT_AMBULATORY_CARE_PROVIDER_SITE_OTHER): Payer: 59 | Admitting: Physician Assistant

## 2018-05-28 ENCOUNTER — Encounter: Payer: Self-pay | Admitting: Physician Assistant

## 2018-05-28 ENCOUNTER — Other Ambulatory Visit (HOSPITAL_COMMUNITY): Payer: 59

## 2018-05-28 ENCOUNTER — Other Ambulatory Visit: Payer: Self-pay

## 2018-05-28 DIAGNOSIS — F411 Generalized anxiety disorder: Secondary | ICD-10-CM | POA: Diagnosis not present

## 2018-05-28 DIAGNOSIS — F3162 Bipolar disorder, current episode mixed, moderate: Secondary | ICD-10-CM | POA: Diagnosis not present

## 2018-05-28 MED ORDER — LAMOTRIGINE 100 MG PO TABS: 100.0000 mg | ORAL_TABLET | Freq: Two times a day (BID) | ORAL | 2 refills | Status: DC

## 2018-05-28 MED ORDER — OXCARBAZEPINE 300 MG PO TABS: ORAL_TABLET | ORAL | 1 refills | Status: DC

## 2018-05-28 MED FILL — LAMOTRIGINE 100 MG TABS: 100 | 30 days supply | Qty: 60 | Fill #0

## 2018-05-28 MED FILL — OXcarbazepine 300 MG TABS: 300 | 32 days supply | Qty: 60 | Fill #0

## 2018-05-28 NOTE — Progress Notes (Signed)
[facility]  Patient ID: Tara Sullivan,  MRN: [id number]  PCP: Morrell Riddle, PA-C  Date of Evaluation: 05/28/2018 Time spent:15 minutes  Chief Complaint:  Chief Complaint    Follow-up     Virtual Visit via Telephone Note  I connected with Tara Sullivan on 05/28/18 at  2:30 PM EDT by telephone and verified that I am speaking with the correct person using two identifiers.   I discussed the limitations, risks, security and privacy concerns of performing an evaluation and management service by telephone and the availability of in person appointments. I also discussed with the patient that there may be a patient responsible charge related to this service. The patient expressed understanding and agreed to proceed.    HISTORY/CURRENT STATUS: HPI  For routine med check.  Patient states in some ways she is feeling quite a bit better.  She does get more irritable and angry at times.  That has gotten worse since she went off the lithium.  She does still have her ups and downs with some mild depression but not like it was.  Increase in the Lamictal has been a good move.  She is working now without any difficulty.  Her energy and motivation are better than they were.  She is not isolating.  She does get tired easily but is able to do the things that she needs to get done throughout the day.  Patient denies loss of interest in usual activities and is able to enjoy things.  Denies decreased energy or motivation.  Appetite has not changed.  No extreme sadness, tearfulness, or feelings of hopelessness.  Denies any changes in concentration, making decisions or remembering things.  Denies suicidal or homicidal thoughts.  Patient denies increased energy with decreased need for sleep, no increased talkativeness, no racing thoughts, no impulsivity or risky behaviors, no increased spending, no increased libido, no grandiosity.  Anxiety is slightly better controlled.  Not needing the  Klonopin all the time.  Reports no alcohol since the end of February.  She is also had no further nightmares.  She did not take the prazosin except for 1 night.  Denies muscle or joint pain, stiffness, or dystonia.  Denies dizziness, syncope, seizures, numbness, tingling, tremor, tics, unsteady gait, slurred speech, confusion.   Individual Medical History/ Review of Systems: Changes? :No    Past medications for mental health diagnoses include: Zoloft, Seroquel, lithium, Latuda, Wellbutrin, BuSpar, Pristiq, Klonopin, Prazosin x 1 night, then no longer needed.  Allergies: Latex  Current Medications:  Current Outpatient Medications:  .  clonazePAM (KLONOPIN) 1 MG tablet, Take 1 tablet (1 mg total) by mouth 2 (two) times daily as needed for anxiety., Disp: 45 tablet, Rfl: 1 .  etonogestrel-ethinyl estradiol (NUVARING) 0.12-0.015 MG/24HR vaginal ring, Insert vaginally and leave in place for 3 consecutive weeks, then remove for 1 week., Disp: 3 each, Rfl: 4 .  lamoTRIgine (LAMICTAL) 100 MG tablet, Take 1 tablet (100 mg total) by mouth 2 (two) times daily., Disp: 60 tablet, Rfl: 2 .  lurasidone (LATUDA) 80 MG TABS tablet, Take 1 tablet (80 mg total) by mouth daily with supper. TAKE IN THE EVENING WITH FOOD, Disp: 30 tablet, Rfl: 2 .  fluticasone (FLONASE) 50 MCG/ACT nasal spray, Place 2 sprays into both nostrils daily for 10 days., Disp: 16 g, Rfl: 0 .  Oxcarbazepine (TRILEPTAL) 300 MG tablet, 1 po q hs for 4 nights, then 1 bid., Disp: 60 tablet, Rfl: 1 Medication Side Effects: none  Family Medical/  Social History: Changes? No  MENTAL HEALTH EXAM:  There were no vitals taken for this visit.There is no height or weight on file to calculate BMI.  General Appearance: Phone visit therefore unable to assess  Eye Contact:  Unable to assess  Speech:  Clear and Coherent  Volume:  Normal  Mood:  Euthymic  Affect:  Unable to assess  Thought Process:  Goal Directed  Orientation:  Full (Time, Place,  and Person)  Thought Content: Logical   Suicidal Thoughts:  No  Homicidal Thoughts:  No  Memory:  WNL  Judgement:  Good  Insight:  Good  Psychomotor Activity:  Normal  Concentration:  Concentration: Good  Recall:  Good  Fund of Knowledge: Good  Language: Good  Assets:  Desire for Improvement  ADL's:  Intact  Cognition: WNL  Prognosis:  Good    DIAGNOSES:    ICD-10-CM   1. Bipolar 1 disorder, mixed, moderate (HCC) F31.62   2. Generalized anxiety disorder F41.1     Receiving Psychotherapy: No    RECOMMENDATIONS: We discussed different options for the irritability.  One option would be to add in Trintellix.  Another would be to add in low-dose lithium since that did seem to help some.  We agreed to try the Trileptal. Start Trileptal 300 mg p.o. nightly for 4 days and then 1 twice daily. Continue Lamictal 100 mg twice daily. Continue Latuda 80 mg q. evening with food.  (Higher dose caused skin changes) Continue Klonopin 1 mg twice daily as needed (her mom takes care of this medication). Return in 4 to 6 weeks.   Melony Overly, PA-C   This record has been created using [employer].  Chart creation errors have been sought, but may not always have been located and corrected. Such creation errors do not reflect on the standard of medical care.

## 2018-05-29 ENCOUNTER — Other Ambulatory Visit (HOSPITAL_COMMUNITY): Payer: 59

## 2018-05-29 DIAGNOSIS — F332 Major depressive disorder, recurrent severe without psychotic features: Secondary | ICD-10-CM | POA: Diagnosis not present

## 2018-06-01 ENCOUNTER — Ambulatory Visit (INDEPENDENT_AMBULATORY_CARE_PROVIDER_SITE_OTHER): Payer: 59 | Admitting: Women's Health

## 2018-06-01 ENCOUNTER — Other Ambulatory Visit: Payer: Self-pay

## 2018-06-01 ENCOUNTER — Other Ambulatory Visit (HOSPITAL_COMMUNITY): Payer: 59

## 2018-06-01 ENCOUNTER — Encounter: Payer: Self-pay | Admitting: Women's Health

## 2018-06-01 VITALS — BP 116/74 | Ht 68.0 in | Wt 259.0 lb

## 2018-06-01 DIAGNOSIS — Z113 Encounter for screening for infections with a predominantly sexual mode of transmission: Secondary | ICD-10-CM

## 2018-06-01 DIAGNOSIS — Z01419 Encounter for gynecological examination (general) (routine) without abnormal findings: Secondary | ICD-10-CM | POA: Diagnosis not present

## 2018-06-01 DIAGNOSIS — Z3044 Encounter for surveillance of vaginal ring hormonal contraceptive device: Secondary | ICD-10-CM

## 2018-06-01 DIAGNOSIS — R87618 Other abnormal cytological findings on specimens from cervix uteri: Secondary | ICD-10-CM | POA: Diagnosis not present

## 2018-06-01 DIAGNOSIS — Z13220 Encounter for screening for lipoid disorders: Secondary | ICD-10-CM

## 2018-06-01 MED ORDER — ETONOGESTREL-ETHINYL ESTRADIOL 0.12-0.015 MG/24HR VA RING: VAGINAL_RING | VAGINAL | 4 refills | Status: DC

## 2018-06-01 MED FILL — ETONOGESTREL-ETHINYL ESTRAD: 0.12-0.015 | 84 days supply | Qty: 3 | Fill #0

## 2018-06-01 NOTE — Progress Notes (Signed)
Tara Sullivan Nov 23, 1990 [id number]    History:    Presents for annual exam.  NuvaRing continuously amenorrhea.  Gardasil series completed.  Normal Pap history.  Long history of anxiety/depression/bipolar with drug and alcohol abuse in the past.  Past medical history, past surgical history, family history and social history were all reviewed and documented in the EPIC chart.  CMA at [employer].  Sister and brother bipolar, mother healthy.  ROS:  A ROS was performed and pertinent positives and negatives are included.  Exam:  Vitals:   06/01/18 1016  BP: 116/74  Weight: 259 lb (117.5 kg)  Height: 5\' 8"  (1.727 m)   Body mass index is 39.38 kg/m.   General appearance:  Normal Thyroid:  Symmetrical, normal in size, without palpable masses or nodularity. Respiratory  Auscultation:  Clear without wheezing or rhonchi Cardiovascular  Auscultation:  Regular rate, without rubs, murmurs or gallops  Edema/varicosities:  Not grossly evident Abdominal  Soft,nontender, without masses, guarding or rebound.  Liver/spleen:  No organomegaly noted  Hernia:  None appreciated  Skin  Inspection:  Grossly normal   Breasts: Examined lying and sitting.     Right: Without masses, retractions, discharge or axillary adenopathy.     Left: Without masses, retractions, discharge or axillary adenopathy. Gentitourinary   Inguinal/mons:  Normal without inguinal adenopathy  External genitalia:  Normal  BUS/Urethra/Skene's glands:  Normal  Vagina:  Normal  Cervix:  Normal  Uterus:  normal in size, shape and contour.  Midline and mobile  Adnexa/parametria:     Rt: Without masses or tenderness.   Lt: Without masses or tenderness.  Anus and perineum: Normal  Digital rectal exam: Normal sphincter tone without palpated masses or tenderness  Assessment/Plan:  28 y.o. S [place] G0 for annual exam with no complaints.    Amenorrheic NuvaRing continuously Morbid obesity Bipolar-psychiatrist/counseling manage  meds/labs STD screen  Plan: NuvaRing prescription, proper use, slight risk for blood clots and strokes reviewed.  Condoms encouraged until permanent partner.  Reviewed importance of increasing regular cardio type exercise, decreasing calorie/carbs and MVI daily encouraged.  Urged to continue counseling, dating safety reviewed.  Lipid panel, GC/chlamydia, HIV, RPR.    Harrington Challenger [facility], 10:33 AM 06/01/2018

## 2018-06-01 NOTE — Patient Instructions (Signed)

## 2018-06-02 ENCOUNTER — Other Ambulatory Visit (HOSPITAL_COMMUNITY): Payer: 59

## 2018-06-02 LAB — LIPID PANEL
Cholesterol: 146 mg/dL (ref <200)
HDL: 66 mg/dL (ref >=50)
LDL Cholesterol (Calc): 51 mg/dL (calc)
Non-HDL Cholesterol (Calc): 80 mg/dL (calc) (ref <130)
Total CHOL/HDL Ratio: 2.2 (calc) (ref <5.0)
Triglycerides: 230 mg/dL — ABNORMAL HIGH (ref <150)

## 2018-06-02 LAB — PAP IG W/ RFLX HPV ASCU

## 2018-06-02 LAB — C. TRACHOMATIS/N. GONORRHOEAE RNA
C. trachomatis RNA, TMA: NOT DETECTED
N. gonorrhoeae RNA, TMA: NOT DETECTED

## 2018-06-02 LAB — HIV ANTIBODY (ROUTINE TESTING W REFLEX): HIV 1&2 Ab, 4th Generation: NONREACTIVE

## 2018-06-02 LAB — RPR: RPR Ser Ql: NONREACTIVE

## 2018-06-03 ENCOUNTER — Other Ambulatory Visit (HOSPITAL_COMMUNITY): Payer: 59

## 2018-06-04 ENCOUNTER — Other Ambulatory Visit (HOSPITAL_COMMUNITY): Payer: 59

## 2018-06-04 MED FILL — LATUDA 80 MG TABLET: 80 | 30 days supply | Qty: 30 | Fill #1

## 2018-06-05 ENCOUNTER — Other Ambulatory Visit (HOSPITAL_COMMUNITY): Payer: 59

## 2018-06-08 ENCOUNTER — Other Ambulatory Visit (HOSPITAL_COMMUNITY): Payer: 59

## 2018-06-09 ENCOUNTER — Other Ambulatory Visit (HOSPITAL_COMMUNITY): Payer: 59

## 2018-06-10 DIAGNOSIS — F332 Major depressive disorder, recurrent severe without psychotic features: Secondary | ICD-10-CM | POA: Diagnosis not present

## 2018-06-16 NOTE — Telephone Encounter (Signed)
My chart message came back unread, patient informed.  

## 2018-06-17 ENCOUNTER — Ambulatory Visit (INDEPENDENT_AMBULATORY_CARE_PROVIDER_SITE_OTHER): Payer: 59 | Admitting: Family Medicine

## 2018-06-17 ENCOUNTER — Encounter: Payer: Self-pay | Admitting: Family Medicine

## 2018-06-17 ENCOUNTER — Other Ambulatory Visit: Payer: Self-pay

## 2018-06-17 VITALS — BP 120/82 | HR 97 | Temp 98.4°F | Resp 16 | Ht 68.0 in | Wt 259.0 lb

## 2018-06-17 DIAGNOSIS — R300 Dysuria: Secondary | ICD-10-CM

## 2018-06-17 DIAGNOSIS — R319 Hematuria, unspecified: Secondary | ICD-10-CM | POA: Diagnosis not present

## 2018-06-17 DIAGNOSIS — N390 Urinary tract infection, site not specified: Secondary | ICD-10-CM

## 2018-06-17 LAB — POCT URINALYSIS DIP (MANUAL ENTRY)
Bilirubin, UA: NEGATIVE
Glucose, UA: NEGATIVE mg/dL
Ketones, POC UA: NEGATIVE mg/dL
Nitrite, UA: NEGATIVE
Protein Ur, POC: 30 mg/dL — AB
Spec Grav, UA: 1.025 (ref 1.010–1.025)
Urobilinogen, UA: 0.2 E.U./dL
pH, UA: 5.5 (ref 5.0–8.0)

## 2018-06-17 LAB — POC MICROSCOPIC URINALYSIS (UMFC): Mucus: ABSENT

## 2018-06-17 MED ORDER — NITROFURANTOIN MONOHYD MACRO 100 MG PO CAPS: 100.0000 mg | ORAL_CAPSULE | Freq: Two times a day (BID) | ORAL | 0 refills | Status: DC

## 2018-06-17 MED ORDER — PHENAZOPYRIDINE HCL 100 MG PO TABS: 100.0000 mg | ORAL_TABLET | Freq: Three times a day (TID) | ORAL | 0 refills | Status: DC | PRN

## 2018-06-17 NOTE — Progress Notes (Signed)
Subjective:    Patient ID: Tara Sullivan, female    DOB: 02/16/1991, 28 y.o.   MRN: [id number]  HPI Tara Sullivan is a 28 y.o. female Presents today for: Chief Complaint  Patient presents with   Hematuria    with some pain x 4 days     Started 3 days ago with dysuria, followed by frequency and urgency.  Noticed some blood in urine today.  Thinks it started after having intercourse on Sunday, did not want to urinate afterwards.  Dysuria started the following day.  No fevers, nausea, vomiting, down pain or back pain.  Has not tried any over-the-counter treatments.  Usually does drink hemorrhages but has not been drinking as much of that recently.   On nuvaring for contraception.   Patient Active Problem List   Diagnosis Date Noted   Bipolar 2 disorder, major depressive episode (HCC) 04/16/2018   Alcohol use disorder, mild, abuse 04/16/2018   Bipolar disorder, current episode mixed, moderate (HCC) 11/11/2017   Obesity, Class II, BMI 35-39.9 01/01/2017   History of drug overdose 01/09/2014   Generalized anxiety disorder 01/09/2014   Depression 01/09/2014   History of chlamydia 11/13/2011   Irregular menses    Past Medical History:  Diagnosis Date   Anxiety    Bipolar disorder (HCC)    Depression    Irregular menses    Past Surgical History:  Procedure Laterality Date   KNEE SURGERY  01/2009   RIGHT KNEE   Allergies  Allergen Reactions   Latex     Swelling and burning of skin   Prior to Admission medications   Medication Sig Start Date End Date Taking? Authorizing Provider  clonazePAM (KLONOPIN) 1 MG tablet Take 1 tablet (1 mg total) by mouth 2 (two) times daily as needed for anxiety. 02/23/18   Cherie Ouch, PA-C  etonogestrel-ethinyl estradiol (NUVARING) 0.12-0.015 MG/24HR vaginal ring Insert vaginally and leave in place for 3 consecutive weeks, then remove for 1 week. 06/01/18   Harrington Challenger, NP  fluticasone (FLONASE) 50 MCG/ACT nasal  spray Place 2 sprays into both nostrils daily for 10 days. 05/02/18 05/12/18  Benay Pike, NP  lamoTRIgine (LAMICTAL) 100 MG tablet Take 1 tablet (100 mg total) by mouth 2 (two) times daily. 05/28/18   Melony Overly T, PA-C  lurasidone (LATUDA) 80 MG TABS tablet Take 1 tablet (80 mg total) by mouth daily with supper. TAKE IN THE EVENING WITH FOOD 02/23/18   Melony Overly T, PA-C  Oxcarbazepine (TRILEPTAL) 300 MG tablet 1 po q hs for 4 nights, then 1 bid. 05/28/18   Cherie Ouch, PA-C   Social History   Socioeconomic History   Marital status: Single    Spouse name: Not on file   Number of children: 0   Years of education: Not on file   Highest education level: Not on file  Occupational History   Occupation: CMA    Employer: [employer]    Comment: Primary Care at [employer]  Social Needs   Financial resource strain: Not on file   Food insecurity:    Worry: Not on file    Inability: Not on file   Transportation needs:    Medical: Not on file    Non-medical: Not on file  Tobacco Use   Smoking status: Never Smoker   Smokeless tobacco: Never Used  Substance and Sexual Activity   Alcohol use: Yes    Comment: no drink since 2/20   Drug use: No  Sexual activity: Not Currently    Partners: Male    Birth control/protection: Inserts  Lifestyle   Physical activity:    Days per week: Not on file    Minutes per session: Not on file   Stress: Not on file  Relationships   Social connections:    Talks on phone: Not on file    Gets together: Not on file    Attends religious service: Not on file    Active member of club or organization: Not on file    Attends meetings of clubs or organizations: Not on file    Relationship status: Not on file   Intimate partner violence:    Fear of current or ex partner: Not on file    Emotionally abused: Not on file    Physically abused: Not on file    Forced sexual activity: Not on file  Other Topics Concern   Not on file    Social History Narrative   Lives with parents    Review of Systems Per HPI.     Objective:   Physical Exam Constitutional:      General: She is not in acute distress.    Appearance: She is well-developed.  HENT:     Head: Normocephalic and atraumatic.  Cardiovascular:     Rate and Rhythm: Normal rate.  Pulmonary:     Effort: Pulmonary effort is normal.  Abdominal:     General: Abdomen is flat. There is no distension.     Tenderness: There is no abdominal tenderness. There is no right CVA tenderness or left CVA tenderness.  Neurological:     Mental Status: She is alert and oriented to person, place, and time.    Vitals:   06/17/18 1308  BP: 120/82  Pulse: 97  Resp: 16  Temp: 98.4 F (36.9 C)  TempSrc: Oral  Weight: 259 lb (117.5 kg)  Height: 5\' 8"  (1.727 m)      Results for orders placed or performed in visit on 06/17/18  POCT Microscopic Urinalysis ([facility])  Result Value Ref Range   WBC,UR,HPF,POC Too numerous to count  (A) None WBC/hpf   RBC,UR,HPF,POC Too numerous to count  (A) None RBC/hpf   Bacteria Few (A) None, Too numerous to count   Mucus Absent Absent   Epithelial Cells, UR Per Microscopy Few (A) None, Too numerous to count cells/hpf  POCT urinalysis dipstick  Result Value Ref Range   Color, UA yellow yellow   Clarity, UA cloudy (A) clear   Glucose, UA negative negative mg/dL   Bilirubin, UA negative negative   Ketones, POC UA negative negative mg/dL   Spec Grav, UA 7.353 2.992 - 1.025   Blood, UA large (A) negative   pH, UA 5.5 5.0 - 8.0   Protein Ur, POC =30 (A) negative mg/dL   Urobilinogen, UA 0.2 0.2 or 1.0 E.U./dL   Nitrite, UA Negative Negative   Leukocytes, UA Large (3+) (A) Negative       Assessment & Plan:    Tara Sullivan is a 28 y.o. female Dysuria - Plan: POCT Microscopic Urinalysis ([facility]), POCT urinalysis dipstick, Urine Culture, phenazopyridine (PYRIDIUM) 100 MG tablet  Urinary tract infection with hematuria, site  unspecified - Plan: phenazopyridine (PYRIDIUM) 100 MG tablet, nitrofurantoin, macrocrystal-monohydrate, (MACROBID) 100 MG capsule No signs of Pyelo/upper tract symptoms. Push fluids, start Macrobid, Pyridium as needed for symptoms, potential side effects and risk discussed.  Check urine culture, RTC precautions given  Meds ordered this encounter  Medications  phenazopyridine (PYRIDIUM) 100 MG tablet    Sig: Take 1 tablet (100 mg total) by mouth 3 (three) times daily as needed for pain.    Dispense:  10 tablet    Refill:  0   nitrofurantoin, macrocrystal-monohydrate, (MACROBID) 100 MG capsule    Sig: Take 1 capsule (100 mg total) by mouth 2 (two) times daily.    Dispense:  14 capsule    Refill:  0   Patient Instructions     I do suspect a urinary tract infection with hematuria.  Based on current symptoms and exam, can start antibiotic Macrobid twice per day, Pyridium up to 3 times per day as needed for symptoms.  Make sure to drink plenty of fluids.  If any worsening symptoms, let me know.  I will let you know once I have the urine culture back.  Return to the clinic or go to the nearest emergency room if any of your symptoms worsen or new symptoms occur.   Urinary Tract Infection, Adult  A urinary tract infection (UTI) is an infection of any part of the urinary tract. The urinary tract includes the kidneys, ureters, bladder, and urethra. These organs make, store, and get rid of urine in the body. Your health care provider may use other names to describe the infection. An upper UTI affects the ureters and kidneys (pyelonephritis). A lower UTI affects the bladder (cystitis) and urethra (urethritis). What are the causes? Most urinary tract infections are caused by bacteria in your genital area, around the entrance to your urinary tract (urethra). These bacteria grow and cause inflammation of your urinary tract. What increases the risk? You are more likely to develop this condition  if:  You have a urinary catheter that stays in place (indwelling).  You are not able to control when you urinate or have a bowel movement (you have incontinence).  You are female and you: ? Use a spermicide or diaphragm for birth control. ? Have low estrogen levels. ? Are pregnant.  You have certain genes that increase your risk (genetics).  You are sexually active.  You take antibiotic medicines.  You have a condition that causes your flow of urine to slow down, such as: ? An enlarged prostate, if you are female. ? Blockage in your urethra (stricture). ? A kidney stone. ? A nerve condition that affects your bladder control (neurogenic bladder). ? Not getting enough to drink, or not urinating often.  You have certain medical conditions, such as: ? Diabetes. ? A weak disease-fighting system (immunesystem). ? Sickle cell disease. ? Gout. ? Spinal cord injury. What are the signs or symptoms? Symptoms of this condition include:  Needing to urinate right away (urgently).  Frequent urination or passing small amounts of urine frequently.  Pain or burning with urination.  Blood in the urine.  Urine that smells bad or unusual.  Trouble urinating.  Cloudy urine.  Vaginal discharge, if you are female.  Pain in the abdomen or the lower back. You may also have:  Vomiting or a decreased appetite.  Confusion.  Irritability or tiredness.  A fever.  Diarrhea. The first symptom in older adults may be confusion. In some cases, they may not have any symptoms until the infection has worsened. How is this diagnosed? This condition is diagnosed based on your medical history and a physical exam. You may also have other tests, including:  Urine tests.  Blood tests.  Tests for sexually transmitted infections (STIs). If you have had more than one  UTI, a cystoscopy or imaging studies may be done to determine the cause of the infections. How is this treated? Treatment for  this condition includes:  Antibiotic medicine.  Over-the-counter medicines to treat discomfort.  Drinking enough water to stay hydrated. If you have frequent infections or have other conditions such as a kidney stone, you may need to see a health care provider who specializes in the urinary tract (urologist). In rare cases, urinary tract infections can cause sepsis. Sepsis is a life-threatening condition that occurs when the body responds to an infection. Sepsis is treated in the hospital with IV antibiotics, fluids, and other medicines. Follow these instructions at home:  Medicines  Take over-the-counter and prescription medicines only as told by your health care provider.  If you were prescribed an antibiotic medicine, take it as told by your health care provider. Do not stop using the antibiotic even if you start to feel better. General instructions  Make sure you: ? Empty your bladder often and completely. Do not hold urine for long periods of time. ? Empty your bladder after sex. ? Wipe from front to back after a bowel movement if you are female. Use each tissue one time when you wipe.  Drink enough fluid to keep your urine pale yellow.  Keep all follow-up visits as told by your health care provider. This is important. Contact a health care provider if:  Your symptoms do not get better after 1-2 days.  Your symptoms go away and then return. Get help right away if you have:  Severe pain in your back or your lower abdomen.  A fever.  Nausea or vomiting. Summary  A urinary tract infection (UTI) is an infection of any part of the urinary tract, which includes the kidneys, ureters, bladder, and urethra.  Most urinary tract infections are caused by bacteria in your genital area, around the entrance to your urinary tract (urethra).  Treatment for this condition often includes antibiotic medicines.  If you were prescribed an antibiotic medicine, take it as told by your  health care provider. Do not stop using the antibiotic even if you start to feel better.  Keep all follow-up visits as told by your health care provider. This is important. This information is not intended to replace advice given to you by your health care provider. Make sure you discuss any questions you have with your health care provider. Document Released: 11/28/2004 Document Revised: 08/28/2017 Document Reviewed: 08/28/2017 Elsevier Interactive Patient Education  [employer]2019 [employer]   If you have lab work done today you will be contacted with your lab results within the next 2 weeks.  If you have not heard from us then please contact us. The fastest way to get your results is to register for My Chart.   IF you received an x-ray today, you will receive an invoice from [facility]. Please contact [facility] at [phone] with questions or concerns regarding your invoice.   IF you received labwork today, you will receive an invoice from [facility]. Please contact [facility] at [phone] with questions or concerns regarding your invoice.   Our billing staff will not be able to assist you with questions regarding bills from these companies.  You will be contacted with the lab results as soon as they are available. The fastest way to get your results is to activate your My Chart account. Instructions are located on the last page of this paperwork. If you have not heard from us regarding the results in  2 weeks, please contact this office.       Signed,   Meredith Staggers, MD [facility] at [facility].  06/17/18 2:55 PM

## 2018-06-17 NOTE — Progress Notes (Signed)
0.2

## 2018-06-17 NOTE — Patient Instructions (Addendum)
I do suspect a urinary tract infection with hematuria.  Based on current symptoms and exam, can start antibiotic Macrobid twice per day, Pyridium up to 3 times per day as needed for symptoms.  Make sure to drink plenty of fluids.  If any worsening symptoms, let me know.  I will let you know once I have the urine culture back.  Return to the clinic or go to the nearest emergency room if any of your symptoms worsen or new symptoms occur.   Urinary Tract Infection, Adult  A urinary tract infection (UTI) is an infection of any part of the urinary tract. The urinary tract includes the kidneys, ureters, bladder, and urethra. These organs make, store, and get rid of urine in the body. Your health care provider may use other names to describe the infection. An upper UTI affects the ureters and kidneys (pyelonephritis). A lower UTI affects the bladder (cystitis) and urethra (urethritis). What are the causes? Most urinary tract infections are caused by bacteria in your genital area, around the entrance to your urinary tract (urethra). These bacteria grow and cause inflammation of your urinary tract. What increases the risk? You are more likely to develop this condition if:  You have a urinary catheter that stays in place (indwelling).  You are not able to control when you urinate or have a bowel movement (you have incontinence).  You are female and you: ? Use a spermicide or diaphragm for birth control. ? Have low estrogen levels. ? Are pregnant.  You have certain genes that increase your risk (genetics).  You are sexually active.  You take antibiotic medicines.  You have a condition that causes your flow of urine to slow down, such as: ? An enlarged prostate, if you are female. ? Blockage in your urethra (stricture). ? A kidney stone. ? A nerve condition that affects your bladder control (neurogenic bladder). ? Not getting enough to drink, or not urinating often.  You have certain  medical conditions, such as: ? Diabetes. ? A weak disease-fighting system (immunesystem). ? Sickle cell disease. ? Gout. ? Spinal cord injury. What are the signs or symptoms? Symptoms of this condition include:  Needing to urinate right away (urgently).  Frequent urination or passing small amounts of urine frequently.  Pain or burning with urination.  Blood in the urine.  Urine that smells bad or unusual.  Trouble urinating.  Cloudy urine.  Vaginal discharge, if you are female.  Pain in the abdomen or the lower back. You may also have:  Vomiting or a decreased appetite.  Confusion.  Irritability or tiredness.  A fever.  Diarrhea. The first symptom in older adults may be confusion. In some cases, they may not have any symptoms until the infection has worsened. How is this diagnosed? This condition is diagnosed based on your medical history and a physical exam. You may also have other tests, including:  Urine tests.  Blood tests.  Tests for sexually transmitted infections (STIs). If you have had more than one UTI, a cystoscopy or imaging studies may be done to determine the cause of the infections. How is this treated? Treatment for this condition includes:  Antibiotic medicine.  Over-the-counter medicines to treat discomfort.  Drinking enough water to stay hydrated. If you have frequent infections or have other conditions such as a kidney stone, you may need to see a health care provider who specializes in the urinary tract (urologist). In rare cases, urinary tract infections can cause sepsis. Sepsis is  a life-threatening condition that occurs when the body responds to an infection. Sepsis is treated in the hospital with IV antibiotics, fluids, and other medicines. Follow these instructions at home:  Medicines  Take over-the-counter and prescription medicines only as told by your health care provider.  If you were prescribed an antibiotic medicine, take  it as told by your health care provider. Do not stop using the antibiotic even if you start to feel better. General instructions  Make sure you: ? Empty your bladder often and completely. Do not hold urine for long periods of time. ? Empty your bladder after sex. ? Wipe from front to back after a bowel movement if you are female. Use each tissue one time when you wipe.  Drink enough fluid to keep your urine pale yellow.  Keep all follow-up visits as told by your health care provider. This is important. Contact a health care provider if:  Your symptoms do not get better after 1-2 days.  Your symptoms go away and then return. Get help right away if you have:  Severe pain in your back or your lower abdomen.  A fever.  Nausea or vomiting. Summary  A urinary tract infection (UTI) is an infection of any part of the urinary tract, which includes the kidneys, ureters, bladder, and urethra.  Most urinary tract infections are caused by bacteria in your genital area, around the entrance to your urinary tract (urethra).  Treatment for this condition often includes antibiotic medicines.  If you were prescribed an antibiotic medicine, take it as told by your health care provider. Do not stop using the antibiotic even if you start to feel better.  Keep all follow-up visits as told by your health care provider. This is important. This information is not intended to replace advice given to you by your health care provider. Make sure you discuss any questions you have with your health care provider. Document Released: 11/28/2004 Document Revised: 08/28/2017 Document Reviewed: 08/28/2017 Elsevier Interactive Patient Education  [employer]2019 [employer].   If you have lab work done today you will be contacted with your lab results within the next 2 weeks.  If you have not heard from us then please contact us. The fastest way to get your results is to register for My Chart.   IF you received an x-ray  today, you will receive an invoice from [facility]. Please contact [facility] at [phone] with questions or concerns regarding your invoice.   IF you received labwork today, you will receive an invoice from [facility]. Please contact [facility] at [phone] with questions or concerns regarding your invoice.   Our billing staff will not be able to assist you with questions regarding bills from these companies.  You will be contacted with the lab results as soon as they are available. The fastest way to get your results is to activate your My Chart account. Instructions are located on the last page of this paperwork. If you have not heard from us regarding the results in 2 weeks, please contact this office.

## 2018-06-18 DIAGNOSIS — F332 Major depressive disorder, recurrent severe without psychotic features: Secondary | ICD-10-CM | POA: Diagnosis not present

## 2018-06-18 LAB — URINE CULTURE

## 2018-06-27 DIAGNOSIS — F332 Major depressive disorder, recurrent severe without psychotic features: Secondary | ICD-10-CM | POA: Diagnosis not present

## 2018-07-01 ENCOUNTER — Other Ambulatory Visit: Payer: Self-pay | Admitting: Physician Assistant

## 2018-07-01 NOTE — Telephone Encounter (Signed)
Please submit.

## 2018-07-02 MED FILL — clonazePAM 1 MG TABS: 1 | 23 days supply | Qty: 45 | Fill #0

## 2018-07-04 DIAGNOSIS — F332 Major depressive disorder, recurrent severe without psychotic features: Secondary | ICD-10-CM | POA: Diagnosis not present

## 2018-07-08 ENCOUNTER — Encounter: Payer: Self-pay | Admitting: Physician Assistant

## 2018-07-08 ENCOUNTER — Ambulatory Visit (INDEPENDENT_AMBULATORY_CARE_PROVIDER_SITE_OTHER): Payer: 59 | Admitting: Physician Assistant

## 2018-07-08 ENCOUNTER — Other Ambulatory Visit: Payer: Self-pay

## 2018-07-08 DIAGNOSIS — F411 Generalized anxiety disorder: Secondary | ICD-10-CM | POA: Diagnosis not present

## 2018-07-08 DIAGNOSIS — F1020 Alcohol dependence, uncomplicated: Secondary | ICD-10-CM | POA: Diagnosis not present

## 2018-07-08 DIAGNOSIS — F319 Bipolar disorder, unspecified: Secondary | ICD-10-CM

## 2018-07-08 MED ORDER — LURASIDONE HCL 80 MG PO TABS: 80.0000 mg | ORAL_TABLET | Freq: Every day | ORAL | 2 refills | Status: DC

## 2018-07-08 MED ORDER — OXCARBAZEPINE 300 MG PO TABS: ORAL_TABLET | ORAL | 2 refills | Status: DC

## 2018-07-08 MED FILL — OXcarbazepine 300 MG TABS: 300 | 30 days supply | Qty: 90 | Fill #0

## 2018-07-08 NOTE — Progress Notes (Signed)
[facility]  Patient ID: Tara Sullivan,  MRN: [id number]  PCP: Morrell RiddleWeber, Sarah L, PA-C  Date of Evaluation: 07/08/2018 Time spent:15 minutes  Chief Complaint:  Chief Complaint    Anxiety; Depression; Follow-up     Virtual Visit via Telephone Note  I connected with patient by a video enabled telemedicine application or telephone, with their informed consent, and verified patient privacy and that I am speaking with the correct person using two identifiers.  I am private, in my home and the patient is at home.  I discussed the limitations, risks, security and privacy concerns of performing an evaluation and management service by telephone and the availability of in person appointments. I also discussed with the patient that there may be a patient responsible charge related to this service. The patient expressed understanding and agreed to proceed.   I discussed the assessment and treatment plan with the patient. The patient was provided an opportunity to ask questions and all were answered. The patient agreed with the plan and demonstrated an understanding of the instructions.   The patient was advised to call back or seek an in-person evaluation if the symptoms worsen or if the condition fails to improve as anticipated.  I provided 15 minutes of non-face-to-face time during this encounter.  HISTORY/CURRENT STATUS: HPI for 5-week med check.  At the last visit, we added Trileptal for irritability.  States she is not really much better.  She is not sure if the drug is not working or whether she is irritable because of having to stay at home all the time.  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  Being at home gets old, she stated.  She denies increased energy with decreased need for sleep.  No impulsivity or risky behavior.  No increased spending or libido.  No grandiosity.  No  hallucinations.  Sleep is adequate.  Anxiety is controlled.  She was talking to her therapist about not being able to focus and stay on task.  Her therapist did a screening test and some of the answers pointed toward ADD.  Patient tells me that the therapist sent a note, which I have not received because I am not in the office.  Patient denies loss of interest in usual activities and is able to enjoy things.  Denies decreased energy or motivation.  Appetite has not changed.  No extreme sadness, tearfulness, or feelings of hopelessness.  Denies suicidal or homicidal thoughts.  Denies dizziness, syncope, seizures, numbness, tingling, tremor, tics, unsteady gait, slurred speech, confusion.  Denies muscle or joint pain, stiffness, or dystonia.  Individual Medical History/ Review of Systems: Changes? :No    Past medications for mental health diagnoses include: Zoloft, Seroquel, lithium, Latuda, Wellbutrin, BuSpar, Pristiq, Klonopin, Prazosin x 1 night, then no longer needed.  Allergies: Latex  Current Medications:  Current Outpatient Medications:  .  clonazePAM (KLONOPIN) 1 MG tablet, TAKE 1 TABLET (1 MG TOTAL) BY MOUTH 2 (TWO) TIMES DAILY AS NEEDED FOR ANXIETY., Disp: 45 tablet, Rfl: 1 .  etonogestrel-ethinyl estradiol (NUVARING) 0.12-0.015 MG/24HR vaginal ring, Insert vaginally and leave in place for 3 consecutive weeks, then remove for 1 week., Disp: 3 each, Rfl: 4 .  lamoTRIgine (LAMICTAL) 100 MG tablet, Take 1 tablet (100 mg total) by mouth 2 (two) times daily., Disp: 60 tablet, Rfl: 2 .  lurasidone (LATUDA) 80  MG TABS tablet, Take 1 tablet (80 mg total) by mouth daily with supper. TAKE IN THE EVENING WITH FOOD, Disp: 30 tablet, Rfl: 2 .  fluticasone (FLONASE) 50 MCG/ACT nasal spray, Place 2 sprays into both nostrils daily for 10 days., Disp: 16 g, Rfl: 0 .  nitrofurantoin, macrocrystal-monohydrate, (MACROBID) 100 MG capsule, Take 1 capsule (100 mg total) by mouth 2 (two) times daily. (Patient  not taking: Reported on 07/08/2018), Disp: 14 capsule, Rfl: 0 .  Oxcarbazepine (TRILEPTAL) 300 MG tablet, 1 po q am, 2 po qhs., Disp: 90 tablet, Rfl: 2 .  phenazopyridine (PYRIDIUM) 100 MG tablet, Take 1 tablet (100 mg total) by mouth 3 (three) times daily as needed for pain. (Patient not taking: Reported on 07/08/2018), Disp: 10 tablet, Rfl: 0 Medication Side Effects: none  Family Medical/ Social History: Changes? Yes see HPI  MENTAL HEALTH EXAM:  There were no vitals taken for this visit.There is no height or weight on file to calculate BMI.  General Appearance: Unable to assess  Eye Contact:  Unable to assess  Speech:  Clear and Coherent  Volume:  Normal  Mood:  Euthymic  Affect:  Unable to assess  Thought Process:  Goal Directed  Orientation:  Full (Time, Place, and Person)  Thought Content: Logical   Suicidal Thoughts:  No  Homicidal Thoughts:  No  Memory:  WNL  Judgement:  Good  Insight:  Good  Psychomotor Activity:  Unable to assess  Concentration:  Concentration: Good and Attention Span: Fair  Recall:  Good  Fund of Knowledge: Good  Language: Good  Assets:  Desire for Improvement  ADL's:  Intact  Cognition: WNL  Prognosis:  Good    DIAGNOSES:    ICD-10-CM   1. Bipolar I disorder (HCC) F31.9   2. Alcohol use disorder, moderate, dependence (HCC) F10.20   3. Generalized anxiety disorder F41.1     Receiving Psychotherapy: Yes    RECOMMENDATIONS:  I will review the therapist letter concerning possible ADD/ADHD.  Briefly discussed different treatment options with the patient which would include non-stimulant therapy since she is taking the Klonopin fairly regularly right now.  We will discuss that further after I get the information from her counselor. Continue Klonopin 1 mg twice daily as needed. Continue Lamictal 100 mg 1 twice daily. Continue Latuda 80 mg q. evening with food. Increase Trileptal to 300 mg 1 every morning and 2 nightly. Continue  psychotherapy. Return in 4 to 6 weeks.   Melony Overly, PA-C

## 2018-07-11 DIAGNOSIS — F332 Major depressive disorder, recurrent severe without psychotic features: Secondary | ICD-10-CM | POA: Diagnosis not present

## 2018-07-18 DIAGNOSIS — F332 Major depressive disorder, recurrent severe without psychotic features: Secondary | ICD-10-CM | POA: Diagnosis not present

## 2018-07-21 DIAGNOSIS — R109 Unspecified abdominal pain: Secondary | ICD-10-CM | POA: Diagnosis not present

## 2018-07-25 DIAGNOSIS — F332 Major depressive disorder, recurrent severe without psychotic features: Secondary | ICD-10-CM | POA: Diagnosis not present

## 2018-08-01 DIAGNOSIS — F332 Major depressive disorder, recurrent severe without psychotic features: Secondary | ICD-10-CM | POA: Diagnosis not present

## 2018-08-08 DIAGNOSIS — F332 Major depressive disorder, recurrent severe without psychotic features: Secondary | ICD-10-CM | POA: Diagnosis not present

## 2018-08-11 MED FILL — LAMOTRIGINE 100 MG TABS: 100 | 30 days supply | Qty: 60 | Fill #1

## 2018-08-18 ENCOUNTER — Encounter: Payer: Self-pay | Admitting: Physician Assistant

## 2018-08-18 ENCOUNTER — Ambulatory Visit: Payer: 59 | Admitting: Physician Assistant

## 2018-08-18 ENCOUNTER — Other Ambulatory Visit: Payer: Self-pay

## 2018-08-18 DIAGNOSIS — F1011 Alcohol abuse, in remission: Secondary | ICD-10-CM | POA: Diagnosis not present

## 2018-08-18 DIAGNOSIS — F319 Bipolar disorder, unspecified: Secondary | ICD-10-CM

## 2018-08-18 DIAGNOSIS — F988 Other specified behavioral and emotional disorders with onset usually occurring in childhood and adolescence: Secondary | ICD-10-CM | POA: Diagnosis not present

## 2018-08-18 MED ORDER — ATOMOXETINE HCL 25 MG PO CAPS: 25.0000 mg | ORAL_CAPSULE | Freq: Every day | ORAL | 1 refills | Status: DC

## 2018-08-18 MED FILL — ATOMOXETINE HCL 25 MG CAPS: 25 | 30 days supply | Qty: 30 | Fill #0

## 2018-08-18 MED FILL — ETONOGESTREL-ETHINYL ESTRAD: 0.12-0.015 | 84 days supply | Qty: 3 | Fill #1

## 2018-08-18 NOTE — Progress Notes (Signed)
[facility]  Patient ID: Tara Sullivan,  MRN: [id number]  PCP: Larkin InaWeber, Sarah L, PA-C   Date of Evaluation: 08/18/2018 Time spent:25 minutes  Chief Complaint:  Chief Complaint    Depression; Anxiety; Follow-up      HISTORY/CURRENT STATUS: HPI For routine med check.   Has not been taking the Trileptal.  It made her nauseated every time she took it and she could not tolerate that.  She has not been consistent taking her Lamictal either.  She has felt like it does not really help in any way and she often forgets to take it so she just stopped it about a week ago.  Does not feel any different.  No withdrawal symptoms.  She has been better about taking the [place].  Feels that it has helped some.  She is not taking the Klonopin very often either.  She usually does take it at night but states does not always need it to help with anxiety or sleep.  Her mom takes care of her medications.   Has had increased stress over the past few weeks, lost her job.  States her supervisor has not been "the best" and she is kind of glad not to be working there.  However she does need to work and wants to stay within the [facility].  She will be having a meeting with someone and HR sometime hopefully soon, to see if she can still work in the [facility] system.  States she is not as anxious or sad about it as she could be.  Says it is kind of a relief work there.  She still has problems focusing.  This is something that we have discussed off and on for the past 6 months or so.  She was tested and shown to have ADD.  I have been hesitant to treat that because of her other issues with medication changes that were needed.  She has trouble with easy distractions and finishing tasks.  She has never been on anything for the ADD.  Patient denies loss of interest in usual activities and is able to enjoy things.  Denies decreased energy or motivation.  Appetite has not changed.  No extreme sadness,  tearfulness, or feelings of hopelessness.  Denies any changes in concentration, making decisions or remembering things.  Denies suicidal or homicidal thoughts.  Patient denies increased energy with decreased need for sleep, no increased talkativeness, no racing thoughts, no impulsivity or risky behaviors, no increased spending, no increased libido, no grandiosity.  Denies dizziness, syncope, seizures, numbness, tingling, tremor, tics, unsteady gait, slurred speech, confusion. Denies muscle or joint pain, stiffness, or dystonia.  Individual Medical History/ Review of Systems: Changes? :No    Past medications for mental health diagnoses include: Zoloft, Seroquel, lithium, Latuda, Wellbutrin, BuSpar, Pristiq, Klonopin, Prazosin x 1 night, then no longer needed.  Allergies: Latex and Trileptal [oxcarbazepine]  Current Medications:  Current Outpatient Medications:  .  clonazePAM (KLONOPIN) 1 MG tablet, TAKE 1 TABLET (1 MG TOTAL) BY MOUTH 2 (TWO) TIMES DAILY AS NEEDED FOR ANXIETY., Disp: 45 tablet, Rfl: 1 .  etonogestrel-ethinyl estradiol (NUVARING) 0.12-0.015 MG/24HR vaginal ring, Insert vaginally and leave in place for 3 consecutive weeks, then remove for 1 week., Disp: 3 each, Rfl: 4 .  lamoTRIgine (LAMICTAL) 100 MG tablet, Take 1 tablet (100 mg total) by mouth 2 (two) times daily., Disp: 60 tablet, Rfl: 2 .  lurasidone (LATUDA) 80 MG TABS tablet, Take 1 tablet (80 mg total) by  mouth daily with supper. TAKE IN THE EVENING WITH FOOD, Disp: 30 tablet, Rfl: 2 .  atomoxetine (STRATTERA) 25 MG capsule, Take 1 capsule (25 mg total) by mouth daily., Disp: 30 capsule, Rfl: 1 .  fluticasone (FLONASE) 50 MCG/ACT nasal spray, Place 2 sprays into both nostrils daily for 10 days., Disp: 16 g, Rfl: 0 .  nitrofurantoin, macrocrystal-monohydrate, (MACROBID) 100 MG capsule, Take 1 capsule (100 mg total) by mouth 2 (two) times daily. (Patient not taking: Reported on 07/08/2018), Disp: 14 capsule, Rfl: 0 .   Oxcarbazepine (TRILEPTAL) 300 MG tablet, 1 po q am, 2 po qhs. (Patient not taking: Reported on 08/18/2018), Disp: 90 tablet, Rfl: 2 .  phenazopyridine (PYRIDIUM) 100 MG tablet, Take 1 tablet (100 mg total) by mouth 3 (three) times daily as needed for pain. (Patient not taking: Reported on 07/08/2018), Disp: 10 tablet, Rfl: 0 Medication Side Effects: none  Family Medical/ Social History: Changes? Yes got fired from her job 2 weeks ago.    MENTAL HEALTH EXAM:  There were no vitals taken for this visit.There is no height or weight on file to calculate BMI.  General Appearance: Casual and Obese  Eye Contact:  Good  Speech:  Clear and Coherent  Volume:  Normal  Mood:  Euthymic  Affect:  Appropriate  Thought Process:  Goal Directed  Orientation:  Full (Time, Place, and Person)  Thought Content: Logical   Suicidal Thoughts:  No  Homicidal Thoughts:  No  Memory:  WNL  Judgement:  Good  Insight:  Good  Psychomotor Activity:  Normal  Concentration:  Concentration: Fair and Attention Span: Fair  Recall:  Good  Fund of Knowledge: Good  Language: Good  Assets:  Desire for Improvement  ADL's:  Intact  Cognition: WNL  Prognosis:  Good    DIAGNOSES:    ICD-10-CM   1. Bipolar I disorder ([place])  F31.9   2. 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  She prefers to stay off of it as she felt no improvement with taking it. DC Lamictal. DC Trileptal. Continue Latuda 80 mg p.o. q. evening with food. Continue Klonopin 1 mg twice daily as needed.  She understands not to take it unless absolutely needed in the evening. We discussed the ADD.  I really do not want to put her disorder and the fact that  she takes Klonopin.  I think it is best to use Strattera.  We discussed benefits, risks, side effects and she accepts. Start Strattera 25 mg every morning. Continue psychotherapy with Jacqulyn Bath. Return in 4 to 6 weeks.  Donnal Moat, PA-C   This record has been created using [employer].  Chart creation errors have been sought, but may not always have been located and corrected. Such creation errors do not reflect on the standard of medical care.

## 2018-08-22 DIAGNOSIS — F332 Major depressive disorder, recurrent severe without psychotic features: Secondary | ICD-10-CM | POA: Diagnosis not present

## 2018-08-29 DIAGNOSIS — F332 Major depressive disorder, recurrent severe without psychotic features: Secondary | ICD-10-CM | POA: Diagnosis not present

## 2018-09-17 ENCOUNTER — Ambulatory Visit: Payer: 59 | Admitting: Physician Assistant

## 2018-10-02 MED FILL — ATOMOXETINE HCL 25 MG CAPS: 25 | 30 days supply | Qty: 30 | Fill #1

## 2018-10-19 ENCOUNTER — Telehealth: Payer: Self-pay | Admitting: Physician Assistant

## 2018-10-19 NOTE — Telephone Encounter (Signed)
Patient's been off of [place] for 2 months due to insurance.  Patient canceled appt., for 08/19 due to work.

## 2018-10-19 NOTE — Telephone Encounter (Signed)
Did she resched?  Does she want to discuss another med?  Or was she just giving [place] that info?

## 2018-10-19 NOTE — Telephone Encounter (Signed)
ok 

## 2018-10-21 ENCOUNTER — Ambulatory Visit: Payer: Self-pay | Admitting: Physician Assistant

## 2018-10-27 ENCOUNTER — Telehealth: Payer: Self-pay | Admitting: Physician Assistant

## 2018-10-27 NOTE — Telephone Encounter (Signed)
Pt RS for 9/30 ( next avail)  . She has been off of meds because of inability to afford med while no insurance. Would like to continue the Strattera because mother can help her with the cost, but she would like to increase the dose if possible. Please advise.

## 2018-10-28 ENCOUNTER — Other Ambulatory Visit: Payer: Self-pay | Admitting: Physician Assistant

## 2018-10-28 NOTE — Telephone Encounter (Signed)
Please let her know I'll increase Strattera to 40 mg.  Let me know what pharmacy to send it to.  Thanks

## 2018-10-29 ENCOUNTER — Other Ambulatory Visit: Payer: Self-pay | Admitting: Physician Assistant

## 2018-10-29 ENCOUNTER — Telehealth: Payer: Self-pay | Admitting: Physician Assistant

## 2018-10-29 MED ORDER — ATOMOXETINE HCL 40 MG PO CAPS: 40.0000 mg | ORAL_CAPSULE | Freq: Every day | ORAL | 1 refills | Status: DC

## 2018-10-29 MED FILL — ATOMOXETINE HCL 40 MG CAPS: 40 | 30 days supply | Qty: 30 | Fill #0

## 2018-10-29 NOTE — Telephone Encounter (Signed)
Rx already submitted to [facility]

## 2018-10-29 NOTE — Telephone Encounter (Signed)
Tara Sullivan I left voicemail to call back with her pharmacy

## 2018-10-29 NOTE — Telephone Encounter (Signed)
Rx sent 

## 2018-10-29 NOTE — Telephone Encounter (Signed)
Please send Strattera 40 mg to [facility]

## 2018-11-30 MED FILL — FLUCONAZOLE 150 MG TABS: 150 | 3 days supply | Qty: 2 | Fill #0

## 2018-12-02 ENCOUNTER — Encounter: Payer: Self-pay | Admitting: Physician Assistant

## 2018-12-02 ENCOUNTER — Other Ambulatory Visit: Payer: Self-pay

## 2018-12-02 ENCOUNTER — Ambulatory Visit (INDEPENDENT_AMBULATORY_CARE_PROVIDER_SITE_OTHER): Payer: PRIVATE HEALTH INSURANCE | Admitting: Physician Assistant

## 2018-12-02 DIAGNOSIS — F319 Bipolar disorder, unspecified: Secondary | ICD-10-CM | POA: Diagnosis not present

## 2018-12-02 DIAGNOSIS — R454 Irritability and anger: Secondary | ICD-10-CM | POA: Diagnosis not present

## 2018-12-02 DIAGNOSIS — F988 Other specified behavioral and emotional disorders with onset usually occurring in childhood and adolescence: Secondary | ICD-10-CM | POA: Diagnosis not present

## 2018-12-02 DIAGNOSIS — F1011 Alcohol abuse, in remission: Secondary | ICD-10-CM

## 2018-12-02 MED ORDER — ATOMOXETINE HCL 60 MG PO CAPS: 60.0000 mg | ORAL_CAPSULE | Freq: Every day | ORAL | 1 refills | Status: DC

## 2018-12-02 MED ORDER — BREXPIPRAZOLE 2 MG PO TABS: 2.0000 mg | ORAL_TABLET | Freq: Every day | ORAL | 1 refills | Status: DC

## 2018-12-02 MED FILL — REXULTI 2 MG TABLET: 2 | 30 days supply | Qty: 30 | Fill #0

## 2018-12-02 NOTE — Progress Notes (Signed)
[facility]  Patient ID: Tara Sullivan,  MRN: [id number]  PCP: Mancel Bale, PA-C  Date of Evaluation: 12/02/2018 Time spent:25 minutes  Chief Complaint:  Chief Complaint    Follow-up      HISTORY/CURRENT STATUS: HPI for routine med check.  She has new job at the urology office.  She has been there about a month now and likes it.  She is having some anxiety with having to learn new things however.  She has some Klonopin on hand but has not taken it in months.  Complains of being more irritable for "a while now."  Easy to get angry and sometimes for no reason.  The Strattera did help initially with her focus but does not seem to be as helpful now.  One good thing coming from it is that if she drinks any alcohol at all, it gives her diarrhea since she has been taking the Strattera.  So she is not drinking nearly as much as she has in the past.  Patient denies loss of interest in usual activities and is able to enjoy things.  Denies decreased energy or motivation.  Appetite has not changed.  No extreme sadness, tearfulness, or feelings of hopelessness.  Denies any changes in concentration, making decisions or remembering things.  Denies suicidal or homicidal thoughts.  Patient denies increased energy with decreased need for sleep, no increased talkativeness, no racing thoughts, no impulsivity or risky behaviors, no increased spending, no increased libido, no grandiosity.  Denies dizziness, syncope, seizures, numbness, tingling, tremor, tics, unsteady gait, slurred speech, confusion. Denies muscle or joint pain, stiffness, or dystonia.  Individual Medical History/ Review of Systems: Changes? :No    Past medications for mental health diagnoses include: Zoloft, Seroquel, lithium, Latuda caused stomach pain, Wellbutrin, BuSpar, Pristiq, Klonopin, Prazosin x 1 night, then no longer needed, Strattera, Trileptal caused nausea, Lamictal didn't help.  Allergies: Latex and  Trileptal [oxcarbazepine]  Current Medications:  Current Outpatient Medications:  .  etonogestrel-ethinyl estradiol (NUVARING) 0.12-0.015 MG/24HR vaginal ring, Insert vaginally and leave in place for 3 consecutive weeks, then remove for 1 week., Disp: 3 each, Rfl: 4 .  atomoxetine (STRATTERA) 60 MG capsule, Take 1 capsule (60 mg total) by mouth daily., Disp: 30 capsule, Rfl: 1 .  brexpiprazole (REXULTI) 2 MG TABS tablet, Take 1 tablet (2 mg total) by mouth daily., Disp: 30 tablet, Rfl: 1 .  clonazePAM (KLONOPIN) 1 MG tablet, TAKE 1 TABLET (1 MG TOTAL) BY MOUTH 2 (TWO) TIMES DAILY AS NEEDED FOR ANXIETY. (Patient not taking: Reported on 12/02/2018), Disp: 45 tablet, Rfl: 1 .  fluticasone (FLONASE) 50 MCG/ACT nasal spray, Place 2 sprays into both nostrils daily for 10 days., Disp: 16 g, Rfl: 0 .  nitrofurantoin, macrocrystal-monohydrate, (MACROBID) 100 MG capsule, Take 1 capsule (100 mg total) by mouth 2 (two) times daily. (Patient not taking: Reported on 07/08/2018), Disp: 14 capsule, Rfl: 0 .  phenazopyridine (PYRIDIUM) 100 MG tablet, Take 1 tablet (100 mg total) by mouth 3 (three) times daily as needed for pain. (Patient not taking: Reported on 07/08/2018), Disp: 10 tablet, Rfl: 0 Medication Side Effects: Diarrhea when she drinks alcohol since being on the [facility]  Family Medical/ Social History: Changes? Yes new job.  See above  MENTAL HEALTH EXAM:  There were no vitals taken for this visit.There is no height or weight on file to calculate BMI.  General Appearance: Casual, Neat, Well Groomed and Obese  Eye Contact:  Good  Speech:  Clear and Coherent  Volume:  Normal  Mood:  Euthymic  Affect:  Appropriate  Thought Process:  Goal Directed and Descriptions of Associations: Intact  Orientation:  Full (Time, Place, and Person)  Thought Content: Logical   Suicidal Thoughts:  No  Homicidal Thoughts:  No  Memory:  WNL  Judgement:  Good  Insight:  Good  Psychomotor Activity:  Normal   Concentration:  Concentration: Fair and Attention Span: Fair  Recall:  Good  Fund of Knowledge: Good  Language: Good  Assets:  Desire for Improvement  ADL's:  Intact  Cognition: WNL  Prognosis:  Good    DIAGNOSES:    ICD-10-CM   1. Bipolar I disorder (HCC)  F31.9   2. 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 Alcohol use disorder, mild, in early remission  F10.11   4. Irritability  R45.4     Receiving Psychotherapy: No    RECOMMENDATIONS:  I spent 25 minutes with her and 50% of that face-to-face time was in counseling concerning her diagnosis and treatment options. She needs to be on a mood stabilizer or an atypical antipsychotic due to the bipolar disorder.  That will also help the irritability.  We discussed Depakote, even at a low dose could be beneficial.  After discussing the possibility of weight gain, she prefers not to do that at this point.  The same with Zyprexa, Abilify, Geodon.  We discussed the newer drugs including a Rexulti and Vraylar and she would like to try 1 of those.  She was unable to stay on the Latuda due to cost and it caused abdominal pain.  We discussed benefits, risks, side effects of the Rexulti and Vraylar. Start Rexulti 1 mg for 4 days then 2 mg.  Co-pay card was given to her. Increase Strattera to 60 mg every morning. Continue Klonopin 1 mg twice daily as needed.  She has this drug on hand but has not been taking it. Return in 4 to 6 weeks.  Melony Overly, PA-C

## 2018-12-09 MED FILL — ATOMOXETINE HCL 60 MG CAPS: 60 | 30 days supply | Qty: 30 | Fill #0

## 2018-12-30 ENCOUNTER — Other Ambulatory Visit: Payer: Self-pay

## 2018-12-30 ENCOUNTER — Ambulatory Visit (INDEPENDENT_AMBULATORY_CARE_PROVIDER_SITE_OTHER): Payer: PRIVATE HEALTH INSURANCE | Admitting: Physician Assistant

## 2018-12-30 ENCOUNTER — Encounter: Payer: Self-pay | Admitting: Physician Assistant

## 2018-12-30 VITALS — BP 139/106 | HR 84

## 2018-12-30 DIAGNOSIS — F988 Other specified behavioral and emotional disorders with onset usually occurring in childhood and adolescence: Secondary | ICD-10-CM | POA: Diagnosis not present

## 2018-12-30 DIAGNOSIS — R454 Irritability and anger: Secondary | ICD-10-CM

## 2018-12-30 DIAGNOSIS — F319 Bipolar disorder, unspecified: Secondary | ICD-10-CM

## 2018-12-30 MED ORDER — AMPHETAMINE-DEXTROAMPHETAMINE 10 MG PO TABS: 10.0000 mg | ORAL_TABLET | Freq: Every day | ORAL | 0 refills | Status: DC

## 2018-12-30 NOTE — Progress Notes (Signed)
[facility]  Patient ID: Tara Sullivan,  MRN: [id number]  PCP: Morrell Riddle, PA-C  Date of Evaluation: 12/30/2018 Time spent:15 minutes  Chief Complaint:  Chief Complaint    Follow-up      HISTORY/CURRENT STATUS: HPI for routine med check.  Still complains of being more irritable for "a while now."  Easy to get angry and sometimes for no reason. Not any better or worse. She took the sample pack of Rexulti but forgot about the Rx she had picked up, so she didn't stay on it.   The Strattera wasn't helping anymore and was causing nausea so she stopped it. Really struggling w/ focus in the afternoon. 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  A dr will be talking to her about something and she's thinking about something altogether different. Can't stay on task.  Things take her longer to do than they do other people because she gets so distracted.   Patient denies loss of interest in usual activities and is able to enjoy things.  Denies decreased energy or motivation.  Appetite has not changed.  No extreme sadness, tearfulness, or feelings of hopelessness.  Denies any changes in concentration, making decisions or remembering things.  Denies suicidal or homicidal thoughts.  Patient denies increased energy with decreased need for sleep, no increased talkativeness, no racing thoughts, no impulsivity or risky behaviors, no increased spending, no increased libido, no grandiosity.  Denies dizziness, syncope, seizures, numbness, tingling, tremor, tics, unsteady gait, slurred speech, confusion. Denies muscle or joint pain, stiffness, or dystonia.  Individual Medical History/ Review of Systems: Changes? :No    Past medications for mental health diagnoses include: Zoloft, Seroquel, lithium, Latuda caused stomach pain, Wellbutrin, BuSpar, Pristiq, Klonopin, Prazosin x 1 night, then no longer needed, Strattera, Trileptal caused nausea, Lamictal didn't  help.  Allergies: Latex and Trileptal [oxcarbazepine]  Current Medications:  Current Outpatient Medications:  .  etonogestrel-ethinyl estradiol (NUVARING) 0.12-0.015 MG/24HR vaginal ring, Insert vaginally and leave in place for 3 consecutive weeks, then remove for 1 week., Disp: 3 each, Rfl: 4 .  amphetamine-dextroamphetamine (ADDERALL) 10 MG tablet, Take 1 tablet (10 mg total) by mouth daily with breakfast., Disp: 30 tablet, Rfl: 0 .  brexpiprazole (REXULTI) 2 MG TABS tablet, Take 1 tablet (2 mg total) by mouth daily. (Patient not taking: Reported on 12/30/2018), Disp: 30 tablet, Rfl: 1 .  clonazePAM (KLONOPIN) 1 MG tablet, TAKE 1 TABLET (1 MG TOTAL) BY MOUTH 2 (TWO) TIMES DAILY AS NEEDED FOR ANXIETY. (Patient not taking: Reported on 12/02/2018), Disp: 45 tablet, Rfl: 1 .  fluticasone (FLONASE) 50 MCG/ACT nasal spray, Place 2 sprays into both nostrils daily for 10 days., Disp: 16 g, Rfl: 0 .  nitrofurantoin, macrocrystal-monohydrate, (MACROBID) 100 MG capsule, Take 1 capsule (100 mg total) by mouth 2 (two) times daily. (Patient not taking: Reported on 07/08/2018), Disp: 14 capsule, Rfl: 0 .  phenazopyridine (PYRIDIUM) 100 MG tablet, Take 1 tablet (100 mg total) by mouth 3 (three) times daily as needed for pain. (Patient not taking: Reported on 07/08/2018), Disp: 10 tablet, Rfl: 0 Medication Side Effects: none  Family Medical/ Social History: Changes? No  MENTAL HEALTH EXAM:  Blood pressure (!) 139/106, pulse 84.There is no height or weight on file to calculate BMI.  General Appearance: Casual, Neat, Well Groomed and Obese  Eye Contact:  Good  Speech:  Clear and Coherent  Volume:  Normal  Mood:  Euthymic  Affect:  Appropriate  Thought Process:  Goal Directed and Descriptions of Associations: Intact  Orientation:  Full (Time, Place, and Person)  Thought Content: Logical   Suicidal Thoughts:  No  Homicidal Thoughts:  No  Memory:  WNL  Judgement:  Good  Insight:  Good  Psychomotor Activity:   Normal  Concentration:  Concentration: Fair and Attention Span: Fair  Recall:  Good  Fund of Knowledge: Good  Language: Good  Assets:  Desire for Improvement  ADL's:  Intact  Cognition: WNL  Prognosis:  Good    DIAGNOSES:    ICD-10-CM   1. Bipolar I disorder ([place])  F31.9   2. 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 Irritability  R45.4     Receiving Psychotherapy: No    RECOMMENDATIONS:  Start Adderall 10 mg, 1 p.o. daily.  She can take it at lunchtime since the worst of the easy distractibility is right after lunch.  I have given her leeway to take 1 in the morning if needed or 1 at lunch, or half twice daily as needed.  She can even take 1 twice a day but knowing she will run out early. Discussed potential benefits, risks, and side effects of stimulants with patient to include increased heart rate, palpitations, insomnia, increased anxiety, increased irritability, or decreased appetite.  The patient understands and accepts these risks.  Instructed patient to contact office if experiencing any significant tolerability issues. We discussed her blood pressure and she states it is never been this high before.  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  If it stays in the 140/90 or above range she needs to let me know and not take the Adderall. She knows that Klonopin will be used rarely when also on a stimulant.  It's been >6 months or about that, since she's taken it.  Her mom has control of it.  Restart Rexulti 0.5mg  for 1 week then, 1 mg for 1 week,  then 2 mg.  Sample pack given. Return in 4 weeks.  Donnal Moat, PA-C

## 2018-12-31 ENCOUNTER — Telehealth: Payer: Self-pay

## 2018-12-31 MED FILL — AMPHETAMINE-DEXTROAMPHETAMI: 10 | 30 days supply | Qty: 30 | Fill #0

## 2018-12-31 NOTE — Telephone Encounter (Signed)
Prior authorization submitted and approved for Amphetamine-Dextroamphetamine 10 mg 1/day through [employer] effective 12/31/2018-12/31/2019

## 2019-01-13 ENCOUNTER — Telehealth: Payer: Self-pay | Admitting: Physician Assistant

## 2019-01-13 NOTE — Telephone Encounter (Signed)
Pt has experimented with dose of Adderall as discussed. The combo of taking 1 pill in the am and 1 pill at lunch is working great. However, she cannot sleep at night. Pt wonders if you have suggestions to correct this.

## 2019-01-13 NOTE — Telephone Encounter (Signed)
Please recommend melatonin, if she has never taken it.  It can be over-the-counter at usually three, five or 10 mg I would try the 5 mg first and take about 30 minutes or so before she wants to go to sleep.  If she has already tried that and it is not effective, send in trazodone 50 mg #60, 1-2 nightly as needed sleep.  I don't see that she is ever tried that.  Discussed no caffeine after 2 or 3:00 in the afternoon.  She should not be on her phone or tablet within 2 hours prior to wanting to go to sleep, or she needs to wear blue blocking, amber-colored glasses if she wants to do that.  Lets try these things first before we consider adding a "sleeping pill."

## 2019-01-14 NOTE — Telephone Encounter (Signed)
Left voicemail to call back with information 

## 2019-01-14 NOTE — Telephone Encounter (Signed)
Patient called back, given recommendation to try melatonin; she has tried before but it's been awhile so she will try that. Recommended she adjust dose as needed. She agreed, also discussed good sleep hygiene with her. She did mentioned about 4 years ago her PCP put her on a couple sleep meds but they made her feel "hung over" the next day. Not sure of the names but will check with PCP to get those added to her chart. Discussed time of day she's taking her second Adderall and she did bump it to about 10:30-11 am and it seems to be working better for her. Only issue is her sleep now. Advised to call back with worsening symptoms.

## 2019-01-18 ENCOUNTER — Other Ambulatory Visit: Payer: Self-pay | Admitting: Physician Assistant

## 2019-01-18 ENCOUNTER — Telehealth: Payer: Self-pay | Admitting: Physician Assistant

## 2019-01-18 MED ORDER — AMPHETAMINE-DEXTROAMPHETAMINE 10 MG PO TABS: ORAL_TABLET | ORAL | 0 refills | Status: DC

## 2019-01-18 MED FILL — AMPHETAMINE-DEXTROAMPHETAMI: 10 | 30 days supply | Qty: 60 | Fill #0

## 2019-01-18 NOTE — Telephone Encounter (Signed)
Pt has been changing dose of Adderall and is taking 2 a day. She will need a refill b/c she's out in two days. [facility] pharmacy

## 2019-01-18 NOTE — Telephone Encounter (Signed)
Rx sent in

## 2019-01-27 ENCOUNTER — Ambulatory Visit (INDEPENDENT_AMBULATORY_CARE_PROVIDER_SITE_OTHER): Payer: PRIVATE HEALTH INSURANCE | Admitting: Physician Assistant

## 2019-01-27 ENCOUNTER — Encounter: Payer: Self-pay | Admitting: Physician Assistant

## 2019-01-27 ENCOUNTER — Other Ambulatory Visit: Payer: Self-pay

## 2019-01-27 VITALS — BP 113/98 | HR 99

## 2019-01-27 DIAGNOSIS — F319 Bipolar disorder, unspecified: Secondary | ICD-10-CM | POA: Diagnosis not present

## 2019-01-27 DIAGNOSIS — F988 Other specified behavioral and emotional disorders with onset usually occurring in childhood and adolescence: Secondary | ICD-10-CM

## 2019-01-27 MED ORDER — BREXPIPRAZOLE 2 MG PO TABS: 2.0000 mg | ORAL_TABLET | Freq: Every day | ORAL | 5 refills | Status: DC

## 2019-01-27 NOTE — Progress Notes (Signed)
[facility]  Patient ID: Tara Sullivan,  MRN: [id number]  PCP: Mittie Bodo  Date of Evaluation: 01/27/2019 Time spent:15 minutes  Chief Complaint:  Chief Complaint    ADD; Anxiety      HISTORY/CURRENT STATUS: HPI for routine med check.  At the last visit we started Adderall.  She denies any manic symptoms whatsoever.  No increased energy with decreased need for sleep.  No impulsivity or risky behavior.  No increased libido.  She has been spending a little bit more money than usual but states it is due to the Christmas season.    She has felt good about the Adderall.  The only problem is it has worn off quickly.  She only gets about 4 hours of focus time.  She called and we increased from 10 mg in the morning only to 10 mg in the morning and 10 mg at lunch.  That has helped for the first week or so but now that seems to be wearing off.  She did have palpitations for the first few days after starting the Adderall but no symptoms now.  She also had loose stools after taking the pills but that has resolved as well.  BPs at home have been around 125/79 at home on average.  She is a CMA and checks it routinely.  Rarely has anxiety.  Her mom has her medications and dispenses them as directed.  Has not needed the Klonopin in a long time.  She sleeps well now.  When she first started the Adderall she had trouble going to sleep some nights that first week but that is better now and she goes to sleep fine.  She feels rested when she gets up.  Patient denies loss of interest in usual activities and is able to enjoy things.  Denies decreased energy or motivation.  Appetite has not changed.  No extreme sadness, tearfulness, or feelings of hopelessness.  Denies any changes in concentration, making decisions or remembering things.  Denies suicidal or homicidal thoughts.  Denies dizziness, syncope, seizures, numbness, tingling, tremor, tics, unsteady gait, slurred speech,  confusion. Denies muscle or joint pain, stiffness, or dystonia.  Individual Medical History/ Review of Systems: Changes? :No    Past medications for mental health diagnoses include: Zoloft, Seroquel, lithium, Latuda caused stomach pain, Wellbutrin, BuSpar, Pristiq, Klonopin, Prazosin x 1 night, then no longer needed, Strattera, Trileptal caused nausea, Lamictal didn't help.  Allergies: Latex and Trileptal [oxcarbazepine]  Current Medications:  Current Outpatient Medications:  .  amphetamine-dextroamphetamine (ADDERALL) 10 MG tablet, 1 po at breakfast, 1 po at lunch, Disp: 60 tablet, Rfl: 0 .  brexpiprazole (REXULTI) 2 MG TABS tablet, Take 1 tablet (2 mg total) by mouth daily., Disp: 30 tablet, Rfl: 5 .  etonogestrel-ethinyl estradiol (NUVARING) 0.12-0.015 MG/24HR vaginal ring, Insert vaginally and leave in place for 3 consecutive weeks, then remove for 1 week., Disp: 3 each, Rfl: 4 .  clonazePAM (KLONOPIN) 1 MG tablet, TAKE 1 TABLET (1 MG TOTAL) BY MOUTH 2 (TWO) TIMES DAILY AS NEEDED FOR ANXIETY. (Patient not taking: Reported on 12/02/2018), Disp: 45 tablet, Rfl: 1 .  fluticasone (FLONASE) 50 MCG/ACT nasal spray, Place 2 sprays into both nostrils daily for 10 days., Disp: 16 g, Rfl: 0 .  nitrofurantoin, macrocrystal-monohydrate, (MACROBID) 100 MG capsule, Take 1 capsule (100 mg total) by mouth 2 (two) times daily. (Patient not taking: Reported on 07/08/2018), Disp: 14 capsule, Rfl: 0 .  phenazopyridine (PYRIDIUM) 100 MG tablet, Take 1 tablet (  100 mg total) by mouth 3 (three) times daily as needed for pain. (Patient not taking: Reported on 07/08/2018), Disp: 10 tablet, Rfl: 0 Medication Side Effects: none  Family Medical/ Social History: Changes? No  MENTAL HEALTH EXAM:  Blood pressure (!) 113/98, pulse 99.There is no height or weight on file to calculate BMI.  General Appearance: Casual, Neat, Well Groomed and Obese  Eye Contact:  Good  Speech:  Clear and Coherent  Volume:  Normal  Mood:   Euthymic  Affect:  Appropriate  Thought Process:  Goal Directed and Descriptions of Associations: Intact  Orientation:  Full (Time, Place, and Person)  Thought Content: Logical   Suicidal Thoughts:  No  Homicidal Thoughts:  No  Memory:  WNL  Judgement:  Good  Insight:  Good  Psychomotor Activity:  Normal  Concentration:  Concentration: Fair and Attention Span: Good  Recall:  Good  Fund of Knowledge: Good  Language: Good  Assets:  Desire for Improvement  ADL's:  Intact  Cognition: WNL  Prognosis:  Good    DIAGNOSES:    ICD-10-CM   1. Bipolar I disorder ([facility])  F31.9   2. 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 She'll call on Dec.7th, let me know how the 15 mg bid is working.  If it's good, will send in new Rx.  (Okay to fill early because of the increase in dose.) If not, change to Adderall XR 15 mg  In the morning, and Adderall IR 15 mg in the afternoon.  Continue Rexulti 2 mg p.o. daily.   Continue Klonopin 1 mg, 1/2-1 twice daily as needed for emergencies only. Return in 6 weeks.  Melony Overly, PA-C

## 2019-02-08 ENCOUNTER — Other Ambulatory Visit: Payer: Self-pay

## 2019-02-08 ENCOUNTER — Telehealth: Payer: Self-pay | Admitting: Physician Assistant

## 2019-02-08 MED ORDER — AMPHETAMINE-DEXTROAMPHETAMINE 10 MG PO TABS: ORAL_TABLET | ORAL | 0 refills | Status: DC

## 2019-02-08 NOTE — Telephone Encounter (Signed)
Does like the dose of Adderall 15mg  taking twice a day. Can you please send in an RX for this new dose to pharmacy on file.

## 2019-02-08 NOTE — Telephone Encounter (Signed)
Last refill 01/18/2019 Pended for approval to fill on 02/15/2019 Has apt 03/11/2019

## 2019-02-10 ENCOUNTER — Telehealth: Payer: Self-pay

## 2019-02-10 ENCOUNTER — Other Ambulatory Visit: Payer: Self-pay

## 2019-02-10 NOTE — Telephone Encounter (Signed)
Please send Adderall RX from 12/7 to [facility] not [facility].

## 2019-02-10 NOTE — Telephone Encounter (Signed)
Last refill 01/18/2019, will pend for approval with appropriate date.

## 2019-02-11 ENCOUNTER — Other Ambulatory Visit: Payer: Self-pay | Admitting: Physician Assistant

## 2019-02-11 MED ORDER — AMPHETAMINE-DEXTROAMPHETAMINE 10 MG PO TABS: ORAL_TABLET | ORAL | 0 refills | Status: DC

## 2019-02-11 MED ORDER — AMPHETAMINE-DEXTROAMPHETAMINE 15 MG PO TABS: 15.0000 mg | ORAL_TABLET | Freq: Two times a day (BID) | ORAL | 0 refills | Status: DC

## 2019-02-11 MED FILL — AMPHETAMINE-DEXTROAMPHETAMI: 15 | 30 days supply | Qty: 60 | Fill #0

## 2019-02-22 MED FILL — REXULTI 2 MG TABLET: 2 | 30 days supply | Qty: 30 | Fill #1

## 2019-03-10 ENCOUNTER — Ambulatory Visit (INDEPENDENT_AMBULATORY_CARE_PROVIDER_SITE_OTHER): Payer: PRIVATE HEALTH INSURANCE | Admitting: Physician Assistant

## 2019-03-10 ENCOUNTER — Other Ambulatory Visit: Payer: Self-pay

## 2019-03-10 ENCOUNTER — Encounter: Payer: Self-pay | Admitting: Physician Assistant

## 2019-03-10 VITALS — BP 128/84 | HR 88

## 2019-03-10 DIAGNOSIS — F988 Other specified behavioral and emotional disorders with onset usually occurring in childhood and adolescence: Secondary | ICD-10-CM

## 2019-03-10 DIAGNOSIS — F3181 Bipolar II disorder: Secondary | ICD-10-CM

## 2019-03-10 DIAGNOSIS — F518 Other sleep disorders not due to a substance or known physiological condition: Secondary | ICD-10-CM

## 2019-03-10 DIAGNOSIS — G479 Sleep disorder, unspecified: Secondary | ICD-10-CM | POA: Diagnosis not present

## 2019-03-10 MED ORDER — REXULTI 3 MG PO TABS: 3.0000 mg | ORAL_TABLET | Freq: Every day | ORAL | 1 refills | Status: DC

## 2019-03-10 MED ORDER — AMPHETAMINE-DEXTROAMPHETAMINE 15 MG PO TABS: 15.0000 mg | ORAL_TABLET | Freq: Two times a day (BID) | ORAL | 0 refills | Status: DC

## 2019-03-10 NOTE — Progress Notes (Signed)
[facility]  Patient ID: Tara Sullivan,  MRN: [id number]  PCP: Mancel Bale, PA-C  Date of Evaluation: 03/10/2019 Time spent:25 minutes  Chief Complaint:  Chief Complaint    Depression; Follow-up      HISTORY/CURRENT STATUS: HPI For routine med check.  Since Christmas, has been more sad. Was late for work this morning b/c she didn't want to get out of bed.  Her holidays were not that fine.  But also she has noticed that around this time of the year when it is the darkest that she gets more depressed.  She feels that she needs a day off to rest.  She denies suicidal or homicidal thoughts.  Another issue is that she is having a lot of weird dreams over the past week or so.  She has never had anything like this before.  Not described as night terrors.  They just wake her up a lot and she does not feel like she is getting adequate rest.  The Adderall usually helps her get going every morning and helps her to focus and complete tasks.  She has been doing better on the higher dose and taking it twice daily.  It is not keeping her awake.  Patient denies increased energy with decreased need for sleep, no increased talkativeness, no racing thoughts, no impulsivity or risky behaviors, no increased spending, no increased libido, no grandiosity.  Denies dizziness, syncope, seizures, numbness, tingling, tremor, tics, unsteady gait, slurred speech, confusion. Denies muscle or joint pain, stiffness, or dystonia.  Individual Medical History/ Review of Systems: Changes? :No   Allergies: Latex and Trileptal [oxcarbazepine]  Current Medications:  Current Outpatient Medications:  .  amphetamine-dextroamphetamine (ADDERALL) 15 MG tablet, Take 1 tablet by mouth 2 (two) times daily., Disp: 60 tablet, Rfl: 0 .  etonogestrel-ethinyl estradiol (NUVARING) 0.12-0.015 MG/24HR vaginal ring, Insert vaginally and leave in place for 3 consecutive weeks, then remove for 1 week., Disp: 3 each, Rfl:  4 .  [START ON 03/12/2019] amphetamine-dextroamphetamine (ADDERALL) 15 MG tablet, Take 1 tablet by mouth 2 (two) times daily., Disp: 60 tablet, Rfl: 0 .  Brexpiprazole (REXULTI) 3 MG TABS, Take 1 tablet (3 mg total) by mouth daily., Disp: 30 tablet, Rfl: 1 .  clonazePAM (KLONOPIN) 1 MG tablet, TAKE 1 TABLET (1 MG TOTAL) BY MOUTH 2 (TWO) TIMES DAILY AS NEEDED FOR ANXIETY. (Patient not taking: Reported on 12/02/2018), Disp: 45 tablet, Rfl: 1 .  fluticasone (FLONASE) 50 MCG/ACT nasal spray, Place 2 sprays into both nostrils daily for 10 days., Disp: 16 g, Rfl: 0 .  nitrofurantoin, macrocrystal-monohydrate, (MACROBID) 100 MG capsule, Take 1 capsule (100 mg total) by mouth 2 (two) times daily. (Patient not taking: Reported on 07/08/2018), Disp: 14 capsule, Rfl: 0 .  phenazopyridine (PYRIDIUM) 100 MG tablet, Take 1 tablet (100 mg total) by mouth 3 (three) times daily as needed for pain. (Patient not taking: Reported on 07/08/2018), Disp: 10 tablet, Rfl: 0 Medication Side Effects: none  Family Medical/ Social History: Changes? No  MENTAL HEALTH EXAM:  Blood pressure 128/84, pulse 88.There is no height or weight on file to calculate BMI.  General Appearance: Casual, Neat, Well Groomed and Obese  Eye Contact:  Good  Speech:  Clear and Coherent  Volume:  Normal  Mood:  Depressed  Affect:  Depressed  Thought Process:  Goal Directed and Descriptions of Associations: Intact  Orientation:  Full (Time, Place, and Person)  Thought Content: Logical   Suicidal Thoughts:  No  Homicidal Thoughts:  No  Memory:  WNL  Judgement:  Good  Insight:  Good  Psychomotor Activity:  Normal  Concentration:  Concentration: Good and Attention Span: Good  Recall:  Good  Fund of Knowledge: Good  Language: Good  Assets:  Desire for Improvement  ADL's:  Intact  Cognition: WNL  Prognosis:  Good    DIAGNOSES:    ICD-10-CM   1. Bipolar 2 disorder, major depressive episode ([facility])  F31.81   2. ADD (attention deficit disorder)  without hyperactivity  F98.8   3. Sleep disturbance  G47.9   4. Abnormal dreams  F51.8     Receiving Psychotherapy: No    RECOMMENDATIONS:  We discussed the dreams.  Consider prazosin or doxazosin but since they just started, I do not think it is necessary to add 1 of those.  She will call if they continue over the next couple of weeks and we can add that in. Continue Adderall 15 mg 1 twice daily.  We may need to increase that but we will leave it the same for now. Start melatonin as needed. Increase Rexulti to 3 mg. Out of work tomorrow and may return on 03/12/19. Return in 4 weeks.  Melony Overly, PA-C

## 2019-03-11 ENCOUNTER — Telehealth: Payer: Self-pay | Admitting: Physician Assistant

## 2019-03-11 NOTE — Telephone Encounter (Signed)
Pharmacy called stating Rexulti 3mg  is not available.They have access to some 2mg . Insurance would need a prior authorization. Please advise on how to proceed.

## 2019-03-12 MED FILL — AMPHETAMINE-DEXTROAMPHETAMI: 15 | 30 days supply | Qty: 60 | Fill #0

## 2019-03-12 NOTE — Telephone Encounter (Signed)
Left patient a voicemail to call back and ask to speak with [name], we do have 15 day vouchers she can use at any pharmacy. [name] will also see what she has at home and we can get samples accordingly.

## 2019-03-12 NOTE — Telephone Encounter (Signed)
I don't think I gave her samples but can't remember. Can we do a 1mg  with her 2mg , or is that not going to fly w/ insurance?

## 2019-03-12 NOTE — Telephone Encounter (Signed)
Let's not worry about the 2 PA.  Can you call Selena Batten or Jonny Ruiz (I think they're the reps, right?) or ask [name] to, to see if they can get [place] 3 mg for a month? Or 1 mg to add to the 2mg  or something?

## 2019-03-15 MED FILL — REXULTI 3 MG TABLET: 3 | 30 days supply | Qty: 30 | Fill #0

## 2019-03-15 NOTE — Telephone Encounter (Signed)
Pharmacy called and said that they have the 3 mg in stock now of the rxulti and are filling it for her

## 2019-03-16 NOTE — Telephone Encounter (Signed)
Noted  

## 2019-04-08 ENCOUNTER — Ambulatory Visit (INDEPENDENT_AMBULATORY_CARE_PROVIDER_SITE_OTHER): Payer: PRIVATE HEALTH INSURANCE | Admitting: Physician Assistant

## 2019-04-08 ENCOUNTER — Other Ambulatory Visit: Payer: Self-pay

## 2019-04-08 ENCOUNTER — Encounter: Payer: Self-pay | Admitting: Physician Assistant

## 2019-04-08 DIAGNOSIS — F988 Other specified behavioral and emotional disorders with onset usually occurring in childhood and adolescence: Secondary | ICD-10-CM

## 2019-04-08 DIAGNOSIS — R5383 Other fatigue: Secondary | ICD-10-CM

## 2019-04-08 DIAGNOSIS — F32A Depression, unspecified: Secondary | ICD-10-CM

## 2019-04-08 DIAGNOSIS — F3162 Bipolar disorder, current episode mixed, moderate: Secondary | ICD-10-CM | POA: Diagnosis not present

## 2019-04-08 DIAGNOSIS — F329 Major depressive disorder, single episode, unspecified: Secondary | ICD-10-CM | POA: Diagnosis not present

## 2019-04-08 MED ORDER — AMPHETAMINE-DEXTROAMPHETAMINE 20 MG PO TABS: 20.0000 mg | ORAL_TABLET | Freq: Two times a day (BID) | ORAL | 0 refills | Status: DC

## 2019-04-08 MED ORDER — BUPROPION HCL ER (XL) 150 MG PO TB24: 150.0000 mg | ORAL_TABLET | ORAL | 1 refills | Status: DC

## 2019-04-08 MED FILL — buPROPion HCL ER (XL) 150 M: 150 | 30 days supply | Qty: 30 | Fill #0

## 2019-04-08 NOTE — Progress Notes (Signed)
[facility]  Patient ID: Tara Sullivan,  MRN: [id number]  PCP: Mancel Bale, PA-C  Date of Evaluation: 04/08/2019 Time spent:30 minutes  Chief Complaint:  Chief Complaint    Depression      HISTORY/CURRENT STATUS: HPI For routine med check.  Called out of work one day last week because she could not muster up the energy to go in.  But her coworkers "blew my phone up and got me to go in.  I like my job but get tired of it.  I just do not want to work.  I have a hard time getting up and going in and then when I get to work I have a hard time working."   She has been having more trouble focusing at work on weekdays and on the weekends.  Feels like the Adderall is not lasting long enough.  She does not cry easily.  Denies suicidal or homicidal thoughts.  States she is fine on the weekends.  She has energy and motivation and feels good.  She tries to go visit a friend or do something else fun.  Does not have a problem getting up out of bed to do things on weekends.  She has taken Wellbutrin in the past but does not really remember it.  She had reported insomnia and that was the reason it was stopped, according to her chart.  She does not remember it.  Hasn't taken the klonopin in a long time. "It's not that I don't get anxious, but just not as anxious."    At the LOV, we increased the [place].  States she can tell it's helping. Patient denies increased energy with decreased need for sleep, no increased talkativeness, no racing thoughts, no impulsivity or risky behaviors, no increased spending, no increased libido, no grandiosity.  No longer having nightmares. Sleeps about 6 hours/night.  Wakes up several times, but is able to go back to sleep. Usually feels 'ok' when she wakes up.  She just isn't in the mood to get up.  Denies dizziness, syncope, no h/o seizures, numbness, tingling, tremor, tics, unsteady gait, slurred speech, confusion. Denies muscle or joint pain,  stiffness, or dystonia. No h/o eating d/o.  Individual Medical History/ Review of Systems: Changes? :No    Past medications for mental health diagnoses include: Zoloft, Seroquel, lithium, Latuda caused stomach pain, Wellbutrin, BuSpar, Pristiq, Klonopin, Prazosin x 1 night, then no longer needed, Strattera, Trileptal caused nausea, Lamictal didn't help.  Allergies: Latex and Trileptal [oxcarbazepine]  Current Medications:  Current Outpatient Medications:  .  Brexpiprazole (REXULTI) 3 MG TABS, Take 1 tablet (3 mg total) by mouth daily., Disp: 30 tablet, Rfl: 1 .  etonogestrel-ethinyl estradiol (NUVARING) 0.12-0.015 MG/24HR vaginal ring, Insert vaginally and leave in place for 3 consecutive weeks, then remove for 1 week., Disp: 3 each, Rfl: 4 .  amphetamine-dextroamphetamine (ADDERALL) 20 MG tablet, Take 1 tablet (20 mg total) by mouth 2 (two) times daily., Disp: 60 tablet, Rfl: 0 .  buPROPion (WELLBUTRIN XL) 150 MG 24 hr tablet, Take 1 tablet (150 mg total) by mouth every morning., Disp: 30 tablet, Rfl: 1 .  fluticasone (FLONASE) 50 MCG/ACT nasal spray, Place 2 sprays into both nostrils daily for 10 days., Disp: 16 g, Rfl: 0 .  nitrofurantoin, macrocrystal-monohydrate, (MACROBID) 100 MG capsule, Take 1 capsule (100 mg total) by mouth 2 (two) times daily. (Patient not taking: Reported on 07/08/2018), Disp: 14 capsule, Rfl: 0 .  phenazopyridine (PYRIDIUM) 100 MG tablet, Take 1  tablet (100 mg total) by mouth 3 (three) times daily as needed for pain. (Patient not taking: Reported on 07/08/2018), Disp: 10 tablet, Rfl: 0 Medication Side Effects: none  Family Medical/ Social History: Changes? No  MENTAL HEALTH EXAM:  There were no vitals taken for this visit.There is no height or weight on file to calculate BMI.  General Appearance: Casual, Neat, Well Groomed and Obese  Eye Contact:  Good  Speech:  Clear and Coherent  Volume:  Normal  Mood:  Euthymic  Affect:  Appropriate  Thought Process:  Goal  Directed  Orientation:  Full (Time, Place, and Person)  Thought Content: Logical   Suicidal Thoughts:  No  Homicidal Thoughts:  No  Memory:  WNL  Judgement:  Good  Insight:  Good  Psychomotor Activity:  Normal  Concentration:  Concentration: Fair and Attention Span: Fair  Recall:  Good  Fund of Knowledge: Good  Language: Good  Assets:  Desire for Improvement  ADL's:  Intact  Cognition: WNL  Prognosis:  Good    DIAGNOSES:    ICD-10-CM   1. 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 Bipolar 1 disorder, mixed, moderate (HCC)  F31.62   3. Fatigue due to depression  F32.9    R53.83     Receiving Psychotherapy: Yes  Kerri Perches   RECOMMENDATIONS:  PDMP reviewed. I spent 30 minutes w/ her.  Increase Adderall to 20 mg bid. Start Wellbutrin XL 150 mg p.o. every morning.  We discussed benefits, risks, side effects and she accepts. Continue Rexulti 3 mg daily. Will discontinue Klonopin because she no longer needs it. She has recently started back seeing Kerri Perches, and therapy.  I have asked her to discuss her feelings about work and lack of interest, decreased motivation and energy when it comes to her job.  These medication changes will help some but some of the problem is behavioral as well.  Patient has an appointment later today with her and will bring that up. Return in 4 weeks.   Melony Overly, PA-C

## 2019-04-09 MED FILL — AMPHETAMINE-DEXTROAMPHETAMI: 20 | 30 days supply | Qty: 60 | Fill #0

## 2019-04-14 MED FILL — REXULTI 3 MG TABLET: 3 | 30 days supply | Qty: 30 | Fill #1

## 2019-05-07 ENCOUNTER — Other Ambulatory Visit: Payer: Self-pay

## 2019-05-07 ENCOUNTER — Ambulatory Visit (INDEPENDENT_AMBULATORY_CARE_PROVIDER_SITE_OTHER): Payer: PRIVATE HEALTH INSURANCE | Admitting: Physician Assistant

## 2019-05-07 ENCOUNTER — Encounter: Payer: Self-pay | Admitting: Physician Assistant

## 2019-05-07 DIAGNOSIS — F319 Bipolar disorder, unspecified: Secondary | ICD-10-CM

## 2019-05-07 DIAGNOSIS — G479 Sleep disorder, unspecified: Secondary | ICD-10-CM

## 2019-05-07 DIAGNOSIS — F988 Other specified behavioral and emotional disorders with onset usually occurring in childhood and adolescence: Secondary | ICD-10-CM

## 2019-05-07 MED ORDER — REXULTI 3 MG PO TABS: 3.0000 mg | ORAL_TABLET | Freq: Every day | ORAL | 0 refills | Status: DC

## 2019-05-07 MED ORDER — AMPHETAMINE-DEXTROAMPHETAMINE 20 MG PO TABS: 20.0000 mg | ORAL_TABLET | Freq: Two times a day (BID) | ORAL | 0 refills | Status: DC

## 2019-05-07 MED ORDER — BUPROPION HCL ER (XL) 150 MG PO TB24: 150.0000 mg | ORAL_TABLET | ORAL | 0 refills | Status: DC

## 2019-05-07 MED ORDER — MELATONIN 3-10 MG PO TABS: 3.0000 mg | ORAL_TABLET | Freq: Every evening | ORAL | 0 refills | Status: DC | PRN

## 2019-05-07 MED FILL — buPROPion HCL ER (XL) 150 M: 150 | 30 days supply | Qty: 30 | Fill #0

## 2019-05-07 MED FILL — AMPHETAMINE-DEXTROAMPHETAMI: 20 | 30 days supply | Qty: 60 | Fill #0

## 2019-05-07 MED FILL — REXULTI 3 MG TABLET: 3 | 30 days supply | Qty: 30 | Fill #0

## 2019-05-07 NOTE — Progress Notes (Signed)
[facility]  Patient ID: Tara Sullivan,  MRN: [id number]  PCP: Mancel Bale, PA-C  Date of Evaluation: 05/07/2019 Time spent:20 minutes  Chief Complaint:  Chief Complaint    Anxiety; Depression      HISTORY/CURRENT STATUS: HPI For routine med check.  Hard to get up in the mornings.  Feels extremely tired for 15 minutes or so and then when she takes her Adderall, she starts to feel better.  Reports going to bed a bit later than she has been.  She also is on her phone during the evening and close to bedtime.  Drinks caffeine as late as dinnertime.  That is no different than it had been though.  States her mood is a lot better.  The Wellbutrin has helped a lot.  She is able to enjoy things.  Once she gets going in the morning, energy and motivation are good.  Appetite is stable as is her weight.  Does not cry easily.  Not having a lot of anxiety and when she does have it is able to control it.  Denies suicidal or homicidal thoughts.  Denies increased energy with decreased need for sleep.  No impulsivity, risky behavior, increased spending, no grandiosity, no increased irritability, no hallucinations.  Denies dizziness, syncope, seizures, numbness, tingling, tremor, tics, unsteady gait, slurred speech, confusion. Denies muscle or joint pain, stiffness, or dystonia.  Individual Medical History/ Review of Systems: Changes? :No    Past medications for mental health diagnoses include: Zoloft, Seroquel, lithium, Latuda caused stomach pain, Wellbutrin, BuSpar, Pristiq, Klonopin, Prazosin x 1 night, then no longer needed, Strattera, Trileptal caused nausea, Lamictal didn't help.  Allergies: Latex and Trileptal [oxcarbazepine]  Current Medications:  Current Outpatient Medications:  .  amphetamine-dextroamphetamine (ADDERALL) 20 MG tablet, Take 1 tablet (20 mg total) by mouth 2 (two) times daily., Disp: 60 tablet, Rfl: 0 .  Brexpiprazole (REXULTI) 3 MG TABS, Take 1 tablet (3  mg total) by mouth daily., Disp: 90 tablet, Rfl: 0 .  buPROPion (WELLBUTRIN XL) 150 MG 24 hr tablet, Take 1 tablet (150 mg total) by mouth every morning., Disp: 90 tablet, Rfl: 0 .  etonogestrel-ethinyl estradiol (NUVARING) 0.12-0.015 MG/24HR vaginal ring, Insert vaginally and leave in place for 3 consecutive weeks, then remove for 1 week., Disp: 3 each, Rfl: 4 .  [START ON 06/06/2019] amphetamine-dextroamphetamine (ADDERALL) 20 MG tablet, Take 1 tablet (20 mg total) by mouth 2 (two) times daily., Disp: 60 tablet, Rfl: 0 .  [START ON 07/05/2019] amphetamine-dextroamphetamine (ADDERALL) 20 MG tablet, Take 1 tablet (20 mg total) by mouth 2 (two) times daily., Disp: 60 tablet, Rfl: 0 .  fluticasone (FLONASE) 50 MCG/ACT nasal spray, Place 2 sprays into both nostrils daily for 10 days., Disp: 16 g, Rfl: 0 .  Melatonin 3-10 MG TABS, Take 3-10 mg by mouth at bedtime as needed., Disp: 30 tablet, Rfl: 0 .  nitrofurantoin, macrocrystal-monohydrate, (MACROBID) 100 MG capsule, Take 1 capsule (100 mg total) by mouth 2 (two) times daily. (Patient not taking: Reported on 07/08/2018), Disp: 14 capsule, Rfl: 0 .  phenazopyridine (PYRIDIUM) 100 MG tablet, Take 1 tablet (100 mg total) by mouth 3 (three) times daily as needed for pain. (Patient not taking: Reported on 07/08/2018), Disp: 10 tablet, Rfl: 0 Medication Side Effects: none  Family Medical/ Social History: Changes? Yes has a new guy in her life  MENTAL HEALTH EXAM:  There were no vitals taken for this visit.There is no height or weight on file to calculate BMI.  General Appearance: Casual, Neat, Well Groomed and Obese  Eye Contact:  Good  Speech:  Clear and Coherent and Normal Rate  Volume:  Normal  Mood:  Euthymic  Affect:  Appropriate  Thought Process:  Goal Directed and Descriptions of Associations: Intact  Orientation:  Full (Time, Place, and Person)  Thought Content: Logical   Suicidal Thoughts:  No  Homicidal Thoughts:  No  Memory:  WNL  Judgement:   Good  Insight:  Good  Psychomotor Activity:  Normal  Concentration:  Concentration: Good and Attention Span: Good  Recall:  Good  Fund of Knowledge: Good  Language: Good  Assets:  Desire for Improvement  ADL's:  Intact  Cognition: WNL  Prognosis:  Good    DIAGNOSES:    ICD-10-CM   1. Sleep disturbance  G47.9   2. Bipolar I disorder (HCC)  F31.9   3. 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 I spent 20 minutes with her. Concerning her sleep, we discussed sleep hygiene including no caffeine after 2 PM or so.  Also either wear amber-colored blue blocker glasses in the evenings or get off of electronic devices at least 2 hours prior to needing to go to sleep.  And of course do not take the Adderall too late in the afternoon or evening.  Melatonin is an option.  She took that in the past and it was helpful.  Other options for consideration in the future are trazodone, mirtazapine, Ambien, Sonata, Lunesta, and others.  She and I both prefer to avoid medications if at all possible. Start melatonin 3 to 10 mg p.o. nightly as needed sleep. Continue Adderall 20 mg, 1 p.o. every morning and 1 around lunchtime as needed. Continue Rexulti 3 mg daily. Continue Wellbutrin XL 150 mg every morning. Continue counseling with Kerri Perches. Return in 3 months.   Melony Overly, PA-C

## 2019-06-05 MED FILL — buPROPion HCL ER (XL) 150 M: 150 | 30 days supply | Qty: 30 | Fill #1

## 2019-06-08 MED FILL — AMPHETAMINE-DEXTROAMPHETAMI: 20 | 30 days supply | Qty: 60 | Fill #0

## 2019-06-15 MED FILL — REXULTI 3 MG TABLET: 3 | 30 days supply | Qty: 30 | Fill #1

## 2019-07-06 MED FILL — buPROPion HCL ER (XL) 150 M: 150 | 30 days supply | Qty: 30 | Fill #2

## 2019-07-07 MED FILL — AMPHETAMINE-DEXTROAMPHETAMI: 20 | 30 days supply | Qty: 60 | Fill #0

## 2019-07-21 ENCOUNTER — Other Ambulatory Visit: Payer: Self-pay

## 2019-07-22 ENCOUNTER — Ambulatory Visit (INDEPENDENT_AMBULATORY_CARE_PROVIDER_SITE_OTHER): Payer: PRIVATE HEALTH INSURANCE | Admitting: Nurse Practitioner

## 2019-07-22 ENCOUNTER — Telehealth: Payer: Self-pay | Admitting: *Deleted

## 2019-07-22 ENCOUNTER — Other Ambulatory Visit: Payer: Self-pay | Admitting: Nurse Practitioner

## 2019-07-22 ENCOUNTER — Encounter: Payer: Self-pay | Admitting: Nurse Practitioner

## 2019-07-22 VITALS — BP 120/80 | Ht 67.0 in | Wt 268.0 lb

## 2019-07-22 DIAGNOSIS — N926 Irregular menstruation, unspecified: Secondary | ICD-10-CM

## 2019-07-22 DIAGNOSIS — E669 Obesity, unspecified: Secondary | ICD-10-CM

## 2019-07-22 DIAGNOSIS — Z8619 Personal history of other infectious and parasitic diseases: Secondary | ICD-10-CM

## 2019-07-22 DIAGNOSIS — Z01419 Encounter for gynecological examination (general) (routine) without abnormal findings: Secondary | ICD-10-CM

## 2019-07-22 DIAGNOSIS — Z113 Encounter for screening for infections with a predominantly sexual mode of transmission: Secondary | ICD-10-CM

## 2019-07-22 DIAGNOSIS — Z3044 Encounter for surveillance of vaginal ring hormonal contraceptive device: Secondary | ICD-10-CM

## 2019-07-22 MED ORDER — ETONOGESTREL-ETHINYL ESTRADIOL 0.12-0.015 MG/24HR VA RING: VAGINAL_RING | VAGINAL | 4 refills | Status: DC

## 2019-07-22 MED FILL — ETONOGESTREL-ETHINYL ESTRAD: 0.12-0.015 | 84 days supply | Qty: 3 | Fill #0

## 2019-07-22 NOTE — Patient Instructions (Signed)
Health Maintenance, Female Adopting a healthy lifestyle and getting preventive care are important in promoting health and wellness. Ask your health care provider about:  The right schedule for you to have regular tests and exams.  Things you can do on your own to prevent diseases and keep yourself healthy. What should I know about diet, weight, and exercise? Eat a healthy diet   Eat a diet that includes plenty of vegetables, fruits, low-fat dairy products, and lean protein.  Do not eat a lot of foods that are high in solid fats, added sugars, or sodium. Maintain a healthy weight Body mass index (BMI) is used to identify weight problems. It estimates body fat based on height and weight. Your health care provider can help determine your BMI and help you achieve or maintain a healthy weight. Get regular exercise Get regular exercise. This is one of the most important things you can do for your health. Most adults should:  Exercise for at least 150 minutes each week. The exercise should increase your heart rate and make you sweat (moderate-intensity exercise).  Do strengthening exercises at least twice a week. This is in addition to the moderate-intensity exercise.  Spend less time sitting. Even light physical activity can be beneficial. Watch cholesterol and blood lipids Have your blood tested for lipids and cholesterol at 31 years of age, then have this test every 5 years. Have your cholesterol levels checked more often if:  Your lipid or cholesterol levels are high.  You are older than 31 years of age.  You are at high risk for heart disease. What should I know about cancer screening? Depending on your health history and family history, you may need to have cancer screening at various ages. This may include screening for:  Breast cancer.  Cervical cancer.  Colorectal cancer.  Skin cancer.  Lung cancer. What should I know about heart disease, diabetes, and high blood  pressure? Blood pressure and heart disease  High blood pressure causes heart disease and increases the risk of stroke. This is more likely to develop in people who have high blood pressure readings, are of African descent, or are overweight.  Have your blood pressure checked: ? Every 3-5 years if you are 18-39 years of age. ? Every year if you are 40 years old or older. Diabetes Have regular diabetes screenings. This checks your fasting blood sugar level. Have the screening done:  Once every three years after age 40 if you are at a normal weight and have a low risk for diabetes.  More often and at a younger age if you are overweight or have a high risk for diabetes. What should I know about preventing infection? Hepatitis B If you have a higher risk for hepatitis B, you should be screened for this virus. Talk with your health care provider to find out if you are at risk for hepatitis B infection. Hepatitis C Testing is recommended for:  Everyone born from 1945 through 1965.  Anyone with known risk factors for hepatitis C. Sexually transmitted infections (STIs)  Get screened for STIs, including gonorrhea and chlamydia, if: ? You are sexually active and are younger than 31 years of age. ? You are older than 31 years of age and your health care provider tells you that you are at risk for this type of infection. ? Your sexual activity has changed since you were last screened, and you are at increased risk for chlamydia or gonorrhea. Ask your health care provider if   you are at risk.  Ask your health care provider about whether you are at high risk for HIV. Your health care provider may recommend a prescription medicine to help prevent HIV infection. If you choose to take medicine to prevent HIV, you should first get tested for HIV. You should then be tested every 3 months for as long as you are taking the medicine. Pregnancy  If you are about to stop having your period (premenopausal) and  you may become pregnant, seek counseling before you get pregnant.  Take 400 to 800 micrograms (mcg) of folic acid every day if you become pregnant.  Ask for birth control (contraception) if you want to prevent pregnancy. Osteoporosis and menopause Osteoporosis is a disease in which the bones lose minerals and strength with aging. This can result in bone fractures. If you are 65 years old or older, or if you are at risk for osteoporosis and fractures, ask your health care provider if you should:  Be screened for bone loss.  Take a calcium or vitamin D supplement to lower your risk of fractures.  Be given hormone replacement therapy (HRT) to treat symptoms of menopause. Follow these instructions at home: Lifestyle  Do not use any products that contain nicotine or tobacco, such as cigarettes, e-cigarettes, and chewing tobacco. If you need help quitting, ask your health care provider.  Do not use street drugs.  Do not share needles.  Ask your health care provider for help if you need support or information about quitting drugs. Alcohol use  Do not drink alcohol if: ? Your health care provider tells you not to drink. ? You are pregnant, may be pregnant, or are planning to become pregnant.  If you drink alcohol: ? Limit how much you use to 0-1 drink a day. ? Limit intake if you are breastfeeding.  Be aware of how much alcohol is in your drink. In the [place] one drink equals one 12 oz bottle of beer (355 mL), one 5 oz glass of wine (148 mL), or one 1 oz glass of hard liquor (44 mL). General instructions  Schedule regular health, dental, and eye exams.  Stay current with your vaccines.  Tell your health care provider if: ? You often feel depressed. ? You have ever been abused or do not feel safe at home. Summary  Adopting a healthy lifestyle and getting preventive care are important in promoting health and wellness.  Follow your health care provider's instructions about healthy  diet, exercising, and getting tested or screened for diseases.  Follow your health care provider's instructions on monitoring your cholesterol and blood pressure. This information is not intended to replace advice given to you by your health care provider. Make sure you discuss any questions you have with your health care provider. Document Revised: 02/11/2018 Document Reviewed: 02/11/2018 Elsevier Patient Education  2020 [employer]  

## 2019-07-22 NOTE — Telephone Encounter (Signed)
Patient was seen today and refill for nuvaring was not sent to pharmacy, Rx sent.

## 2019-07-22 NOTE — Progress Notes (Signed)
   Tara Sullivan [birth year] [id number]   History:  29 y.o. SWF G0 presents for annual exam without GYN complaints. Amenorrhiec on continuous Nuvaring. Gardasil series completed. Normal pap history.  Sexually active, multiple partners.  Requesting STD screen.  History of anxiety/depression, drug and alcohol abuse in the past, being managed by psychiatrist with medications and counseling.  Gynecologic History No LMP recorded. (Menstrual status: Other).   Contraception: NuvaRing vaginal inserts continuous Last Pap:06/01/2018 . Results were: normal   Past medical history, past surgical history, family history and social history were all reviewed and documented in the EPIC chart. 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   ROS:  A ROS was performed and pertinent positives and negatives are included.  Exam:  Vitals:   07/22/19 0803  BP: 120/80  Weight: 268 lb (121.6 kg)  Height: 5\' 7"  (1.702 m)   Body mass index is 41.97 kg/m.  General appearance:  Normal Thyroid:  Symmetrical, normal in size, without palpable masses or nodularity./ Respiratory  Auscultation:  Clear without wheezing or rhonchi Cardiovascular  Auscultation:  Regular rate, without rubs, murmurs or gallops  Edema/varicosities:  Not grossly evident Abdominal  Soft,nontender, without masses, guarding or rebound.  Liver/spleen:  No organomegaly noted  Hernia:  None appreciated  Skin  Inspection:  Grossly normal   Breasts: Examined lying and sitting.   Right: Without masses, retractions, discharge or axillary adenopathy.   Left: Without masses, retractions, discharge or axillary adenopathy. Gentitourinary   Inguinal/mons:  Normal without inguinal adenopathy  External genitalia:  Normal  BUS/Urethra/Skene's glands:  Normal  Vagina:  Normal, Nuvaring palpated  Cervix:  Normal  Uterus:  Normal in size, shape and contour.  Midline and mobile  Adnexa/parametria:     Rt: Without masses or tenderness.   Lt: Without masses or  tenderness.  Anus and perineum: Normal  Assessment/Plan:  29 y.o.  for annual exam.   Well female exam with routine gynecological exam - Plan: CBC with Differential/Platelet, Comprehensive metabolic panel. Education provided on SBEs, importance of preventative screenings, current guidelines, high calcium diet, regular exercise, and multivitamin daily.   Obesity, Class II, BMI 35-39.9 - weight loss management  History of chlamydia -STD prevention and consistent use of condoms recommended  Irregular menses -amenorrheic on continuous NuvaRing  Screen for STD (sexually transmitted disease) - Plan: HIV Antibody (routine testing w rflx), RPR, C. trachomatis/N. gonorrhoeae RNA  Follow-up in 1 year for annual    [address] [facility], 8:23 AM 07/22/2019

## 2019-07-23 LAB — COMPREHENSIVE METABOLIC PANEL
AG Ratio: 1.3 (calc) (ref 1.0–2.5)
ALT: 9 U/L (ref 6–29)
AST: 12 U/L (ref 10–30)
Albumin: 3.5 g/dL — ABNORMAL LOW (ref 3.6–5.1)
Alkaline phosphatase (APISO): 67 U/L (ref 31–125)
BUN: 7 mg/dL (ref 7–25)
CO2: 24 mmol/L (ref 20–32)
Calcium: 9 mg/dL (ref 8.6–10.2)
Chloride: 105 mmol/L (ref 98–110)
Creat: 0.81 mg/dL (ref 0.50–1.10)
Globulin: 2.7 g/dL (calc) (ref 1.9–3.7)
Glucose, Bld: 106 mg/dL — ABNORMAL HIGH (ref 65–99)
Potassium: 4.3 mmol/L (ref 3.5–5.3)
Sodium: 137 mmol/L (ref 135–146)
Total Bilirubin: 0.3 mg/dL (ref 0.2–1.2)
Total Protein: 6.2 g/dL (ref 6.1–8.1)

## 2019-07-23 LAB — HIV ANTIBODY (ROUTINE TESTING W REFLEX): HIV 1&2 Ab, 4th Generation: NONREACTIVE

## 2019-07-23 LAB — CBC WITH DIFFERENTIAL/PLATELET
Absolute Monocytes: 518 cells/uL (ref 200–950)
Basophils Absolute: 53 cells/uL (ref 0–200)
Basophils Relative: 0.7 %
Eosinophils Absolute: 60 cells/uL (ref 15–500)
Eosinophils Relative: 0.8 %
HCT: 38.4 % (ref 35.0–45.0)
Hemoglobin: 12.6 g/dL (ref 11.7–15.5)
Lymphs Abs: 2205 cells/uL (ref 850–3900)
MCH: 28.7 pg (ref 27.0–33.0)
MCHC: 32.8 g/dL (ref 32.0–36.0)
MCV: 87.5 fL (ref 80.0–100.0)
MPV: 10.4 fL (ref 7.5–12.5)
Monocytes Relative: 6.9 %
Neutro Abs: 4665 cells/uL (ref 1500–7800)
Neutrophils Relative %: 62.2 %
Platelets: 287 10*3/uL (ref 140–400)
RBC: 4.39 10*6/uL (ref 3.80–5.10)
RDW: 12.8 % (ref 11.0–15.0)
Total Lymphocyte: 29.4 %
WBC: 7.5 10*3/uL (ref 3.8–10.8)

## 2019-07-23 LAB — C. TRACHOMATIS/N. GONORRHOEAE RNA
C. trachomatis RNA, TMA: NOT DETECTED
N. gonorrhoeae RNA, TMA: NOT DETECTED

## 2019-07-23 LAB — RPR: RPR Ser Ql: NONREACTIVE

## 2019-08-06 ENCOUNTER — Ambulatory Visit (INDEPENDENT_AMBULATORY_CARE_PROVIDER_SITE_OTHER): Payer: PRIVATE HEALTH INSURANCE | Admitting: Physician Assistant

## 2019-08-06 ENCOUNTER — Encounter: Payer: Self-pay | Admitting: Physician Assistant

## 2019-08-06 ENCOUNTER — Other Ambulatory Visit: Payer: Self-pay

## 2019-08-06 VITALS — BP 131/84 | HR 92

## 2019-08-06 DIAGNOSIS — F411 Generalized anxiety disorder: Secondary | ICD-10-CM

## 2019-08-06 DIAGNOSIS — F988 Other specified behavioral and emotional disorders with onset usually occurring in childhood and adolescence: Secondary | ICD-10-CM

## 2019-08-06 DIAGNOSIS — G479 Sleep disorder, unspecified: Secondary | ICD-10-CM | POA: Diagnosis not present

## 2019-08-06 DIAGNOSIS — F319 Bipolar disorder, unspecified: Secondary | ICD-10-CM

## 2019-08-06 DIAGNOSIS — Z566 Other physical and mental strain related to work: Secondary | ICD-10-CM

## 2019-08-06 MED ORDER — AMPHETAMINE-DEXTROAMPHETAMINE 30 MG PO TABS: 30.0000 mg | ORAL_TABLET | Freq: Two times a day (BID) | ORAL | 0 refills | Status: DC

## 2019-08-06 MED ORDER — BUPROPION HCL ER (XL) 150 MG PO TB24: 150.0000 mg | ORAL_TABLET | ORAL | 0 refills | Status: DC

## 2019-08-06 MED ORDER — REXULTI 3 MG PO TABS: 3.0000 mg | ORAL_TABLET | Freq: Every day | ORAL | 0 refills | Status: DC

## 2019-08-06 MED FILL — buPROPion HCL ER (XL) 150 M: 150 | 30 days supply | Qty: 30 | Fill #1

## 2019-08-06 MED FILL — AMPHETAMINE-DEXTROAMPHETAMI: 30 | 30 days supply | Qty: 60 | Fill #0

## 2019-08-06 NOTE — Progress Notes (Signed)
[facility]  Patient ID: Tara Sullivan,  MRN: [id number]  PCP: Morrell Riddle, PA-C  Date of Evaluation: 08/06/2019 Time spent:40 minutes  Chief Complaint:  Chief Complaint    Follow-up      HISTORY/CURRENT STATUS: HPI For routine med check.     Here for routine med check but is having problems feeling extremely tired every morning.  She wakes up a lot during the night and does not feel rested at all when she gets up every morning.  She does snore.  Patient states she has not been using her phone or any electronic devices within a couple of hours before going to sleep, as we discussed at the last visit.  That has not seemed to make any difference.  She has not tried melatonin, but the main problem is not falling to sleep.  No weight gain is reported and in fact she is trying to lose weight.  She has lost 2 pounds in the past few months.  The Adderall is not as effective as it used to be.  It does not seem strong enough or last long enough.  She will get distracted easily at work and has a hard time redirecting her thoughts.  Mood wise, she is doing well.  The Wellbutrin has helped a lot.  Work is very stressful and there are times when she will take a mental health day.  She gets frustrated because some of the people will not do their job at work and she has to do hers and there is to.  She is able to enjoy things.  Once she gets going in the morning, energy and motivation are good.  Does not usually nap during the day even though she is still tired.  Does not cry easily.  Not having a lot of anxiety and when she does have it is able to control it.  Denies suicidal or homicidal thoughts.  Denies increased energy with decreased need for sleep.  No impulsivity, risky behavior, increased spending, no grandiosity, no increased irritability, no hallucinations.  Review of Systems  Constitutional: Positive for malaise/fatigue.  HENT: Negative.   Eyes: Negative.   Respiratory:  Negative.   Cardiovascular: Negative.   Gastrointestinal: Negative.   Genitourinary: Negative.   Musculoskeletal: Negative.   Skin: Negative.   Neurological: Negative.   Endo/Heme/Allergies: Negative.   Psychiatric/Behavioral: The patient has insomnia.        See history noted above.   Individual Medical History/ Review of Systems: Changes? :No    Past medications for mental health diagnoses include: Zoloft, Seroquel, lithium, Latuda caused stomach pain, Wellbutrin, BuSpar, Pristiq, Klonopin, Prazosin x 1 night, then no longer needed, Strattera, Trileptal caused nausea, Lamictal didn't help.  Allergies: Latex and Trileptal [oxcarbazepine]  Current Medications:  Current Outpatient Medications:  .  Brexpiprazole (REXULTI) 3 MG TABS, Take 1 tablet (3 mg total) by mouth daily., Disp: 90 tablet, Rfl: 0 .  buPROPion (WELLBUTRIN XL) 150 MG 24 hr tablet, Take 1 tablet (150 mg total) by mouth every morning., Disp: 90 tablet, Rfl: 0 .  etonogestrel-ethinyl estradiol (NUVARING) 0.12-0.015 MG/24HR vaginal ring, Insert vaginally and leave in place for 3 consecutive weeks, then remove for 1 week., Disp: 3 each, Rfl: 4 .  Melatonin 3-10 MG TABS, Take 3-10 mg by mouth at bedtime as needed., Disp: 30 tablet, Rfl: 0 .  amphetamine-dextroamphetamine (ADDERALL) 30 MG tablet, Take 1 tablet by mouth 2 (two) times daily., Disp: 60 tablet, Rfl: 0 .  fluticasone (FLONASE)  50 MCG/ACT nasal spray, Place 2 sprays into both nostrils daily for 10 days., Disp: 16 g, Rfl: 0 Medication Side Effects: none  Family Medical/ Social History: Changes?  No  MENTAL HEALTH EXAM:  Blood pressure 131/84, pulse 92.There is no height or weight on file to calculate BMI.  General Appearance: Casual, Neat, Well Groomed and Obese  Eye Contact:  Good  Speech:  Clear and Coherent and Normal Rate  Volume:  Normal  Mood:  Euthymic  Affect:  Appropriate  Thought Process:  Goal Directed and Descriptions of Associations: Intact   Orientation:  Full (Time, Place, and Person)  Thought Content: Logical   Suicidal Thoughts:  No  Homicidal Thoughts:  No  Memory:  WNL  Judgement:  Good  Insight:  Good  Psychomotor Activity:  Normal  Concentration:  Concentration: Fair and Attention Span: Fair  Recall:  Good  Fund of Knowledge: Good  Language: Good  Assets:  Desire for Improvement  ADL's:  Intact  Cognition: WNL  Prognosis:  Good    DIAGNOSES:    ICD-10-CM   1. Sleep disturbance  G47.9   2. Bipolar I disorder ([place])  F31.9   3. 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 Generalized anxiety disorder  F41.1   5. Work stress  Z56.6     Receiving Psychotherapy: Yes  Jacqulyn Bath   RECOMMENDATIONS:  PDMP was reviewed. I spent 40 minutes with her. We discussed the lack of restorative sleep.  We need to rule out sleep apnea.  Of course she needs to continue working on weight loss.  I recommend a sleep study, either in-home or at a sleep center.  After discussing that, she would like to proceed and have an in-home study done.  We will order that to be done as soon as possible and will treat as is appropriate.  In the meantime, continue to stay off of any electronic devices within 2 hours before needing to go to sleep, to help her on melatonin production.  Avoid caffeine after early afternoon. Recommend that we increase Adderall.  She knows not to take the last dose too late or else that will affect her sleep. Increase Adderall to 30 mg, 1 p.o. every morning and 1 around lunchtime. Continue Rexulti 3 mg daily. Continue Wellbutrin XL 150 mg every morning. Continue counseling with Jacqulyn Bath. Return in 4 weeks.  Donnal Moat, PA-C

## 2019-08-08 MED FILL — REXULTI 3 MG TABLET: 3 | 30 days supply | Qty: 30 | Fill #0

## 2019-08-14 IMAGING — DX DG CHEST 2V
2 series · 2 of 2 positions shown · non-contrast
Comparison: None.

CLINICAL DATA: 26 y/o  female with pleuritic chest pain.

EXAM:
CHEST  2 VIEW

[chest pa]
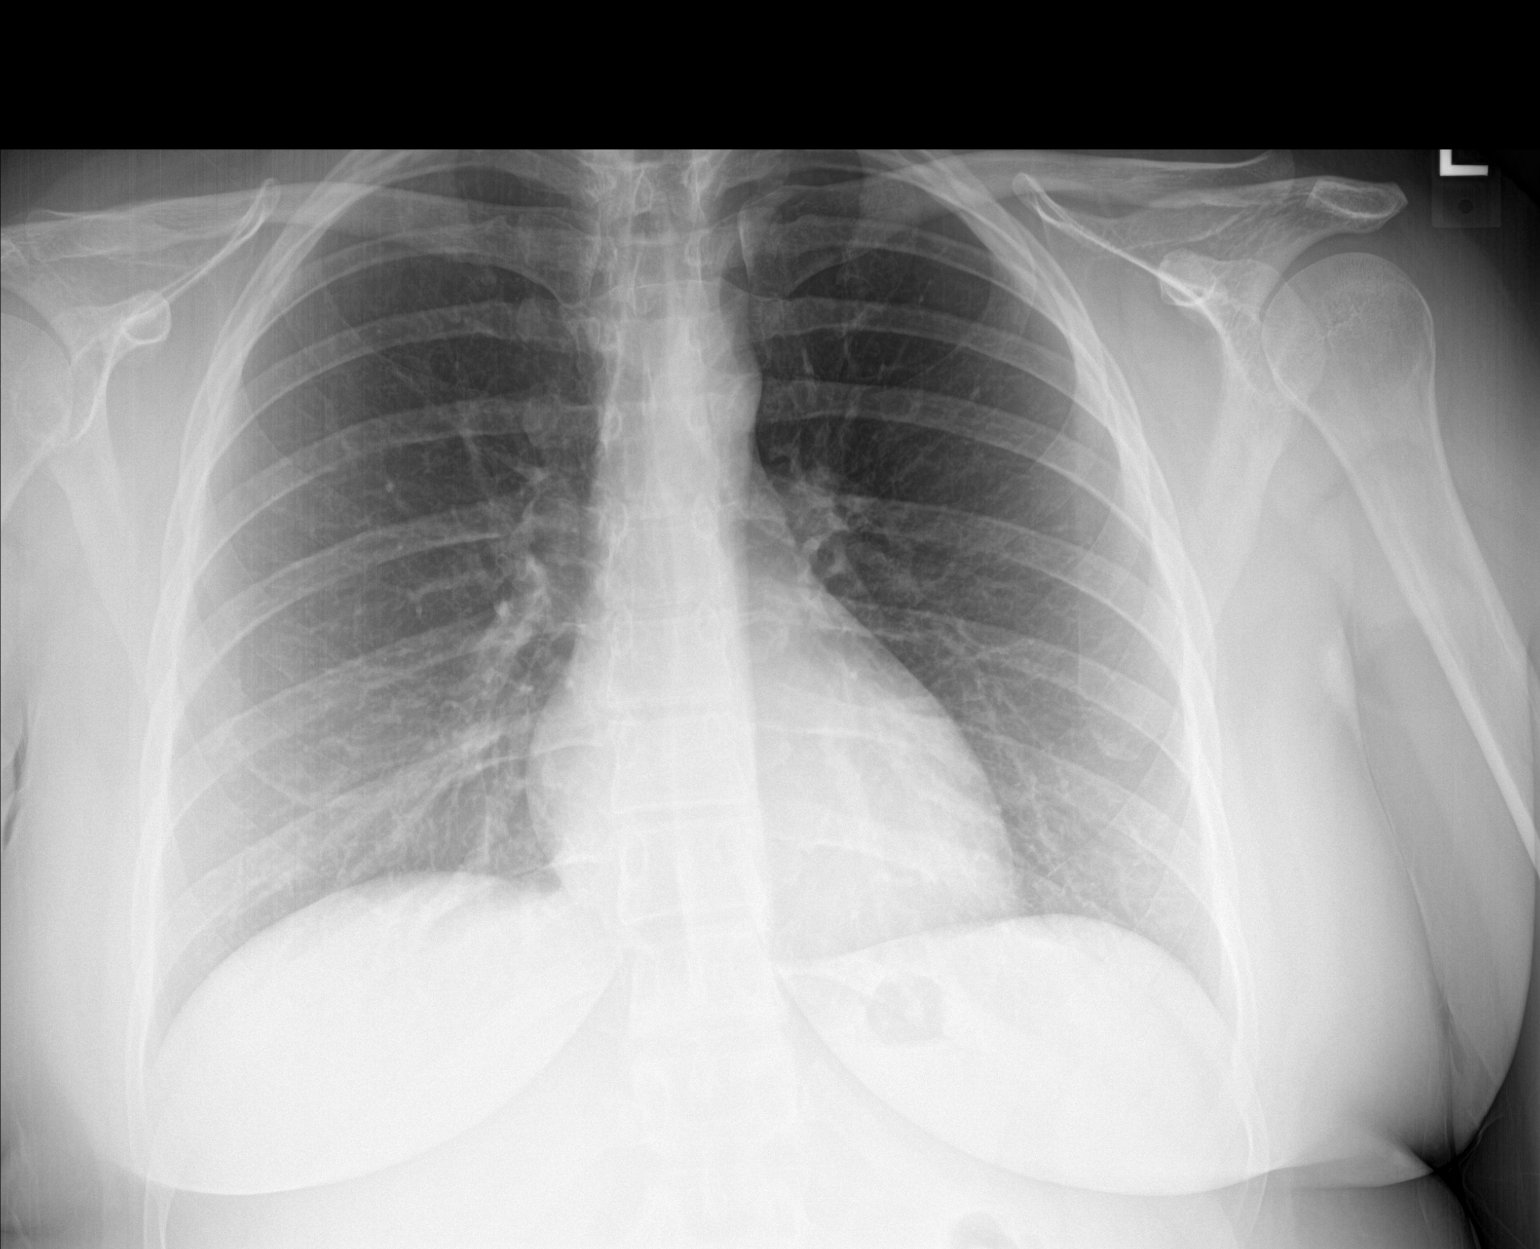

[chest lat]
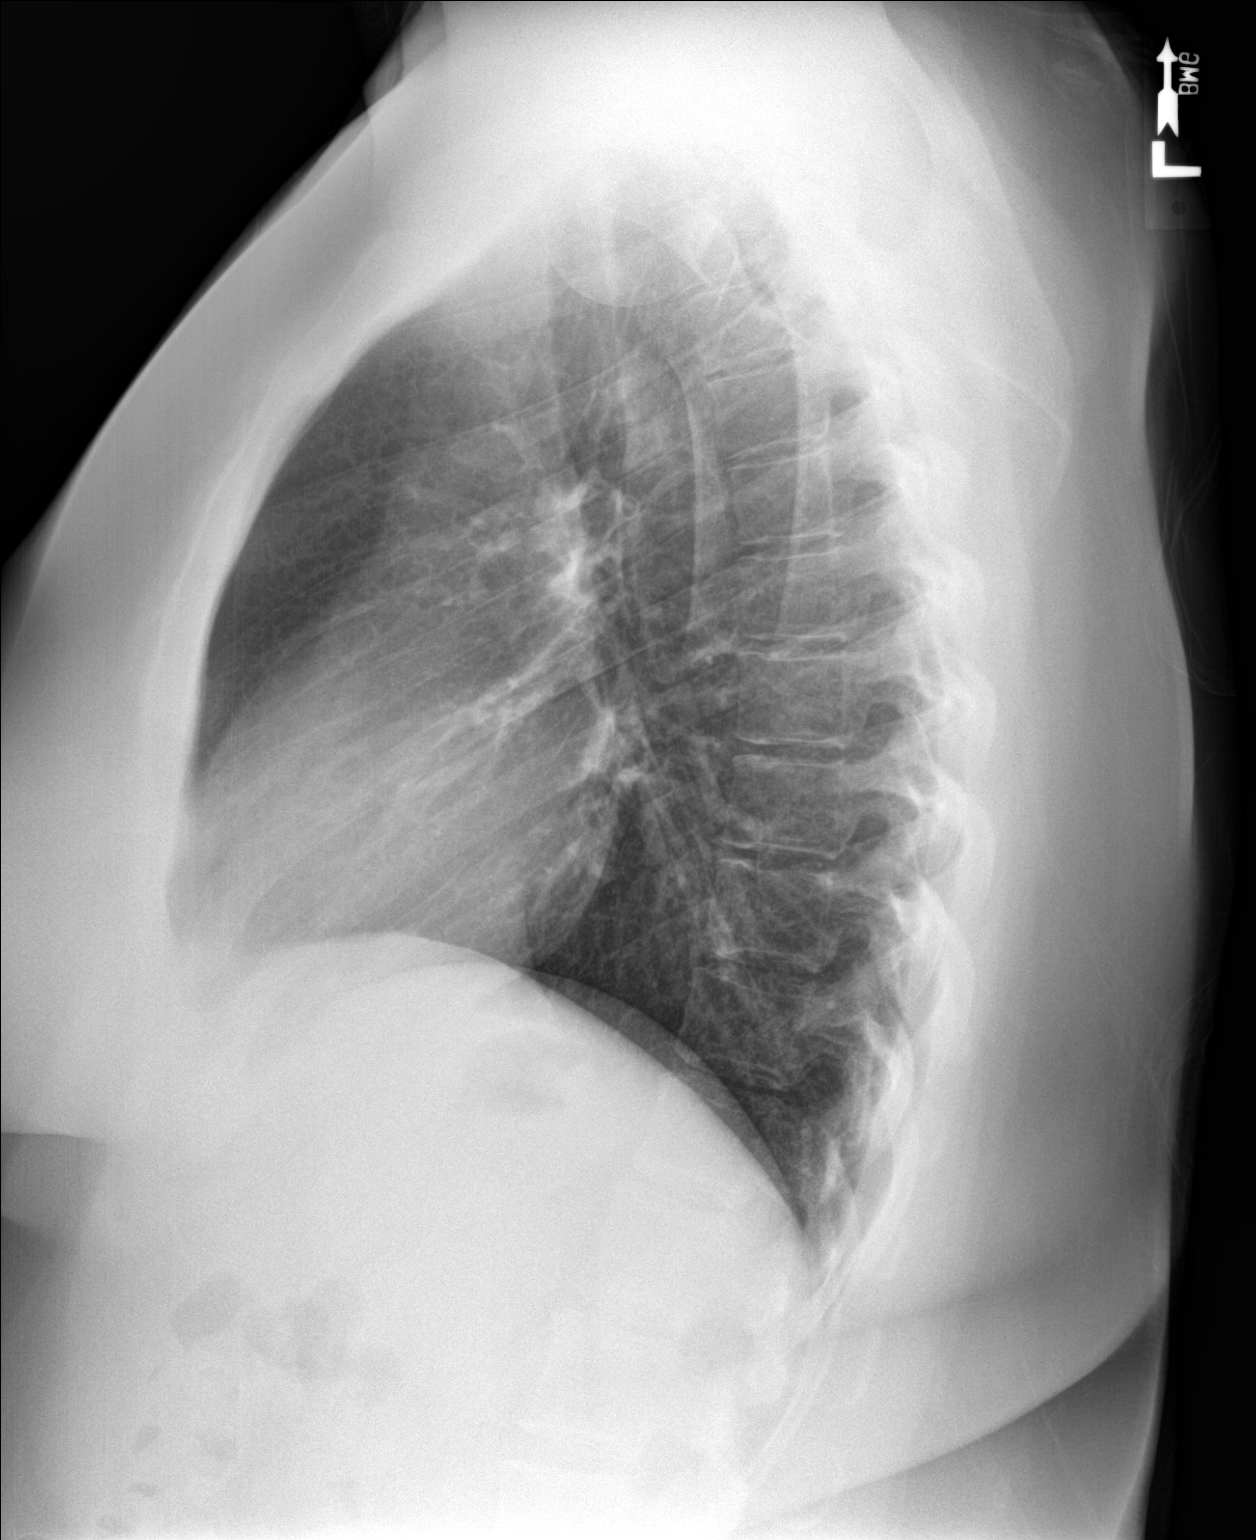

[2 of 2 positions shown; findings below may reference images not displayed]

FINDINGS: Normal cardiac size and mediastinal contours. Visualized tracheal
air column is within normal limits. Lung volumes are within normal
limits. The lungs appear clear. No pneumothorax or pleural effusion.
Negative visible bowel gas pattern. No osseous abnormality
identified.
IMPRESSION: Negative.  No acute cardiopulmonary abnormality.

## 2019-08-21 MED FILL — REXULTI 3 MG TABLET: 3 | 30 days supply | Qty: 30 | Fill #0

## 2019-09-01 MED FILL — buPROPion HCL ER (XL) 150 M: 150 | 30 days supply | Qty: 30 | Fill #0

## 2019-09-08 ENCOUNTER — Other Ambulatory Visit: Payer: Self-pay | Admitting: Physician Assistant

## 2019-09-08 NOTE — Telephone Encounter (Signed)
Apt 07/09 

## 2019-09-09 MED FILL — AMPHETAMINE-DEXTROAMPHETAMI: 30 | 30 days supply | Qty: 60 | Fill #0

## 2019-09-10 ENCOUNTER — Other Ambulatory Visit: Payer: Self-pay | Admitting: Physician Assistant

## 2019-09-10 ENCOUNTER — Other Ambulatory Visit: Payer: Self-pay

## 2019-09-10 ENCOUNTER — Ambulatory Visit (INDEPENDENT_AMBULATORY_CARE_PROVIDER_SITE_OTHER): Payer: PRIVATE HEALTH INSURANCE | Admitting: Physician Assistant

## 2019-09-10 ENCOUNTER — Encounter: Payer: Self-pay | Admitting: Physician Assistant

## 2019-09-10 DIAGNOSIS — F3181 Bipolar II disorder: Secondary | ICD-10-CM

## 2019-09-10 DIAGNOSIS — F988 Other specified behavioral and emotional disorders with onset usually occurring in childhood and adolescence: Secondary | ICD-10-CM

## 2019-09-10 DIAGNOSIS — F411 Generalized anxiety disorder: Secondary | ICD-10-CM

## 2019-09-10 MED ORDER — AMPHETAMINE-DEXTROAMPHETAMINE 30 MG PO TABS: 1.0000 | ORAL_TABLET | Freq: Two times a day (BID) | ORAL | 0 refills | Status: DC

## 2019-09-10 MED ORDER — REXULTI 3 MG PO TABS: 3.0000 mg | ORAL_TABLET | Freq: Every day | ORAL | 2 refills | Status: DC

## 2019-09-10 MED ORDER — AMPHETAMINE-DEXTROAMPHETAMINE 30 MG PO TABS
30.0000 mg | ORAL_TABLET | Freq: Two times a day (BID) | ORAL | 0 refills | Status: DC
Start: 2019-10-10 — End: 2019-12-08

## 2019-09-10 MED ORDER — BUPROPION HCL ER (XL) 150 MG PO TB24: 150.0000 mg | ORAL_TABLET | ORAL | 2 refills | Status: DC

## 2019-09-10 NOTE — Progress Notes (Signed)
[facility]  Patient ID: Tara Sullivan,  MRN: [id number]  PCP: Morrell Riddle, PA-C  Date of Evaluation: 09/10/2019 Time spent:20 minutes  Chief Complaint:  Chief Complaint    Follow-up      HISTORY/CURRENT STATUS: HPI For routine med check.     States she is doing really well.  At the last visit, we increased the Adderall.  That has been very helpful.  She is able to focus and stay on task better now.  Not having much anxiety to speak of.  She is able to enjoy things.  She went and visited them and and [place] last week.  It was nice to get away.  Energy and motivation are good.  Not isolating.  Work is busy and can be very stressful but overall she thinks she is doing well.  Denies suicidal or homicidal thoughts.  Denies increased energy with decreased need for sleep.  No impulsivity, risky behavior, increased spending, no grandiosity, no increased irritability, no hallucinations.  Denies dizziness, syncope, seizures, numbness, tingling, tremor, tics, unsteady gait, slurred speech, confusion. Denies muscle or joint pain, stiffness, or dystonia.  Individual Medical History/ Review of Systems: Changes? :No    Past medications for mental health diagnoses include: Zoloft, Seroquel, lithium, Latuda caused stomach pain, Wellbutrin, BuSpar, Pristiq, Klonopin, Prazosin x 1 night, then no longer needed, Strattera, Trileptal caused nausea, Lamictal didn't help.  Allergies: Latex and Trileptal [oxcarbazepine]  Current Medications:  Current Outpatient Medications:  .  [START ON 11/09/2019] amphetamine-dextroamphetamine (ADDERALL) 30 MG tablet, Take 1 tablet by mouth 2 (two) times daily., Disp: 60 tablet, Rfl: 0 .  Brexpiprazole (REXULTI) 3 MG TABS, Take 1 tablet (3 mg total) by mouth daily., Disp: 30 tablet, Rfl: 2 .  buPROPion (WELLBUTRIN XL) 150 MG 24 hr tablet, Take 1 tablet (150 mg total) by mouth every morning., Disp: 30 tablet, Rfl: 2 .  etonogestrel-ethinyl estradiol  (NUVARING) 0.12-0.015 MG/24HR vaginal ring, Insert vaginally and leave in place for 3 consecutive weeks, then remove for 1 week., Disp: 3 each, Rfl: 4 .  Melatonin 3-10 MG TABS, Take 3-10 mg by mouth at bedtime as needed., Disp: 30 tablet, Rfl: 0 .  [START ON 10/10/2019] amphetamine-dextroamphetamine (ADDERALL) 30 MG tablet, Take 1 tablet by mouth 2 (two) times daily., Disp: 60 tablet, Rfl: 0 .  fluticasone (FLONASE) 50 MCG/ACT nasal spray, Place 2 sprays into both nostrils daily for 10 days., Disp: 16 g, Rfl: 0 Medication Side Effects: none  Family Medical/ Social History: Changes?  No  MENTAL HEALTH EXAM:  There were no vitals taken for this visit.There is no height or weight on file to calculate BMI.  General Appearance: Casual, Neat, Well Groomed and Obese  Eye Contact:  Good  Speech:  Clear and Coherent and Normal Rate  Volume:  Normal  Mood:  Euthymic  Affect:  Appropriate  Thought Process:  Goal Directed and Descriptions of Associations: Intact  Orientation:  Full (Time, Place, and Person)  Thought Content: Logical   Suicidal Thoughts:  No  Homicidal Thoughts:  No  Memory:  WNL  Judgement:  Good  Insight:  Good  Psychomotor Activity:  Normal  Concentration:  Concentration: Good and Attention Span: Good  Recall:  Good  Fund of Knowledge: Good  Language: Good  Assets:  Desire for Improvement  ADL's:  Intact  Cognition: WNL  Prognosis:  Good    DIAGNOSES:    ICD-10-CM   1. 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 Generalized anxiety disorder  F41.1   3. Bipolar 2 disorder, major depressive episode (HCC)  F31.81     Receiving Psychotherapy: Yes  Kerri Perches prn   RECOMMENDATIONS:  PDMP was reviewed. I provided 20 minutes of face-to-face time during this encounter. Continue Adderall 30 mg, 1 p.o. every morning and 1 around lunchtime. Continue Rexulti 3 mg daily. Continue Wellbutrin XL 150 mg every morning. Continue counseling with Kerri Perches prn. Return in 3 months.  Melony Overly, PA-C

## 2019-09-13 MED FILL — REXULTI 3 MG TABLET: 3 | 30 days supply | Qty: 30 | Fill #0

## 2019-09-27 MED FILL — REXULTI 3 MG TABLET: 3 | 30 days supply | Qty: 30 | Fill #1

## 2019-10-06 MED FILL — buPROPion HCL ER (XL) 150 M: 150 | 30 days supply | Qty: 30 | Fill #1

## 2019-10-11 MED FILL — AMPHETAMINE-DEXTROAMPHETAMI: 30 | 30 days supply | Qty: 60 | Fill #0

## 2019-10-25 MED FILL — REXULTI 3 MG TABLET: 3 | 30 days supply | Qty: 30 | Fill #2

## 2019-11-04 MED FILL — buPROPion HCL ER (XL) 150 M: 150 | 30 days supply | Qty: 30 | Fill #2

## 2019-11-09 MED FILL — AMPHETAMINE-DEXTROAMPHETAMI: 30 | 30 days supply | Qty: 60 | Fill #0

## 2019-11-23 MED FILL — REXULTI 3 MG TABLET: 3 | 30 days supply | Qty: 30 | Fill #0

## 2019-11-24 ENCOUNTER — Other Ambulatory Visit: Payer: Self-pay | Admitting: Physician Assistant

## 2019-11-24 ENCOUNTER — Other Ambulatory Visit: Payer: Self-pay

## 2019-11-24 MED ORDER — REXULTI 3 MG PO TABS: 3.0000 mg | ORAL_TABLET | Freq: Every day | ORAL | 0 refills | Status: DC

## 2019-12-06 MED FILL — buPROPion HCL ER (XL) 150 M: 150 | 30 days supply | Qty: 30 | Fill #0

## 2019-12-07 ENCOUNTER — Other Ambulatory Visit: Payer: Self-pay | Admitting: Physician Assistant

## 2019-12-07 ENCOUNTER — Other Ambulatory Visit: Payer: Self-pay

## 2019-12-07 MED ORDER — BUPROPION HCL ER (XL) 150 MG PO TB24: 150.0000 mg | ORAL_TABLET | ORAL | 0 refills | Status: DC

## 2019-12-08 ENCOUNTER — Ambulatory Visit (INDEPENDENT_AMBULATORY_CARE_PROVIDER_SITE_OTHER): Payer: PRIVATE HEALTH INSURANCE | Admitting: Physician Assistant

## 2019-12-08 ENCOUNTER — Other Ambulatory Visit: Payer: Self-pay | Admitting: Physician Assistant

## 2019-12-08 ENCOUNTER — Encounter: Payer: Self-pay | Admitting: Physician Assistant

## 2019-12-08 ENCOUNTER — Other Ambulatory Visit: Payer: Self-pay

## 2019-12-08 VITALS — BP 135/82 | HR 89

## 2019-12-08 DIAGNOSIS — F3176 Bipolar disorder, in full remission, most recent episode depressed: Secondary | ICD-10-CM

## 2019-12-08 DIAGNOSIS — F988 Other specified behavioral and emotional disorders with onset usually occurring in childhood and adolescence: Secondary | ICD-10-CM | POA: Diagnosis not present

## 2019-12-08 MED ORDER — AMPHETAMINE-DEXTROAMPHETAMINE 30 MG PO TABS: 30.0000 mg | ORAL_TABLET | Freq: Two times a day (BID) | ORAL | 0 refills | Status: DC

## 2019-12-08 MED ORDER — AMPHETAMINE-DEXTROAMPHETAMINE 30 MG PO TABS: 1.0000 | ORAL_TABLET | Freq: Two times a day (BID) | ORAL | 0 refills | Status: DC

## 2019-12-08 MED ORDER — AMPHETAMINE-DEXTROAMPHETAMINE 30 MG PO TABS
30.0000 mg | ORAL_TABLET | Freq: Two times a day (BID) | ORAL | 0 refills | Status: DC
Start: 2019-12-08 — End: 2020-03-08

## 2019-12-08 MED FILL — AMPHETAMINE-DEXTROAMPHETAMI: 30 | 30 days supply | Qty: 60 | Fill #0

## 2019-12-08 NOTE — Progress Notes (Signed)
[facility]  Patient ID: Tara Sullivan,  MRN: [id number]  PCP: Morrell Riddle, PA-C  Date of Evaluation: 12/08/2019 Time spent:20 minutes  Chief Complaint:  Chief Complaint    ADD; Depression      HISTORY/CURRENT STATUS: HPI For routine med check.     States she is doing well.  Adderall is working well at this dose.  She is able to concentrate and finish tasks in a timely manner, especially at work.  Not getting distracted as easily as she has in the past.  She is able to enjoy things. Energy and motivation are good.  Not isolating.  Work is busy and can be very stressful but overall she thinks she is doing well.  Has missed 1 day of work in the past several months, due to her mental health.  Even though there are days that she does not want to get up and go to work, she does anyway. She sleeps well.  Denies suicidal or homicidal thoughts.  Denies increased energy with decreased need for sleep.  No impulsivity, risky behavior, increased spending, no grandiosity, no increased irritability, no hallucinations.  Denies dizziness, syncope, seizures, numbness, tingling, tremor, tics, unsteady gait, slurred speech, confusion. Denies muscle or joint pain, stiffness, or dystonia.  Individual Medical History/ Review of Systems: Changes? :No    Past medications for mental health diagnoses include: Zoloft, Seroquel, lithium, Latuda caused stomach pain, Wellbutrin, BuSpar, Pristiq, Klonopin, Prazosin x 1 night, then no longer needed, Strattera, Trileptal caused nausea, Lamictal didn't help.  Allergies: Latex and Trileptal [oxcarbazepine]  Current Medications:  Current Outpatient Medications:  .  [START ON 02/05/2020] amphetamine-dextroamphetamine (ADDERALL) 30 MG tablet, Take 1 tablet by mouth 2 (two) times daily., Disp: 60 tablet, Rfl: 0 .  [START ON 01/08/2020] amphetamine-dextroamphetamine (ADDERALL) 30 MG tablet, Take 1 tablet by mouth 2 (two) times daily., Disp: 60 tablet,  Rfl: 0 .  Brexpiprazole (REXULTI) 3 MG TABS, Take 1 tablet (3 mg total) by mouth daily., Disp: 30 tablet, Rfl: 0 .  buPROPion (WELLBUTRIN XL) 150 MG 24 hr tablet, Take 1 tablet (150 mg total) by mouth every morning., Disp: 30 tablet, Rfl: 0 .  etonogestrel-ethinyl estradiol (NUVARING) 0.12-0.015 MG/24HR vaginal ring, Insert vaginally and leave in place for 3 consecutive weeks, then remove for 1 week., Disp: 3 each, Rfl: 4 .  Melatonin 3-10 MG TABS, Take 3-10 mg by mouth at bedtime as needed., Disp: 30 tablet, Rfl: 0 .  amphetamine-dextroamphetamine (ADDERALL) 30 MG tablet, Take 1 tablet by mouth 2 (two) times daily., Disp: 60 tablet, Rfl: 0 .  fluticasone (FLONASE) 50 MCG/ACT nasal spray, Place 2 sprays into both nostrils daily for 10 days., Disp: 16 g, Rfl: 0 Medication Side Effects: none  Family Medical/ Social History: Changes?  No  MENTAL HEALTH EXAM:  Blood pressure 135/82, pulse 89.There is no height or weight on file to calculate BMI.  General Appearance: Casual, Neat, Well Groomed and Obese  Eye Contact:  Good  Speech:  Clear and Coherent and Normal Rate  Volume:  Normal  Mood:  Euthymic  Affect:  Appropriate  Thought Process:  Goal Directed and Descriptions of Associations: Intact  Orientation:  Full (Time, Place, and Person)  Thought Content: Logical   Suicidal Thoughts:  No  Homicidal Thoughts:  No  Memory:  WNL  Judgement:  Good  Insight:  Good  Psychomotor Activity:  Normal  Concentration:  Concentration: Good and Attention Span: Good  Recall:  Good  Fund of Knowledge: Good  Language: Good  Assets:  Desire for Improvement  ADL's:  Intact  Cognition: WNL  Prognosis:  Good    DIAGNOSES:    ICD-10-CM   1. 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 Bipolar disorder, in full remission, most recent episode depressed (HCC)  F31.76     Receiving Psychotherapy: No  has seen Kerri Perches in the past.    RECOMMENDATIONS:  PDMP was  reviewed. I provided 20 minutes of face-to-face time during this encounter. Continue Adderall 30 mg, 1 p.o. every morning and 1 around lunchtime. Continue Rexulti 3 mg daily. Continue Wellbutrin XL 150 mg every morning. Return to counseling as needed. Return in 3 months.  Melony Overly, PA-C

## 2019-12-15 ENCOUNTER — Telehealth: Payer: Self-pay

## 2019-12-15 NOTE — Telephone Encounter (Signed)
Prior authorization submitted and approved for AMPHETAMINE-DEXTROAMPHETAMINE 30 MG #60 with [employer] ID# [id number] effective 12/15/2019-12/14/2020 PA# [id number]

## 2019-12-23 MED FILL — REXULTI 3 MG TABLET: 3 | 30 days supply | Qty: 30 | Fill #1

## 2019-12-28 ENCOUNTER — Other Ambulatory Visit (HOSPITAL_COMMUNITY): Payer: Self-pay | Admitting: Adult Health

## 2019-12-28 MED FILL — CEPHALEXIN 500 MG CAPSULE: 500 | 7 days supply | Qty: 14 | Fill #0

## 2019-12-28 MED FILL — URO-MP CAPSULE: 118 | 6 days supply | Qty: 20 | Fill #0

## 2020-01-04 MED FILL — buPROPion HCL ER (XL) 150 M: 150 | 30 days supply | Qty: 30 | Fill #1

## 2020-01-11 MED FILL — AMPHETAMINE-DEXTROAMPHETAMI: 30 | 30 days supply | Qty: 60 | Fill #0

## 2020-01-22 MED FILL — REXULTI 3 MG TABLET: 3 | 30 days supply | Qty: 30 | Fill #2

## 2020-02-01 MED FILL — buPROPion HCL ER (XL) 150 M: 150 | 30 days supply | Qty: 30 | Fill #2

## 2020-02-02 MED FILL — ETONOGESTREL-ETHINYL ESTRAD: 0.12-0.015 | 84 days supply | Qty: 3 | Fill #1

## 2020-02-08 MED FILL — AMPHETAMINE-DEXTROAMPHETAMI: 30 | 30 days supply | Qty: 60 | Fill #0

## 2020-02-21 ENCOUNTER — Other Ambulatory Visit: Payer: Self-pay | Admitting: Physician Assistant

## 2020-02-21 MED ORDER — REXULTI 3 MG PO TABS: 3.0000 mg | ORAL_TABLET | Freq: Every day | ORAL | 5 refills | Status: DC

## 2020-02-21 MED FILL — REXULTI 3 MG TABLET: 3 | 30 days supply | Qty: 30 | Fill #0

## 2020-03-06 ENCOUNTER — Other Ambulatory Visit: Payer: Self-pay | Admitting: Physician Assistant

## 2020-03-06 MED ORDER — BUPROPION HCL ER (XL) 150 MG PO TB24: 150.0000 mg | ORAL_TABLET | ORAL | 5 refills | Status: DC

## 2020-03-06 MED FILL — buPROPion HCL ER (XL) 150 M: 150 | 30 days supply | Qty: 30 | Fill #0

## 2020-03-08 ENCOUNTER — Other Ambulatory Visit: Payer: Self-pay | Admitting: Physician Assistant

## 2020-03-08 ENCOUNTER — Ambulatory Visit (INDEPENDENT_AMBULATORY_CARE_PROVIDER_SITE_OTHER): Payer: PRIVATE HEALTH INSURANCE | Admitting: Physician Assistant

## 2020-03-08 ENCOUNTER — Other Ambulatory Visit: Payer: Self-pay

## 2020-03-08 ENCOUNTER — Encounter: Payer: Self-pay | Admitting: Physician Assistant

## 2020-03-08 VITALS — BP 129/86 | HR 96

## 2020-03-08 DIAGNOSIS — F3181 Bipolar II disorder: Secondary | ICD-10-CM | POA: Diagnosis not present

## 2020-03-08 DIAGNOSIS — F988 Other specified behavioral and emotional disorders with onset usually occurring in childhood and adolescence: Secondary | ICD-10-CM | POA: Diagnosis not present

## 2020-03-08 DIAGNOSIS — Z566 Other physical and mental strain related to work: Secondary | ICD-10-CM | POA: Diagnosis not present

## 2020-03-08 MED ORDER — REXULTI 4 MG PO TABS: 4.0000 mg | ORAL_TABLET | Freq: Every day | ORAL | 1 refills | Status: DC

## 2020-03-08 MED ORDER — AMPHETAMINE-DEXTROAMPHETAMINE 30 MG PO TABS: 30.0000 mg | ORAL_TABLET | Freq: Two times a day (BID) | ORAL | 0 refills | Status: DC

## 2020-03-08 MED ORDER — AMPHETAMINE-DEXTROAMPHETAMINE 30 MG PO TABS: 1.0000 | ORAL_TABLET | Freq: Two times a day (BID) | ORAL | 0 refills | Status: DC

## 2020-03-08 MED FILL — REXULTI 4 MG TABS: 4 | 30 days supply | Qty: 30 | Fill #0

## 2020-03-08 MED FILL — AMPHETAMINE-DEXTROAMPHETAMI: 30 | 30 days supply | Qty: 60 | Fill #0

## 2020-03-08 NOTE — Progress Notes (Signed)
[facility]  Patient ID: Tara Sullivan,  MRN: [id number]  PCP: Morrell Riddle, PA-C  Date of Evaluation: 03/08/2020 Time spent:30 minutes  Chief Complaint:  Chief Complaint    ADD      HISTORY/CURRENT STATUS: HPI For routine med check.     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 It caught her off guard.  She has not noticed any change in her moods.  States that she felt like she and the doctor she works with have a good relationship so it surprised her when the doctor reported that to her boss's boss.  They are going to have a meeting about it so she can understand what she was doing wrong.  No one else in her family or friend group has told her that she is moody.  Does states she is stressed at work.  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  They are very short handed and instead of taking care of of one of the providers needs, now she has to take care of 2 providers.  Other CNA's are leaving for various reasons, including different opportunities due to finances, 1 is moving away.  For the most part Irine enjoys her job.  She is able to enjoy things. Energy and motivation are good.  Not isolating.  She gets together with friends at least 1 evening every week.  She is not missing any work due to her mental health.  Appetite and weight are her usual.  Not having a lot of anxiety except for a situation as noted above.  She sleeps well.  Denies suicidal or homicidal thoughts.  The Adderall is still effective.  She is able to focus on her work and get tasks completed in a timely manner.  She is not having any side effects from the Adderall.  She is able to go to sleep just fine.  Denies increased energy with decreased need for sleep.  No impulsivity, risky behavior, increased spending, no grandiosity, no increased irritability, no paranoia, no hallucinations.  Denies dizziness, syncope, seizures, numbness, tingling,  tremor, tics, unsteady gait, slurred speech, confusion. Denies muscle or joint pain, stiffness, or dystonia. Denies unexplained weight loss, frequent infections, or sores that heal slowly.  No polyphagia, polydipsia, or polyuria. Denies visual changes or paresthesias.   Individual Medical History/ Review of Systems: Changes? :No    Past medications for mental health diagnoses include: Zoloft, Seroquel, lithium, Latuda caused stomach pain, Wellbutrin, BuSpar, Pristiq, Klonopin, Prazosin x 1 night, then no longer needed, Strattera, Trileptal caused nausea, Lamictal didn't help.  Allergies: Latex and Trileptal [oxcarbazepine]  Current Medications:  Current Outpatient Medications:  .  Brexpiprazole (REXULTI) 4 MG TABS, Take 4 mg by mouth daily., Disp: 30 tablet, Rfl: 1 .  buPROPion (WELLBUTRIN XL) 150 MG 24 hr tablet, Take 1 tablet (150 mg total) by mouth every morning., Disp: 30 tablet, Rfl: 5 .  etonogestrel-ethinyl estradiol (NUVARING) 0.12-0.015 MG/24HR vaginal ring, Insert vaginally and leave in place for 3 consecutive weeks, then remove for 1 week., Disp: 3 each, Rfl: 4 .  Melatonin 3-10 MG TABS, Take 3-10 mg by mouth at bedtime as needed., Disp: 30 tablet, Rfl: 0 .  [START ON 05/04/2020] amphetamine-dextroamphetamine (ADDERALL) 30 MG tablet, Take 1 tablet by mouth 2 (two) times daily., Disp: 60 tablet, Rfl: 0 .  [START ON 04/07/2020] amphetamine-dextroamphetamine (ADDERALL) 30 MG tablet, Take 1 tablet by mouth 2 (two) times daily.,  Disp: 60 tablet, Rfl: 0 .  amphetamine-dextroamphetamine (ADDERALL) 30 MG tablet, Take 1 tablet by mouth 2 (two) times daily., Disp: 60 tablet, Rfl: 0 .  fluticasone (FLONASE) 50 MCG/ACT nasal spray, Place 2 sprays into both nostrils daily for 10 days., Disp: 16 g, Rfl: 0 Medication Side Effects: none  Family Medical/ Social History: Changes?  No  MENTAL HEALTH EXAM:  Blood pressure 129/86, pulse 96.There is no height or weight on file to calculate BMI.  General  Appearance: Casual, Neat, Well Groomed and Obese  Eye Contact:  Good  Speech:  Clear and Coherent and Normal Rate  Volume:  Normal  Mood:  Euthymic  Affect:  Appropriate  Thought Process:  Goal Directed and Descriptions of Associations: Intact  Orientation:  Full (Time, Place, and Person)  Thought Content: Logical   Suicidal Thoughts:  No  Homicidal Thoughts:  No  Memory:  WNL  Judgement:  Good  Insight:  Good  Psychomotor Activity:  Normal  Concentration:  Concentration: Good and Attention Span: Good  Recall:  Good  Fund of Knowledge: Good  Language: Good  Assets:  Desire for Improvement  ADL's:  Intact  Cognition: WNL  Prognosis:  Good    DIAGNOSES:    ICD-10-CM   1. Bipolar II disorder (HCC)  F31.81   2. 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 Work stress  Z56.6     Receiving Psychotherapy: No  has seen Kerri Perches in the past.    RECOMMENDATIONS:  PDMP was reviewed. I provided 30 minutes of face-to-face time during this encounter, in which we discussed the with moodiness has been observed in coworker. Bobbette has seemed to be very stable as far as the bipolar disorder goes for the last year.  No changes have been made in medications or the doses of the antipsychotic.  She and I agree that we should increase the dose just to see if that changes her behavior at all.  If not, we will decrease the dose back down.  It will be difficult to assess that since she or any of her friends or family have not noticed it.  So she will monitor things at work and once she gets feedback from the provider that has reported this, she will let me know. As far as the ADD is concerned the Adderall is still working well and we will make no changes in that medication.. Continue Adderall 30 mg, 1 p.o. every morning and 1 around lunchtime. Increase Rexulti to 4 mg. Continue Wellbutrin XL 150 mg every morning. Return to counseling as needed. Return in 6 weeks.  Melony Overly, PA-C

## 2020-04-06 MED FILL — buPROPion HCL ER (XL) 150 M: 150 | 30 days supply | Qty: 30 | Fill #0

## 2020-04-10 MED FILL — AMPHETAMINE-DEXTROAMPHETAMI: 30 | 30 days supply | Qty: 60 | Fill #0

## 2020-04-12 MED FILL — REXULTI 4 MG TABS: 4 | 30 days supply | Qty: 30 | Fill #1

## 2020-04-25 ENCOUNTER — Encounter: Payer: Self-pay | Admitting: Physician Assistant

## 2020-04-25 ENCOUNTER — Ambulatory Visit (INDEPENDENT_AMBULATORY_CARE_PROVIDER_SITE_OTHER): Payer: PRIVATE HEALTH INSURANCE | Admitting: Physician Assistant

## 2020-04-25 ENCOUNTER — Other Ambulatory Visit: Payer: Self-pay

## 2020-04-25 ENCOUNTER — Other Ambulatory Visit: Payer: Self-pay | Admitting: Physician Assistant

## 2020-04-25 DIAGNOSIS — F3181 Bipolar II disorder: Secondary | ICD-10-CM | POA: Diagnosis not present

## 2020-04-25 DIAGNOSIS — F411 Generalized anxiety disorder: Secondary | ICD-10-CM | POA: Diagnosis not present

## 2020-04-25 DIAGNOSIS — F988 Other specified behavioral and emotional disorders with onset usually occurring in childhood and adolescence: Secondary | ICD-10-CM

## 2020-04-25 DIAGNOSIS — Z566 Other physical and mental strain related to work: Secondary | ICD-10-CM

## 2020-04-25 DIAGNOSIS — G479 Sleep disorder, unspecified: Secondary | ICD-10-CM

## 2020-04-25 MED ORDER — AMPHETAMINE-DEXTROAMPHETAMINE 30 MG PO TABS: 1.0000 | ORAL_TABLET | Freq: Two times a day (BID) | ORAL | 0 refills | Status: DC

## 2020-04-25 MED ORDER — REXULTI 4 MG PO TABS: 4.0000 mg | ORAL_TABLET | Freq: Every day | ORAL | 5 refills | Status: DC

## 2020-04-25 MED ORDER — AMPHETAMINE-DEXTROAMPHETAMINE 30 MG PO TABS: 30.0000 mg | ORAL_TABLET | Freq: Two times a day (BID) | ORAL | 0 refills | Status: DC

## 2020-04-25 NOTE — Progress Notes (Signed)
[facility]  Patient ID: Tara Sullivan,  MRN: [id number]  PCP: Morrell Riddle, PA-C  Date of Evaluation: 04/25/2020 Time spent:20 minutes  Chief Complaint:  Chief Complaint    ADD      HISTORY/CURRENT STATUS: HPI For routine med check.     Was seen 6 weeks ago and Rexulti was increased.  She had been complained about to her supervisor that she was moody.  Patient herself did not really notice any changes in still has not felt any different after increasing the Rexulti but she reports that her mom said she does seem to be better since the increase was made.  Since then, she had a annual review at work [facility] health urology practice) and was told that she sits at her desk too much.  In her defense she states she is doing task while sitting at her desk.  She was not given any suggestions on how to improve but was told she has to come up with something.  She is still florid with the whole situation.  She was not written up to the point that any line was drawn in the sand.  She is able to enjoy things. Energy and motivation are good.  Not isolating. She is not missing any work due to her mental health.  Appetite and weight are her usual.  Not having a lot of anxiety except for a situation as noted above.  She sleeps well.  Denies suicidal or homicidal thoughts.  The Adderall is still effective.  Able to focus and get things done in a timely fashion.  Denies increased energy with decreased need for sleep.  No impulsivity, risky behavior, increased spending, no grandiosity, no increased irritability, no paranoia, no hallucinations.  Denies dizziness, syncope, seizures, numbness, tingling, tremor, tics, unsteady gait, slurred speech, confusion. Denies muscle or joint pain, stiffness, or dystonia. Denies unexplained weight loss, frequent infections, or sores that heal slowly.  No polyphagia, polydipsia, or polyuria. Denies visual changes or paresthesias.   Individual Medical  History/ Review of Systems: Changes? :No    Past medications for mental health diagnoses include: Zoloft, Seroquel, lithium, Latuda caused stomach pain, Wellbutrin, BuSpar, Pristiq, Klonopin, Prazosin x 1 night, then no longer needed, Strattera, Trileptal caused nausea, Lamictal didn't help.  Allergies: Latex and Trileptal [oxcarbazepine]  Current Medications:  Current Outpatient Medications:  .  [START ON 05/04/2020] amphetamine-dextroamphetamine (ADDERALL) 30 MG tablet, Take 1 tablet by mouth 2 (two) times daily., Disp: 60 tablet, Rfl: 0 .  buPROPion (WELLBUTRIN XL) 150 MG 24 hr tablet, Take 1 tablet (150 mg total) by mouth every morning., Disp: 30 tablet, Rfl: 5 .  etonogestrel-ethinyl estradiol (NUVARING) 0.12-0.015 MG/24HR vaginal ring, Insert vaginally and leave in place for 3 consecutive weeks, then remove for 1 week., Disp: 3 each, Rfl: 4 .  Melatonin 3-10 MG TABS, Take 3-10 mg by mouth at bedtime as needed., Disp: 30 tablet, Rfl: 0 .  [START ON 06/06/2020] amphetamine-dextroamphetamine (ADDERALL) 30 MG tablet, Take 1 tablet by mouth 2 (two) times daily., Disp: 60 tablet, Rfl: 0 .  [START ON 05/07/2020] amphetamine-dextroamphetamine (ADDERALL) 30 MG tablet, Take 1 tablet by mouth 2 (two) times daily., Disp: 60 tablet, Rfl: 0 .  Brexpiprazole (REXULTI) 4 MG TABS, Take 4 mg by mouth daily., Disp: 30 tablet, Rfl: 5 .  fluticasone (FLONASE) 50 MCG/ACT nasal spray, Place 2 sprays into both nostrils daily for 10 days., Disp: 16 g, Rfl: 0 Medication Side Effects: none  Family Medical/ Social History: Changes?  No  MENTAL HEALTH EXAM:  There were no vitals taken for this visit.There is no height or weight on file to calculate BMI.  General Appearance: Casual, Neat, Well Groomed and Obese  Eye Contact:  Good  Speech:  Clear and Coherent and Normal Rate  Volume:  Normal  Mood:  Euthymic  Affect:  Appropriate  Thought Process:  Goal Directed and Descriptions of Associations: Intact   Orientation:  Full (Time, Place, and Person)  Thought Content: Logical   Suicidal Thoughts:  No  Homicidal Thoughts:  No  Memory:  WNL  Judgement:  Good  Insight:  Good  Psychomotor Activity:  Normal  Concentration:  Concentration: Good and Attention Span: Good  Recall:  Good  Fund of Knowledge: Good  Language: Good  Assets:  Desire for Improvement  ADL's:  Intact  Cognition: WNL  Prognosis:  Good    DIAGNOSES:    ICD-10-CM   1. Bipolar II disorder (HCC)  F31.81   2. 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 Work stress  Z56.6   4. Generalized anxiety disorder  F41.1   5. Sleep disturbance  G47.9     Receiving Psychotherapy: No  has seen Kerri Perches in the past.    RECOMMENDATIONS:  PDMP was reviewed. I provided 20 minutes of face-to-face time during this encounter, in which we discussed her response to the medications.  She is doing well since we increased the Rexulti so no changes will be made. Continue Adderall 30 mg, 1 p.o. every morning and 1 around lunchtime. Continue Rexulti 4 mg. Continue Wellbutrin XL 150 mg every morning. Return to counseling as needed. Return in 3-4 months.  Melony Overly, PA-C

## 2020-05-04 MED FILL — buPROPion HCL ER (XL) 150 M: 150 | 30 days supply | Qty: 30 | Fill #1

## 2020-05-10 MED FILL — AMPHETAMINE-DEXTROAMPHETAMI: 30 | 30 days supply | Qty: 60 | Fill #0

## 2020-05-10 MED FILL — REXULTI 4 MG TABS: 4 | 30 days supply | Qty: 30 | Fill #0

## 2020-05-23 MED FILL — ETONOGESTREL-ETHINYL ESTRAD: 0.12-0.015 | 84 days supply | Qty: 3 | Fill #2

## 2020-05-26 ENCOUNTER — Other Ambulatory Visit (HOSPITAL_COMMUNITY): Payer: Self-pay | Admitting: Adult Health

## 2020-05-26 MED FILL — MELOXICAM 15 MG TABLET: 15 | 20 days supply | Qty: 20 | Fill #0

## 2020-06-03 ENCOUNTER — Other Ambulatory Visit (HOSPITAL_COMMUNITY): Payer: Self-pay

## 2020-06-03 MED FILL — Meloxicam Tab 15 MG: ORAL | 20 days supply | Qty: 20 | Fill #0 | Status: CN

## 2020-06-06 ENCOUNTER — Other Ambulatory Visit (HOSPITAL_COMMUNITY): Payer: Self-pay

## 2020-06-06 MED FILL — Bupropion HCl Tab ER 24HR 150 MG: ORAL | 30 days supply | Qty: 30 | Fill #0 | Status: AC

## 2020-06-08 ENCOUNTER — Other Ambulatory Visit (HOSPITAL_COMMUNITY): Payer: Self-pay

## 2020-06-09 ENCOUNTER — Other Ambulatory Visit: Payer: Self-pay | Admitting: Physician Assistant

## 2020-06-09 ENCOUNTER — Other Ambulatory Visit (HOSPITAL_COMMUNITY): Payer: Self-pay

## 2020-06-09 MED ORDER — AMPHETAMINE-DEXTROAMPHETAMINE 30 MG PO TABS
ORAL_TABLET | ORAL | 0 refills | Status: DC
  Filled 2020-06-09: qty 60, 30d supply, fill #0

## 2020-06-09 MED FILL — Brexpiprazole Tab 4 MG: ORAL | 30 days supply | Qty: 30 | Fill #0 | Status: AC

## 2020-06-09 NOTE — Telephone Encounter (Signed)
Controlled substance 

## 2020-06-12 ENCOUNTER — Other Ambulatory Visit (HOSPITAL_COMMUNITY): Payer: Self-pay

## 2020-06-13 ENCOUNTER — Other Ambulatory Visit (HOSPITAL_COMMUNITY): Payer: Self-pay

## 2020-07-26 ENCOUNTER — Ambulatory Visit: Payer: PRIVATE HEALTH INSURANCE | Admitting: Physician Assistant

## 2020-08-02 ENCOUNTER — Telehealth: Payer: Self-pay | Admitting: Physician Assistant

## 2020-08-02 ENCOUNTER — Other Ambulatory Visit: Payer: Self-pay

## 2020-08-02 MED ORDER — AMPHETAMINE-DEXTROAMPHETAMINE 30 MG PO TABS
30.0000 mg | ORAL_TABLET | Freq: Two times a day (BID) | ORAL | 0 refills | Status: DC
  Filled 2020-08-02: qty 60, 30d supply, fill #0

## 2020-08-02 MED ORDER — AMPHETAMINE-DEXTROAMPHETAMINE 30 MG PO TABS
1.0000 | ORAL_TABLET | Freq: Two times a day (BID) | ORAL | 0 refills | Status: DC
Start: 2020-09-01 — End: 2020-11-07
  Filled 2020-09-28: qty 60, 30d supply, fill #0

## 2020-08-02 NOTE — Telephone Encounter (Signed)
Inform pt she will have 2 Rx's on file to be filled. She will need to schedule apt with Rosey Bath in August

## 2020-08-02 NOTE — Telephone Encounter (Signed)
Yes, ok to pend 2 Rxs. She needs OV in August

## 2020-08-02 NOTE — Telephone Encounter (Signed)
Apt 04/25/20  No show 2/25  Okay to pend?

## 2020-08-02 NOTE — Telephone Encounter (Signed)
Pt called and has a new job and doesn't have insurance. She needs  adderall script to be sent to the [facility] at [address] so she can use good rx

## 2020-08-03 ENCOUNTER — Other Ambulatory Visit (HOSPITAL_COMMUNITY): Payer: Self-pay

## 2020-08-11 ENCOUNTER — Other Ambulatory Visit (HOSPITAL_COMMUNITY): Payer: Self-pay

## 2020-08-29 ENCOUNTER — Other Ambulatory Visit: Payer: Self-pay

## 2020-08-29 ENCOUNTER — Telehealth: Payer: Self-pay | Admitting: Physician Assistant

## 2020-08-29 MED ORDER — BUPROPION HCL ER (XL) 150 MG PO TB24: ORAL_TABLET | Freq: Every morning | ORAL | 2 refills | Status: DC

## 2020-08-29 NOTE — Telephone Encounter (Signed)
Pt left a message that she has a new job and will not have insurance until aug 1 st. She needs her wellbutrin to be sent to the [facility] on [address]

## 2020-08-29 NOTE — Telephone Encounter (Signed)
RX sent

## 2020-09-28 ENCOUNTER — Other Ambulatory Visit (HOSPITAL_COMMUNITY): Payer: Self-pay

## 2020-09-28 MED FILL — Brexpiprazole Tab 4 MG: ORAL | 30 days supply | Qty: 30 | Fill #1 | Status: CN

## 2020-09-29 ENCOUNTER — Other Ambulatory Visit (HOSPITAL_COMMUNITY): Payer: Self-pay

## 2020-10-02 ENCOUNTER — Other Ambulatory Visit (HOSPITAL_COMMUNITY): Payer: Self-pay

## 2020-10-05 ENCOUNTER — Other Ambulatory Visit (HOSPITAL_COMMUNITY): Payer: Self-pay

## 2020-10-06 ENCOUNTER — Other Ambulatory Visit (HOSPITAL_COMMUNITY): Payer: Self-pay

## 2020-10-07 ENCOUNTER — Other Ambulatory Visit (HOSPITAL_COMMUNITY): Payer: Self-pay

## 2020-10-09 ENCOUNTER — Other Ambulatory Visit (HOSPITAL_COMMUNITY): Payer: Self-pay

## 2020-10-10 ENCOUNTER — Other Ambulatory Visit (HOSPITAL_COMMUNITY): Payer: Self-pay

## 2020-10-11 ENCOUNTER — Other Ambulatory Visit (HOSPITAL_COMMUNITY): Payer: Self-pay

## 2020-10-11 MED FILL — Brexpiprazole Tab 4 MG: ORAL | 30 days supply | Qty: 30 | Fill #1 | Status: AC

## 2020-10-12 ENCOUNTER — Other Ambulatory Visit (HOSPITAL_COMMUNITY): Payer: Self-pay

## 2020-10-12 ENCOUNTER — Telehealth: Payer: Self-pay | Admitting: *Deleted

## 2020-10-12 DIAGNOSIS — Z3044 Encounter for surveillance of vaginal ring hormonal contraceptive device: Secondary | ICD-10-CM

## 2020-10-12 MED ORDER — ETONOGESTREL-ETHINYL ESTRADIOL 0.12-0.015 MG/24HR VA RING
VAGINAL_RING | VAGINAL | 0 refills | Status: DC
  Filled 2020-10-12: qty 3, 84d supply, fill #0

## 2020-10-12 NOTE — Telephone Encounter (Signed)
Yes that's fine. Thank you

## 2020-10-12 NOTE — Telephone Encounter (Signed)
Patient called annual exam now scheduled on 11/07/20, patient reports she was without insurance and now has insurance okay to send Rx for nuvaring?

## 2020-10-12 NOTE — Telephone Encounter (Signed)
Patient called back with pharmacy. Rx sent. 

## 2020-10-12 NOTE — Telephone Encounter (Signed)
Left message patient to call to find out which pharmacy to send Rx.

## 2020-11-02 ENCOUNTER — Telehealth: Payer: Self-pay | Admitting: Physician Assistant

## 2020-11-02 ENCOUNTER — Other Ambulatory Visit: Payer: Self-pay

## 2020-11-02 MED ORDER — BREXPIPRAZOLE 4 MG PO TABS: 1.0000 | ORAL_TABLET | Freq: Every day | ORAL | 1 refills | Status: DC

## 2020-11-02 MED ORDER — BUPROPION HCL ER (XL) 150 MG PO TB24: ORAL_TABLET | Freq: Every morning | ORAL | 1 refills | Status: DC

## 2020-11-02 MED ORDER — AMPHETAMINE-DEXTROAMPHETAMINE 30 MG PO TABS: 30.0000 mg | ORAL_TABLET | Freq: Two times a day (BID) | ORAL | 0 refills | Status: DC

## 2020-11-02 NOTE — Telephone Encounter (Signed)
Pended.

## 2020-11-02 NOTE — Telephone Encounter (Signed)
Pt called and would like refill on Adderall Wellbutrin and Rexaulti 4mg .  Pharmacy to use is for all 3 scripts.  Appt  is 10/20.

## 2020-11-07 ENCOUNTER — Other Ambulatory Visit: Payer: Self-pay

## 2020-11-07 ENCOUNTER — Encounter: Payer: Self-pay | Admitting: Nurse Practitioner

## 2020-11-07 ENCOUNTER — Ambulatory Visit (INDEPENDENT_AMBULATORY_CARE_PROVIDER_SITE_OTHER): Payer: No Typology Code available for payment source | Admitting: Nurse Practitioner

## 2020-11-07 ENCOUNTER — Other Ambulatory Visit (HOSPITAL_COMMUNITY)
Admission: RE | Admit: 2020-11-07 | Discharge: 2020-11-07 | Disposition: A | Discharge status: Home / Self Care | Payer: No Typology Code available for payment source | Source: Ambulatory Visit | Attending: Nurse Practitioner | Admitting: Nurse Practitioner

## 2020-11-07 ENCOUNTER — Telehealth: Payer: Self-pay | Admitting: Physician Assistant

## 2020-11-07 ENCOUNTER — Other Ambulatory Visit (HOSPITAL_COMMUNITY): Payer: Self-pay

## 2020-11-07 VITALS — BP 122/76 | Ht 67.0 in | Wt 265.0 lb

## 2020-11-07 DIAGNOSIS — Z3044 Encounter for surveillance of vaginal ring hormonal contraceptive device: Secondary | ICD-10-CM

## 2020-11-07 DIAGNOSIS — Z01419 Encounter for gynecological examination (general) (routine) without abnormal findings: Secondary | ICD-10-CM | POA: Diagnosis not present

## 2020-11-07 DIAGNOSIS — Z113 Encounter for screening for infections with a predominantly sexual mode of transmission: Secondary | ICD-10-CM

## 2020-11-07 MED ORDER — AMPHETAMINE-DEXTROAMPHETAMINE 30 MG PO TABS
1.0000 | ORAL_TABLET | Freq: Two times a day (BID) | ORAL | 0 refills | Status: DC
  Filled 2020-11-07: qty 60, 30d supply, fill #0

## 2020-11-07 MED ORDER — ETONOGESTREL-ETHINYL ESTRADIOL 0.12-0.015 MG/24HR VA RING: VAGINAL_RING | VAGINAL | 4 refills | Status: AC

## 2020-11-07 NOTE — Telephone Encounter (Signed)
Pended.

## 2020-11-07 NOTE — Telephone Encounter (Signed)
Pt called to ask that Adderall be sent to [facility] since [facility] doesn't have any in stock.

## 2020-11-07 NOTE — Progress Notes (Signed)
   Tara Sullivan 11/13/1990 [id number]   History:  30 y.o. SWF G0 presents for annual exam without GYN complaints. Amenorrheic on continuous Nuvaring. Normal pap history. Gardasil series completed. History of anxiety/depression, bipolar disorder, drug and alcohol abuse in the past, being managed by psychiatrist with medications and counseling.  Gynecologic History No LMP recorded. (Menstrual status: Nuvaring vaginal inserts continuously).   Contraception: NuvaRing vaginal inserts continuous Sexually active: Yes  Health Maintenance Last Pap: 06/01/2018. Results were: Normal, 3-year repeat Last mammogram: Not indicated Last colonoscopy: Not indicated Last Dexa: Not indicated  Past medical history, past surgical history, family history and social history were all reviewed and documented in the EPIC chart. CMA are [facility].   ROS:  A ROS was performed and pertinent positives and negatives are included.  Exam:  Vitals:   11/07/20 1027  BP: 122/76  Weight: 265 lb (120.2 kg)  Height: 5\' 7"  (1.702 m)    Body mass index is 41.5 kg/m.  General appearance:  Normal Thyroid:  Symmetrical, normal in size, without palpable masses or nodularity./ Respiratory  Auscultation:  Clear without wheezing or rhonchi Cardiovascular  Auscultation:  Regular rate, without rubs, murmurs or gallops  Edema/varicosities:  Not grossly evident Abdominal  Soft,nontender, without masses, guarding or rebound.  Liver/spleen:  No organomegaly noted  Hernia:  None appreciated  Skin  Inspection:  Grossly normal   Breasts: Examined lying and sitting.   Right: Without masses, retractions, discharge or axillary adenopathy.   Left: Without masses, retractions, discharge or axillary adenopathy. Gentitourinary   Inguinal/mons:  Normal without inguinal adenopathy  External genitalia:  Normal  BUS/Urethra/Skene's glands:  Normal  Vagina:  Normal, Nuvaring palpated  Cervix:  Normal  Uterus:  Normal in  size, shape and contour.  Midline and mobile  Adnexa/parametria:     Rt: Without masses or tenderness.   Lt: Without masses or tenderness.  Anus and perineum: Normal  Patient informed chaperone available to be present for breast and pelvic exam. Patient has requested no chaperone to be present. Patient has been advised what will be completed during breast and pelvic exam.   Assessment/Plan:  30 y.o. for annual exam.   Well female exam with routine gynecological exam - Plan: CBC with Differential/Platelet, Comprehensive metabolic panel, Lipid panel, Cytology - PAP( [place]). Education provided on SBEs, importance of preventative screenings, current guidelines, high calcium diet, regular exercise, and multivitamin daily.   Encounter for surveillance of vaginal ring hormonal contraceptive device - Plan: etonogestrel-ethinyl estradiol (NUVARING) 0.12-0.015 MG/24HR vaginal ring continuously. Using as prescribed. Refill x 1 year provided.   Screen for STD (sexually transmitted disease) - Plan: RPR, HIV Antibody (routine testing w rflx), Cytology - PAP( [place]). Gonorrhea, chlamydia, trich added to pap.  Screening for cervical cancer - Normal pap history. Pap with HR HPV today.   Follow-up in 1 year for annual    03-30-1975 [facility], 10:50 AM 11/07/2020

## 2020-11-09 LAB — CYTOLOGY - PAP
Chlamydia: NEGATIVE
Comment: NEGATIVE
Comment: NEGATIVE
Comment: NEGATIVE
Comment: NORMAL
Diagnosis: NEGATIVE
High risk HPV: NEGATIVE
Neisseria Gonorrhea: NEGATIVE
Trichomonas: NEGATIVE

## 2020-11-10 LAB — LIPID PANEL
Cholesterol: 150 mg/dL (ref <200)
HDL: 69 mg/dL (ref >=50)
LDL Cholesterol (Calc): 58 mg/dL (calc)
Non-HDL Cholesterol (Calc): 81 mg/dL (calc) (ref <130)
Total CHOL/HDL Ratio: 2.2 (calc) (ref <5.0)
Triglycerides: 147 mg/dL (ref <150)

## 2020-11-10 LAB — COMPREHENSIVE METABOLIC PANEL
AG Ratio: 1.4 (calc) (ref 1.0–2.5)
ALT: 9 U/L (ref 6–29)
AST: 12 U/L (ref 10–30)
Albumin: 3.9 g/dL (ref 3.6–5.1)
Alkaline phosphatase (APISO): 72 U/L (ref 31–125)
BUN: 8 mg/dL (ref 7–25)
CO2: 21 mmol/L (ref 20–32)
Calcium: 8.8 mg/dL (ref 8.6–10.2)
Chloride: 107 mmol/L (ref 98–110)
Creat: 0.78 mg/dL (ref 0.50–0.97)
Globulin: 2.7 g/dL (calc) (ref 1.9–3.7)
Glucose, Bld: 124 mg/dL — ABNORMAL HIGH (ref 65–99)
Potassium: 4.1 mmol/L (ref 3.5–5.3)
Sodium: 139 mmol/L (ref 135–146)
Total Bilirubin: 0.3 mg/dL (ref 0.2–1.2)
Total Protein: 6.6 g/dL (ref 6.1–8.1)

## 2020-11-10 LAB — CBC WITH DIFFERENTIAL/PLATELET
Absolute Monocytes: 431 cells/uL (ref 200–950)
Basophils Absolute: 44 cells/uL (ref 0–200)
Basophils Relative: 0.5 %
Eosinophils Absolute: 70 cells/uL (ref 15–500)
Eosinophils Relative: 0.8 %
HCT: 39.4 % (ref 35.0–45.0)
Hemoglobin: 12.9 g/dL (ref 11.7–15.5)
Lymphs Abs: 2411 cells/uL (ref 850–3900)
MCH: 28.2 pg (ref 27.0–33.0)
MCHC: 32.7 g/dL (ref 32.0–36.0)
MCV: 86 fL (ref 80.0–100.0)
MPV: 10.4 fL (ref 7.5–12.5)
Monocytes Relative: 4.9 %
Neutro Abs: 5843 cells/uL (ref 1500–7800)
Neutrophils Relative %: 66.4 %
Platelets: 351 10*3/uL (ref 140–400)
RBC: 4.58 10*6/uL (ref 3.80–5.10)
RDW: 12.5 % (ref 11.0–15.0)
Total Lymphocyte: 27.4 %
WBC: 8.8 10*3/uL (ref 3.8–10.8)

## 2020-11-10 LAB — RPR: RPR Ser Ql: NONREACTIVE

## 2020-11-10 LAB — HEMOGLOBIN A1C
Hgb A1c MFr Bld: 5.5 % of total Hgb (ref <5.7)
Mean Plasma Glucose: 111 mg/dL
eAG (mmol/L): 6.2 mmol/L

## 2020-11-10 LAB — HIV ANTIBODY (ROUTINE TESTING W REFLEX): HIV 1&2 Ab, 4th Generation: NONREACTIVE

## 2020-11-14 ENCOUNTER — Telehealth: Payer: Self-pay

## 2020-11-14 NOTE — Telephone Encounter (Signed)
Prior Authorization submitted for REXULTI 4 MG approval received effective 10/11/2020-10/11/2021 with [employer] [facility]. PA# [id number]

## 2020-12-21 ENCOUNTER — Ambulatory Visit (INDEPENDENT_AMBULATORY_CARE_PROVIDER_SITE_OTHER): Payer: No Typology Code available for payment source | Admitting: Physician Assistant

## 2020-12-21 ENCOUNTER — Other Ambulatory Visit: Payer: Self-pay

## 2020-12-21 ENCOUNTER — Encounter: Payer: Self-pay | Admitting: Physician Assistant

## 2020-12-21 VITALS — BP 123/78 | HR 86

## 2020-12-21 DIAGNOSIS — R6882 Decreased libido: Secondary | ICD-10-CM | POA: Diagnosis not present

## 2020-12-21 DIAGNOSIS — F988 Other specified behavioral and emotional disorders with onset usually occurring in childhood and adolescence: Secondary | ICD-10-CM | POA: Diagnosis not present

## 2020-12-21 DIAGNOSIS — F3181 Bipolar II disorder: Secondary | ICD-10-CM | POA: Diagnosis not present

## 2020-12-21 MED ORDER — AMPHETAMINE-DEXTROAMPHETAMINE 30 MG PO TABS: 30.0000 mg | ORAL_TABLET | Freq: Two times a day (BID) | ORAL | 0 refills | Status: DC

## 2020-12-21 MED ORDER — BREXPIPRAZOLE 4 MG PO TABS: 1.0000 | ORAL_TABLET | Freq: Every day | ORAL | 1 refills | Status: DC

## 2020-12-21 MED ORDER — AMPHETAMINE-DEXTROAMPHETAMINE 30 MG PO TABS: 1.0000 | ORAL_TABLET | Freq: Two times a day (BID) | ORAL | 0 refills | Status: DC

## 2020-12-21 NOTE — Progress Notes (Signed)
[facility]  Patient ID: Tara Sullivan,  MRN: [id number]  PCP: Larkin Ina  Date of Evaluation: 12/21/2020 Time spent:30 minutes  Chief Complaint:  Chief Complaint   ADHD; Depression; Follow-up      HISTORY/CURRENT STATUS: HPI For routine med check.     Dezerae is doing well with her mental health.  She is able to enjoy things.  Energy and motivation are good most of the time. Enjoys her new job at [employer]. Has been there about 5 months now. Personal hygiene is nl. Not isolating. Not crying easily.  Denies suicidal or homicidal thoughts.  States that attention is good without easy distractibility.  Able to focus on things and finish tasks to completion.   Patient denies increased energy with decreased need for sleep, no increased talkativeness, no racing thoughts, no impulsivity or risky behaviors, no increased spending, no increased libido, no grandiosity, no increased irritability or anger, and no hallucinations.  The only real complaint she has today is decreased libido. Not sure if med related or not. BC is Nuvaring and sometimes has dysparunia, maybe b/c of that.   Denies dizziness, syncope, seizures, numbness, tingling, tremor, tics, unsteady gait, slurred speech, confusion. Denies muscle or joint pain, stiffness, or dystonia. Denies unexplained weight loss, frequent infections, or sores that heal slowly.  No polyphagia, polydipsia, or polyuria. Denies visual changes or paresthesias.   Individual Medical History/ Review of Systems: Changes? :No    Past medications for mental health diagnoses include: Zoloft, Seroquel, lithium, Latuda caused stomach pain, Wellbutrin, BuSpar, Pristiq, Klonopin, Prazosin x 1 night, then no longer needed, Strattera, Trileptal caused nausea, Lamictal didn't help.  Allergies: Latex and Trileptal [oxcarbazepine]  Current Medications:  Current Outpatient Medications:    buPROPion (WELLBUTRIN XL) 150 MG  24 hr tablet, TAKE 1 TABLET BY MOUTH EVERY MORNING., Disp: 30 tablet, Rfl: 1   etonogestrel-ethinyl estradiol (NUVARING) 0.12-0.015 MG/24HR vaginal ring, Insert 1 ring vaginally every 4 weeks, skipping placebo week (use continuously), Disp: 3 each, Rfl: 4   [START ON 03/13/2021] amphetamine-dextroamphetamine (ADDERALL) 30 MG tablet, Take 1 tablet by mouth 2 (two) times daily., Disp: 60 tablet, Rfl: 0   [START ON 02/10/2021] amphetamine-dextroamphetamine (ADDERALL) 30 MG tablet, Take 1 tablet by mouth 2 (two) times daily., Disp: 60 tablet, Rfl: 0   [START ON 01/13/2021] amphetamine-dextroamphetamine (ADDERALL) 30 MG tablet, Take 1 tablet by mouth 2 (two) times daily., Disp: 60 tablet, Rfl: 0   Brexpiprazole 4 MG TABS, Take 1 tablet by mouth daily., Disp: 90 tablet, Rfl: 1   cephALEXin (KEFLEX) 500 MG capsule, TAKE 1 CAPSULE BY MOUTH TWO TIMES DAILY, Disp: 14 capsule, Rfl: 0   fluticasone (FLONASE) 50 MCG/ACT nasal spray, Place 2 sprays into both nostrils daily for 10 days., Disp: 16 g, Rfl: 0   Melatonin 3-10 MG TABS, Take 3-10 mg by mouth at bedtime as needed. (Patient not taking: Reported on 12/21/2020), Disp: 30 tablet, Rfl: 0   meloxicam (MOBIC) 15 MG tablet, TAKE 1 TABLET BY MOUTH ONCE A DAY AS NEEDED (DONT TAKE WITH IBUPROFEN OR NAPROXEN) (Patient not taking: No sig reported), Disp: 20 tablet, Rfl: 0   Meth-Hyo-M Bl-Na Phos-Ph Sal (URO-MP) 118 MG CAPS, TAKE 1 CAPSULE BY MOUTH THREE TIMES DAILY AS NEEDED (Patient not taking: No sig reported), Disp: 20 capsule, Rfl: 0 Medication Side Effects: none  decreased libido possibly med related??  Family Medical/ Social History: Changes?  No  MENTAL HEALTH EXAM:  Blood pressure 123/78, pulse 86.There is  no height or weight on file to calculate BMI.  General Appearance: Casual, Neat, Well Groomed and Obese  Eye Contact:  Good  Speech:  Clear and Coherent and Normal Rate  Volume:  Normal  Mood:  Euthymic  Affect:  Appropriate  Thought Process:  Goal  Directed and Descriptions of Associations: Circumstantial  Orientation:  Full (Time, Place, and Person)  Thought Content: Logical   Suicidal Thoughts:  No  Homicidal Thoughts:  No  Memory:  WNL  Judgement:  Good  Insight:  Good  Psychomotor Activity:  Normal  Concentration:  Concentration: Good and Attention Span: Good  Recall:  Good  Fund of Knowledge: Good  Language: Good  Assets:  Desire for Improvement  ADL's:  Intact  Cognition: WNL  Prognosis:  Good    DIAGNOSES:    ICD-10-CM   1. Bipolar II disorder (HCC)  F31.81     2. 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 Decreased libido  R68.82        Receiving Psychotherapy: No  has seen Kerri Perches in the past.    RECOMMENDATIONS:  PDMP was reviewed.  Last Adderall filled 12/14/2020. I provided 30 minutes of face to face time during this encounter, including time spent before and after the visit in records review, medical decision making, counseling concerning today's visit, and charting.  I do not think Rexulti or Adderall could be causing the decreased libido and Wellbutrin is something we prescribe to help that problem especially when someone is on an SSRI that may be the cause of it.  Since she is having low energy and motivation at times I recommend increasing the Wellbutrin, just to try it see if that will help the libido and energy level.  I also recommend she contact her GYN.  The dyspareunia may be from the NuvaRing so she should discuss that with them. Otherwise no changes are necessary. Increase Wellbutrin XL to 300 mg daily. She'll double the 150 mgs she has now, but at the next RF, she'll let me know if she is doing better on the 300 mg dose and need to send that in for 90 days, unless she says otherwise. If she wants to go back to 150 mg, send in 90 day supply of that. She will call in about 3-4 weeks about this.  Continue Adderall 30 mg, 1 p.o. every morning and 1 around  lunchtime. Continue Rexulti 4 mg daily. Return in 3-4 months.  Melony Overly, PA-C

## 2021-01-03 ENCOUNTER — Telehealth: Payer: Self-pay | Admitting: Physician Assistant

## 2021-01-03 ENCOUNTER — Other Ambulatory Visit: Payer: Self-pay

## 2021-01-03 MED ORDER — BUPROPION HCL ER (XL) 150 MG PO TB24: ORAL_TABLET | Freq: Every morning | ORAL | 3 refills | Status: DC

## 2021-01-03 NOTE — Telephone Encounter (Signed)
Pended.

## 2021-01-03 NOTE — Telephone Encounter (Signed)
Pt LVM advising Rosey Bath that the Wellbutrin is working.  She would like a refill sent to [facility] on [address].  Appt 1/20

## 2021-01-29 ENCOUNTER — Other Ambulatory Visit: Payer: Self-pay

## 2021-01-29 ENCOUNTER — Telehealth: Payer: Self-pay | Admitting: Physician Assistant

## 2021-01-29 MED ORDER — BUPROPION HCL ER (XL) 300 MG PO TB24: 300.0000 mg | ORAL_TABLET | Freq: Every morning | ORAL | 0 refills | Status: DC

## 2021-01-29 NOTE — Telephone Encounter (Signed)
Rx sent 

## 2021-01-29 NOTE — Telephone Encounter (Signed)
Pt increased her dose of Wellbutrin at the last visit to 300mg  . The 150mg  was called in in early November, but she needs to have the new dose sent in to [facility] on December. See note below from 10/20: ((Increase Wellbutrin XL to 300 mg daily. She'll double the 150 mgs she has now, but at the next RF, she'll let me know if she is doing better on the 300 mg dose and need to send that in for 90 days))

## 2021-03-13 ENCOUNTER — Other Ambulatory Visit (HOSPITAL_COMMUNITY): Payer: Self-pay

## 2021-03-13 ENCOUNTER — Other Ambulatory Visit: Payer: Self-pay

## 2021-03-13 ENCOUNTER — Telehealth: Payer: Self-pay | Admitting: Physician Assistant

## 2021-03-13 MED ORDER — AMPHETAMINE-DEXTROAMPHETAMINE 30 MG PO TABS
30.0000 mg | ORAL_TABLET | Freq: Two times a day (BID) | ORAL | 0 refills | Status: DC
  Filled 2021-03-13: qty 60, 30d supply, fill #0

## 2021-03-13 NOTE — Telephone Encounter (Signed)
Pt called requesting Adderall Rx send to [facility]. [facility] out and Pt never received Dec. Rx.

## 2021-03-13 NOTE — Telephone Encounter (Signed)
Pended.

## 2021-03-14 ENCOUNTER — Other Ambulatory Visit (HOSPITAL_COMMUNITY): Payer: Self-pay

## 2021-03-23 ENCOUNTER — Other Ambulatory Visit: Payer: Self-pay

## 2021-03-23 ENCOUNTER — Encounter: Payer: Self-pay | Admitting: Physician Assistant

## 2021-03-23 ENCOUNTER — Other Ambulatory Visit (HOSPITAL_COMMUNITY): Payer: Self-pay

## 2021-03-23 ENCOUNTER — Ambulatory Visit: Payer: No Typology Code available for payment source | Admitting: Physician Assistant

## 2021-03-23 DIAGNOSIS — F988 Other specified behavioral and emotional disorders with onset usually occurring in childhood and adolescence: Secondary | ICD-10-CM | POA: Diagnosis not present

## 2021-03-23 DIAGNOSIS — F411 Generalized anxiety disorder: Secondary | ICD-10-CM

## 2021-03-23 DIAGNOSIS — F3181 Bipolar II disorder: Secondary | ICD-10-CM | POA: Diagnosis not present

## 2021-03-23 MED ORDER — BUPROPION HCL ER (XL) 300 MG PO TB24: 300.0000 mg | ORAL_TABLET | Freq: Every morning | ORAL | 3 refills | Status: DC

## 2021-03-23 MED ORDER — AMPHETAMINE-DEXTROAMPHETAMINE 30 MG PO TABS: 1.0000 | ORAL_TABLET | Freq: Two times a day (BID) | ORAL | 0 refills | Status: DC

## 2021-03-23 MED ORDER — AMPHETAMINE-DEXTROAMPHETAMINE 30 MG PO TABS
30.0000 mg | ORAL_TABLET | Freq: Two times a day (BID) | ORAL | 0 refills | Status: DC
  Filled 2021-04-24: qty 60, 30d supply, fill #0

## 2021-03-23 MED ORDER — AMPHETAMINE-DEXTROAMPHETAMINE 30 MG PO TABS
1.0000 | ORAL_TABLET | Freq: Two times a day (BID) | ORAL | 0 refills | Status: DC
  Filled 2021-05-29: qty 60, 30d supply, fill #0

## 2021-03-23 NOTE — Progress Notes (Signed)
[facility]  Patient ID: Tara Sullivan,  MRN: [id number]  PCP: Larkin Ina  Date of Evaluation: 03/23/2021 Time spent:20 minutes  Chief Complaint:  Chief Complaint   ADHD; Depression; Follow-up      HISTORY/CURRENT STATUS: HPI For routine med check.     Tara Sullivan is doing well.  She is able to enjoy things, she and her boyfriend got a new puppy and are enjoying having it around.  Energy and motivation are good.  She sleeps well most of the time and feels rested when she gets up.  Does not cry easily.  No suicidal or homicidal thoughts.  Work is going well.  Feels like it is a better fit than her previous job at [employer] u[employer].  She works in nephrology now.  There is still some stress of course but not like at the other place.  The [employer] of Adderall has made it more and more difficult for her to find it.  She was out for 3 weeks at 1 point and had a really hard time staying awake and focusing.  She was able to find it at [facility] and is doing much better since being back on it.  Denies dizziness, syncope, seizures, numbness, tingling, tremor, tics, unsteady gait, slurred speech, confusion. Denies muscle or joint pain, stiffness, or dystonia. Denies unexplained weight loss, frequent infections, or sores that heal slowly.  No polyphagia, polydipsia, or polyuria. Denies visual changes or paresthesias.   Individual Medical History/ Review of Systems: Changes? :No    Past medications for mental health diagnoses include: Zoloft, Seroquel, lithium, Latuda caused stomach pain, Wellbutrin, BuSpar, Pristiq, Klonopin, Prazosin x 1 night, then no longer needed, Strattera, Trileptal caused nausea, Lamictal didn't help.  Allergies: Latex and Trileptal [oxcarbazepine]  Current Medications:  Current Outpatient Medications:    Brexpiprazole 4 MG TABS, Take 1 tablet by mouth daily., Disp: 90 tablet, Rfl: 1   etonogestrel-ethinyl estradiol  (NUVARING) 0.12-0.015 MG/24HR vaginal ring, Insert 1 ring vaginally every 4 weeks, skipping placebo week (use continuously), Disp: 3 each, Rfl: 4   [START ON 06/08/2021] amphetamine-dextroamphetamine (ADDERALL) 30 MG tablet, Take 1 tablet by mouth 2 (two) times daily., Disp: 60 tablet, Rfl: 0   [START ON 05/10/2021] amphetamine-dextroamphetamine (ADDERALL) 30 MG tablet, Take 1 tablet by mouth 2 (two) times daily., Disp: 60 tablet, Rfl: 0   [START ON 04/13/2021] amphetamine-dextroamphetamine (ADDERALL) 30 MG tablet, Take 1 tablet by mouth 2 (two) times daily., Disp: 60 tablet, Rfl: 0   buPROPion (WELLBUTRIN XL) 300 MG 24 hr tablet, Take 1 tablet (300 mg total) by mouth every morning., Disp: 90 tablet, Rfl: 3   fluticasone (FLONASE) 50 MCG/ACT nasal spray, Place 2 sprays into both nostrils daily for 10 days., Disp: 16 g, Rfl: 0   Melatonin 3-10 MG TABS, Take 3-10 mg by mouth at bedtime as needed. (Patient not taking: Reported on 12/21/2020), Disp: 30 tablet, Rfl: 0   meloxicam (MOBIC) 15 MG tablet, TAKE 1 TABLET BY MOUTH ONCE A DAY AS NEEDED (DONT TAKE WITH IBUPROFEN OR NAPROXEN) (Patient not taking: Reported on 11/07/2020), Disp: 20 tablet, Rfl: 0 Medication Side Effects: none  decreased libido possibly med related??  Family Medical/ Social History: Changes?  No  MENTAL HEALTH EXAM:  There were no vitals taken for this visit.There is no height or weight on file to calculate BMI.  General Appearance: Casual, Neat, Well Groomed and Obese  Eye Contact:  Good  Speech:  Clear and Coherent and  Normal Rate  Volume:  Normal  Mood:  Euthymic  Affect:  Appropriate  Thought Process:  Goal Directed and Descriptions of Associations: Circumstantial  Orientation:  Full (Time, Place, and Person)  Thought Content: Logical   Suicidal Thoughts:  No  Homicidal Thoughts:  No  Memory:  WNL  Judgement:  Good  Insight:  Good  Psychomotor Activity:  Normal  Concentration:  Concentration: Good and Attention Span: Good   Recall:  Good  Fund of Knowledge: Good  Language: Good  Assets:  Desire for Improvement Financial Resources/Insurance Housing Transportation Vocational/Educational  ADL's:  Intact  Cognition: WNL  Prognosis:  Good    DIAGNOSES:    ICD-10-CM   1. Bipolar II disorder (HCC)  F31.81     2. 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 Generalized anxiety disorder  F41.1         Receiving Psychotherapy: No  has seen Kerri Perches in the past.    RECOMMENDATIONS:  PDMP was reviewed.  Last Adderall filled 03/14/2021.  I provided 20 minutes of face to face time during this encounter, including time spent before and after the visit in records review, medical decision making, counseling pertinent to today's visit, and charting.  We briefly discussed adzenys ER, instead of Adderall, only because of the [employer] of Adderall.  I explained that the [facility] ER has the same medication that is formulated differently and seems to work the same as the Adderall.  The only drawback is expense, but the drug company has a relationship with [facility] and another pharmacy in the state where they use a specific program making it less expensive.  She has been able to get Adderall at [facility] so would like to keep doing that for the time being.  If at any point she wants to change to [facility] ER, she can let me know. She is doing well so no changes will be made. Continue Wellbutrin XL 300 mg daily.  Continue Adderall 30 mg, 1 p.o. every morning and 1 around lunchtime. Continue Rexulti 4 mg daily. Return in 3 months.  Melony Overly, PA-C

## 2021-04-24 ENCOUNTER — Other Ambulatory Visit (HOSPITAL_COMMUNITY): Payer: Self-pay

## 2021-05-29 ENCOUNTER — Other Ambulatory Visit (HOSPITAL_COMMUNITY): Payer: Self-pay

## 2021-06-22 ENCOUNTER — Ambulatory Visit: Payer: No Typology Code available for payment source | Admitting: Physician Assistant

## 2021-06-22 ENCOUNTER — Encounter: Payer: Self-pay | Admitting: Physician Assistant

## 2021-06-22 ENCOUNTER — Other Ambulatory Visit (HOSPITAL_COMMUNITY): Payer: Self-pay

## 2021-06-22 DIAGNOSIS — F3181 Bipolar II disorder: Secondary | ICD-10-CM

## 2021-06-22 DIAGNOSIS — F988 Other specified behavioral and emotional disorders with onset usually occurring in childhood and adolescence: Secondary | ICD-10-CM

## 2021-06-22 DIAGNOSIS — Z566 Other physical and mental strain related to work: Secondary | ICD-10-CM | POA: Diagnosis not present

## 2021-06-22 MED ORDER — BREXPIPRAZOLE 4 MG PO TABS: 1.0000 | ORAL_TABLET | Freq: Every day | ORAL | 1 refills | Status: DC

## 2021-06-22 MED ORDER — AMPHETAMINE-DEXTROAMPHETAMINE 30 MG PO TABS
1.0000 | ORAL_TABLET | Freq: Two times a day (BID) | ORAL | 0 refills | Status: DC
  Filled 2021-07-05: qty 60, 30d supply, fill #0

## 2021-06-22 MED ORDER — AMPHETAMINE-DEXTROAMPHETAMINE 30 MG PO TABS
30.0000 mg | ORAL_TABLET | Freq: Two times a day (BID) | ORAL | 0 refills | Status: DC
  Filled 2021-08-30: qty 60, 30d supply, fill #0

## 2021-06-22 NOTE — Progress Notes (Signed)
[facility] ? ?Patient ID: Tara Sullivan,  ?MRN: [id number] ? ?PCP: Morrell Riddle, PA-C ? ?Date of Evaluation: 06/22/2021 ?Time spent:20 minutes ? ?Chief Complaint:  ?Chief Complaint   ?Anxiety; Depression; Follow-up ?  ? ? ?HISTORY/CURRENT STATUS: ?HPI For routine med check.    ? ?She is doing well with mental health.  She is not happy at her job though and is wondering whether healthcare is the right thing for her.  She is thinking about changing, but not sure yet.  She request a letter for her dog who is and emotional support for her.  This is only for times when she goes to visit other people and their complex requires a note. ? ?Patient denies loss of interest in usual activities and is able to enjoy things.  Denies decreased energy or motivation.  Appetite has not changed.  ADLs and personal hygiene are normal.  She is not isolating.  Does not cry easily.  No extreme sadness, tearfulness, or feelings of hopelessness.  Sleeps pretty well, not needing melatonin.  Denies suicidal or homicidal thoughts. ? ?Adderall is still effective. States that attention is good without easy distractibility.  Able to focus on things and finish tasks to completion.  ? ?Patient denies increased energy with decreased need for sleep, no increased talkativeness, no racing thoughts, no impulsivity or risky behaviors, no increased spending, no increased libido, no grandiosity, no increased irritability or anger, no paranoia, and no hallucinations. ? ?Denies dizziness, syncope, seizures, numbness, tingling, tremor, tics, unsteady gait, slurred speech, confusion. Denies muscle or joint pain, stiffness, or dystonia. Denies unexplained weight loss, frequent infections, or sores that heal slowly.  No polyphagia, polydipsia, or polyuria. Denies visual changes or paresthesias.  ? ?Individual Medical History/ Review of Systems: Changes? :No   ? ?Past medications for mental health diagnoses include: ?Zoloft, Seroquel, lithium,  Latuda caused stomach pain, Wellbutrin, BuSpar, Pristiq, Klonopin, Prazosin x 1 night, then no longer needed, Strattera, Trileptal caused nausea, Lamictal didn't help. ? ?Allergies: Latex and Trileptal [oxcarbazepine] ? ?Current Medications:  ?Current Outpatient Medications:  ?  amphetamine-dextroamphetamine (ADDERALL) 30 MG tablet, Take 1 tablet by mouth 2 (two) times daily., Disp: 60 tablet, Rfl: 0 ?  buPROPion (WELLBUTRIN XL) 300 MG 24 hr tablet, Take 1 tablet (300 mg total) by mouth every morning., Disp: 90 tablet, Rfl: 3 ?  etonogestrel-ethinyl estradiol (NUVARING) 0.12-0.015 MG/24HR vaginal ring, Insert 1 ring vaginally every 4 weeks, skipping placebo week (use continuously), Disp: 3 each, Rfl: 4 ?  [START ON 07/27/2021] amphetamine-dextroamphetamine (ADDERALL) 30 MG tablet, Take 1 tablet by mouth 2  times daily., Disp: 60 tablet, Rfl: 0 ?  amphetamine-dextroamphetamine (ADDERALL) 30 MG tablet, Take 1 tablet by mouth 2 times daily., Disp: 60 tablet, Rfl: 0 ?  Brexpiprazole 4 MG TABS, Take 1 tablet by mouth daily., Disp: 90 tablet, Rfl: 1 ?  fluticasone (FLONASE) 50 MCG/ACT nasal spray, Place 2 sprays into both nostrils daily for 10 days., Disp: 16 g, Rfl: 0 ?  Melatonin 3-10 MG TABS, Take 3-10 mg by mouth at bedtime as needed. (Patient not taking: Reported on 12/21/2020), Disp: 30 tablet, Rfl: 0 ?Medication Side Effects:  Decreased libido but not sure if only med related.    ? ?Family Medical/ Social History: Changes?  No ? ?MENTAL HEALTH EXAM: ? ?There were no vitals taken for this visit.There is no height or weight on file to calculate BMI.  ?General Appearance: Casual, Neat, Well Groomed and Obese  ?Eye Contact:  Good  ?  Speech:  Clear and Coherent and Normal Rate  ?Volume:  Normal  ?Mood:  Euthymic  ?Affect:  Appropriate  ?Thought Process:  Goal Directed and Descriptions of Associations: Intact  ?Orientation:  Full (Time, Place, and Person)  ?Thought Content: Logical   ?Suicidal Thoughts:  No  ?Homicidal  Thoughts:  No  ?Memory:  WNL  ?Judgement:  Good  ?Insight:  Good  ?Psychomotor Activity:  Normal  ?Concentration:  Concentration: Good and Attention Span: Good  ?Recall:  Good  ?Fund of Knowledge: Good  ?Language: Good  ?Assets:  Desire for Improvement  ?ADL's:  Intact  ?Cognition: WNL  ?Prognosis:  Good  ? ? ?DIAGNOSES:  ?  ICD-10-CM   ?1. Bipolar II disorder (HCC)  F31.81   ?  ?2. 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  ?3. Work stress  Z56.6   ?  ? ? ?Receiving Psychotherapy: No  has seen Kerri Perches in the past.  ? ? ?RECOMMENDATIONS:  ?PDMP was reviewed.  Last Adderall filled 05/29/2021. ?I provided 20 minutes of face to face time during this encounter, including time spent before and after the visit in records review, medical decision making, counseling pertinent to today's visit, and charting.  ?She is doing well as far as her medications are concerned so no changes will be made. ? ?Letter for emotional support dog will be written and she will be called to pick it up when ready.  ?Continue Wellbutrin XL 300 mg daily.  ?Continue Adderall 30 mg, 1 p.o. every morning and 1 around lunchtime. ?Continue Rexulti 4 mg daily. ?Return in 6 months. ? ?Melony Overly, PA-C  ?

## 2021-06-29 ENCOUNTER — Encounter: Payer: Self-pay | Admitting: Physician Assistant

## 2021-07-05 ENCOUNTER — Other Ambulatory Visit (HOSPITAL_COMMUNITY): Payer: Self-pay

## 2021-08-30 ENCOUNTER — Other Ambulatory Visit (HOSPITAL_COMMUNITY): Payer: Self-pay

## 2021-09-13 ENCOUNTER — Telehealth: Payer: Self-pay

## 2021-09-13 NOTE — Telephone Encounter (Signed)
Prior Authorization submitted for REXULTI 4 MG with [employer] [facility], approval received effective through 09/14/2022 [id number]

## 2021-10-31 ENCOUNTER — Other Ambulatory Visit: Payer: Self-pay | Admitting: Physician Assistant

## 2021-10-31 ENCOUNTER — Other Ambulatory Visit (HOSPITAL_COMMUNITY): Payer: Self-pay

## 2021-11-01 MED ORDER — AMPHETAMINE-DEXTROAMPHETAMINE 30 MG PO TABS
30.0000 mg | ORAL_TABLET | Freq: Two times a day (BID) | ORAL | 0 refills | Status: DC
  Filled 2021-11-01: qty 60, 30d supply, fill #0

## 2021-11-02 ENCOUNTER — Other Ambulatory Visit (HOSPITAL_COMMUNITY): Payer: Self-pay

## 2021-12-14 ENCOUNTER — Ambulatory Visit: Payer: No Typology Code available for payment source | Admitting: Physician Assistant

## 2022-01-04 ENCOUNTER — Ambulatory Visit: Payer: No Typology Code available for payment source | Admitting: Physician Assistant

## 2022-01-21 ENCOUNTER — Other Ambulatory Visit (HOSPITAL_COMMUNITY): Payer: Self-pay

## 2022-01-21 ENCOUNTER — Other Ambulatory Visit: Payer: Self-pay | Admitting: Physician Assistant

## 2022-01-22 ENCOUNTER — Other Ambulatory Visit (HOSPITAL_COMMUNITY): Payer: Self-pay

## 2022-01-22 MED ORDER — AMPHETAMINE-DEXTROAMPHETAMINE 30 MG PO TABS
30.0000 mg | ORAL_TABLET | Freq: Two times a day (BID) | ORAL | 0 refills | Status: DC
  Filled 2022-01-22: qty 60, 30d supply, fill #0

## 2022-01-22 NOTE — Telephone Encounter (Signed)
Filled 9/1

## 2022-01-23 ENCOUNTER — Other Ambulatory Visit (HOSPITAL_COMMUNITY): Payer: Self-pay

## 2022-02-04 ENCOUNTER — Ambulatory Visit (INDEPENDENT_AMBULATORY_CARE_PROVIDER_SITE_OTHER): Payer: 59 | Admitting: Physician Assistant

## 2022-02-04 ENCOUNTER — Encounter: Payer: Self-pay | Admitting: Physician Assistant

## 2022-02-04 ENCOUNTER — Other Ambulatory Visit (HOSPITAL_COMMUNITY): Payer: Self-pay

## 2022-02-04 DIAGNOSIS — F3181 Bipolar II disorder: Secondary | ICD-10-CM | POA: Diagnosis not present

## 2022-02-04 DIAGNOSIS — F411 Generalized anxiety disorder: Secondary | ICD-10-CM

## 2022-02-04 DIAGNOSIS — G479 Sleep disorder, unspecified: Secondary | ICD-10-CM | POA: Diagnosis not present

## 2022-02-04 DIAGNOSIS — F988 Other specified behavioral and emotional disorders with onset usually occurring in childhood and adolescence: Secondary | ICD-10-CM

## 2022-02-04 MED ORDER — AMPHETAMINE-DEXTROAMPHETAMINE 30 MG PO TABS: 1.0000 | ORAL_TABLET | Freq: Two times a day (BID) | ORAL | 0 refills | Status: DC

## 2022-02-04 MED ORDER — AMPHETAMINE-DEXTROAMPHETAMINE 30 MG PO TABS
1.0000 | ORAL_TABLET | Freq: Two times a day (BID) | ORAL | 0 refills | Status: DC
  Filled 2022-04-03: qty 60, 30d supply, fill #0

## 2022-02-04 MED ORDER — BREXPIPRAZOLE 4 MG PO TABS: 1.0000 | ORAL_TABLET | Freq: Every day | ORAL | 3 refills | Status: DC

## 2022-02-04 MED ORDER — AMPHETAMINE-DEXTROAMPHETAMINE 30 MG PO TABS
30.0000 mg | ORAL_TABLET | Freq: Two times a day (BID) | ORAL | 0 refills | Status: DC
  Filled 2022-06-17: qty 60, 30d supply, fill #0

## 2022-02-04 NOTE — Progress Notes (Signed)
[facility]  Patient ID: Tara Sullivan,  MRN: [id number]  PCP: Tara Riddle, PA-C  Date of Evaluation: 02/04/2022 Time spent:20 minutes  Chief Complaint:  Chief Complaint   ADD; Depression; Follow-up    HISTORY/CURRENT STATUS: HPI For routine med check.     She is doing well as far as her medications go.  She is still working at [employer].  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  In the meantime she has to keep working.   Patient is able to enjoy things.  Energy and motivation are good.  No extreme sadness, tearfulness, or feelings of hopelessness.  Sleeps well most of the time, she does wake up a lot but is able to go back to sleep.  Does not snore.  Does not gasp for air.. ADLs and personal hygiene are normal.   Denies any changes in concentration, making decisions, or remembering things.  Appetite has not changed.  Weight is stable.  Not having a lot of anxiety.  Denies suicidal or homicidal thoughts.  Patient denies increased energy with decreased need for sleep, increased talkativeness, racing thoughts, impulsivity or risky behaviors, increased spending, increased libido, grandiosity, increased irritability or anger, paranoia, or hallucinations.  Denies dizziness, syncope, seizures, numbness, tingling, tremor, tics, unsteady gait, slurred speech, confusion. Denies muscle or joint pain, stiffness, or dystonia. Denies unexplained weight loss, frequent infections, or sores that heal slowly.  No polyphagia, polydipsia, or polyuria. Denies visual changes or paresthesias.   Individual Medical History/ Review of Systems: Changes? :No    Past medications for mental health diagnoses include: Zoloft, Seroquel, lithium, Latuda caused stomach pain, Wellbutrin, BuSpar, Pristiq, Klonopin, Prazosin x 1 night, then no longer needed, Strattera, Trileptal caused nausea, Lamictal didn't help.  Allergies: Latex and Trileptal  [oxcarbazepine]  Current Medications:  Current Outpatient Medications:    buPROPion (WELLBUTRIN XL) 300 MG 24 hr tablet, Take 1 tablet (300 mg total) by mouth every morning., Disp: 90 tablet, Rfl: 3   [START ON 02/20/2022] amphetamine-dextroamphetamine (ADDERALL) 30 MG tablet, Take 1 tablet by mouth 2 (two) times daily., Disp: 60 tablet, Rfl: 0   [START ON 03/22/2022] amphetamine-dextroamphetamine (ADDERALL) 30 MG tablet, Take 1 tablet by mouth 2 (two) times daily., Disp: 60 tablet, Rfl: 0   [START ON 04/20/2022] amphetamine-dextroamphetamine (ADDERALL) 30 MG tablet, Take 1 tablet by mouth 2 (two) times daily., Disp: 60 tablet, Rfl: 0   Brexpiprazole 4 MG TABS, Take 1 tablet by mouth daily., Disp: 90 tablet, Rfl: 3   etonogestrel-ethinyl estradiol (NUVARING) 0.12-0.015 MG/24HR vaginal ring, Insert 1 ring vaginally every 4 weeks, skipping placebo week (use continuously), Disp: 3 each, Rfl: 4   fluticasone (FLONASE) 50 MCG/ACT nasal spray, Place 2 sprays into both nostrils daily for 10 days., Disp: 16 g, Rfl: 0   Melatonin 3-10 MG TABS, Take 3-10 mg by mouth at bedtime as needed. (Patient not taking: Reported on 12/21/2020), Disp: 30 tablet, Rfl: 0 Medication Side Effects:  Decreased libido but not sure if only med related.     Family Medical/ Social History: Changes?  She and her boyfriend have moved in together.  MENTAL HEALTH EXAM:  There were no vitals taken for this visit.There is no height or weight on file to calculate BMI.  General Appearance: Casual, Neat, Well Groomed and Obese  Eye Contact:  Good  Speech:  Clear and Coherent and Normal Rate  Volume:  Normal  Mood:  Euthymic  Affect:  Appropriate  Thought Process:  Goal Directed and Descriptions of Associations: Intact  Orientation:  Full (Time, Place, and Person)  Thought Content: Logical   Suicidal Thoughts:  No  Homicidal Thoughts:  No  Memory:  WNL  Judgement:  Good  Insight:  Good  Psychomotor Activity:  Normal   Concentration:  Concentration: Good and Attention Span: Good  Recall:  Good  Fund of Knowledge: Good  Language: Good  Assets:  Desire for Improvement Financial Resources/Insurance Housing Transportation Vocational/Educational  ADL's:  Intact  Cognition: WNL  Prognosis:  Good   DIAGNOSES:    ICD-10-CM   1. Bipolar II disorder (HCC)  F31.81     2. 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 Generalized anxiety disorder  F41.1     4. Sleep disturbance  G47.9       Receiving Psychotherapy: No  has seen Tara Sullivan in the past.   RECOMMENDATIONS:  PDMP was reviewed.  Last Adderall filled 01/22/2022. I provided 20 minutes of face to face time during this encounter, including time spent before and after the visit in records review, medical decision making, counseling pertinent to today's visit, and charting.   She is doing well as far as her medications go so no changes will be made. Sleep hygiene discussed. She has new insurance as of August of this year, states the Rexulti will not be covered next year.  I will send in her new prescription to initiate a PA process. She will have labs drawn at her GYN appointment next month and will ask that those labs be sent to me.  She understands the necessity due to the Rexulti possibly increasing glucose and lipids.  Continue Adderall 30 mg, 1 p.o. every morning and 1 around lunchtime. Continue Rexulti 4 mg, 1 p.o. daily. Continue Wellbutrin XL 300 mg daily.  Return in 6 months.  Tara Overly, PA-C

## 2022-02-12 ENCOUNTER — Telehealth: Payer: Self-pay | Admitting: Physician Assistant

## 2022-02-12 NOTE — Telephone Encounter (Signed)
Pt called in at 2pm today. She states that Rexulti needs a PA thru her insurance.

## 2022-02-12 NOTE — Telephone Encounter (Signed)
Noted will submit to [employer] for Rexulti 4 mg

## 2022-02-12 NOTE — Telephone Encounter (Signed)
Prior Approval received effective 02/12/2022-03/03/2098, PA # [id number]

## 2022-04-03 ENCOUNTER — Other Ambulatory Visit (HOSPITAL_COMMUNITY): Payer: Self-pay

## 2022-04-04 ENCOUNTER — Other Ambulatory Visit (HOSPITAL_COMMUNITY): Payer: Self-pay

## 2022-05-09 ENCOUNTER — Other Ambulatory Visit: Payer: Self-pay | Admitting: Physician Assistant

## 2022-06-03 ENCOUNTER — Telehealth: Payer: Self-pay | Admitting: Physician Assistant

## 2022-06-03 NOTE — Telephone Encounter (Signed)
I recommend Monique Crutchfield, Dr. Claretta Fraise, Dr. Ernestene Mention, Carlynn Spry, [place], [facility], [facility]

## 2022-06-03 NOTE — Telephone Encounter (Signed)
Tara Sullivan called at 2:30 to ask if you have a referral for an counselor.  Please advise.  Next appt 6/7

## 2022-06-17 ENCOUNTER — Other Ambulatory Visit (HOSPITAL_COMMUNITY): Payer: Self-pay

## 2022-06-19 ENCOUNTER — Other Ambulatory Visit (HOSPITAL_COMMUNITY): Payer: Self-pay

## 2022-07-05 ENCOUNTER — Encounter: Payer: Self-pay | Admitting: Behavioral Health

## 2022-07-05 ENCOUNTER — Ambulatory Visit (INDEPENDENT_AMBULATORY_CARE_PROVIDER_SITE_OTHER): Payer: 59 | Admitting: Behavioral Health

## 2022-07-05 DIAGNOSIS — F411 Generalized anxiety disorder: Secondary | ICD-10-CM | POA: Diagnosis not present

## 2022-07-05 DIAGNOSIS — F3181 Bipolar II disorder: Secondary | ICD-10-CM

## 2022-07-05 DIAGNOSIS — F900 Attention-deficit hyperactivity disorder, predominantly inattentive type: Secondary | ICD-10-CM
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
## 2022-07-05 NOTE — Progress Notes (Signed)
                Felise Georgia M Kanton Kamel, LCMHC 

## 2022-07-26 ENCOUNTER — Ambulatory Visit: Payer: 59 | Admitting: Behavioral Health

## 2022-08-05 ENCOUNTER — Other Ambulatory Visit: Payer: Self-pay | Admitting: Physician Assistant

## 2022-08-09 ENCOUNTER — Encounter: Payer: Self-pay | Admitting: Physician Assistant

## 2022-08-09 ENCOUNTER — Other Ambulatory Visit (HOSPITAL_COMMUNITY): Payer: Self-pay

## 2022-08-09 ENCOUNTER — Ambulatory Visit (INDEPENDENT_AMBULATORY_CARE_PROVIDER_SITE_OTHER): Payer: 59 | Admitting: Behavioral Health

## 2022-08-09 ENCOUNTER — Ambulatory Visit: Payer: 59 | Admitting: Physician Assistant

## 2022-08-09 DIAGNOSIS — Z566 Other physical and mental strain related to work: Secondary | ICD-10-CM

## 2022-08-09 DIAGNOSIS — F411 Generalized anxiety disorder: Secondary | ICD-10-CM

## 2022-08-09 DIAGNOSIS — F900 Attention-deficit hyperactivity disorder, predominantly inattentive type: Secondary | ICD-10-CM

## 2022-08-09 DIAGNOSIS — Z79899 Other long term (current) drug therapy: Secondary | ICD-10-CM | POA: Diagnosis not present

## 2022-08-09 DIAGNOSIS — F3181 Bipolar II disorder: Secondary | ICD-10-CM

## 2022-08-09 MED ORDER — AMPHETAMINE-DEXTROAMPHETAMINE 30 MG PO TABS
30.0000 mg | ORAL_TABLET | Freq: Two times a day (BID) | ORAL | 0 refills | Status: DC
  Filled 2022-08-09 – 2022-10-08 (×2): qty 60, 30d supply, fill #0

## 2022-08-09 MED ORDER — AMPHETAMINE-DEXTROAMPHETAMINE 30 MG PO TABS: 1.0000 | ORAL_TABLET | Freq: Two times a day (BID) | ORAL | 0 refills | Status: DC

## 2022-08-09 NOTE — Progress Notes (Unsigned)
[facility] Med Check  Patient ID: Tara Sullivan,  MRN: [id number]  PCP: Morrell Riddle, PA-C  Date of Evaluation: 08/09/2022 Time spent:30 minutes  Chief Complaint:  Chief Complaint   ADD; Depression; Follow-up    HISTORY/CURRENT STATUS: HPI For routine med check.     Is doing well. Patient is able to enjoy things.  Energy and motivation are good.  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  No extreme sadness, tearfulness, or feelings of hopelessness.  Sleeps well most of the time. ADLs and personal hygiene are normal.   Denies any changes in remembering things.  Appetite has not changed.  Weight is stable. No anxiety out of the ordinary and keeping with the situation.   Denies suicidal or homicidal thoughts.  States that attention is good without easy distractibility.  Able to focus on things and finish tasks to completion.   Patient denies increased energy with decreased need for sleep, increased talkativeness, racing thoughts, impulsivity or risky behaviors, increased spending, increased libido, grandiosity, increased irritability or anger, paranoia, or hallucinations.  Denies dizziness, syncope, seizures, numbness, tingling, tremor, tics, unsteady gait, slurred speech, confusion. Denies muscle or joint pain, stiffness, or dystonia. Denies unexplained weight loss, frequent infections, or sores that heal slowly.  No polyphagia, polydipsia, or polyuria. Denies visual changes or paresthesias.   Individual Medical History/ Review of Systems: Changes? :No    Past medications for mental health diagnoses include: Zoloft, Seroquel, lithium, Latuda caused stomach pain, Wellbutrin, BuSpar, Pristiq, Klonopin, Prazosin x 1 night, then no longer needed, Strattera, Trileptal caused nausea, Lamictal didn't help.  Allergies: Latex and Trileptal [oxcarbazepine]  Current Medications:  Current Outpatient Medications:    Brexpiprazole 4 MG TABS, Take 1 tablet by mouth  daily., Disp: 90 tablet, Rfl: 3   buPROPion (WELLBUTRIN XL) 300 MG 24 hr tablet, TAKE 1 TABLET BY MOUTH ONCE DAILY IN THE MORNING, Disp: 90 tablet, Rfl: 0   amphetamine-dextroamphetamine (ADDERALL) 30 MG tablet, Take 1 tablet by mouth 2 times daily., Disp: 60 tablet, Rfl: 0   [START ON 09/07/2022] amphetamine-dextroamphetamine (ADDERALL) 30 MG tablet, Take 1 tablet by mouth 2 (two) times daily., Disp: 60 tablet, Rfl: 0   [START ON 10/07/2022] amphetamine-dextroamphetamine (ADDERALL) 30 MG tablet, Take 1 tablet by mouth 2 (two) times daily., Disp: 60 tablet, Rfl: 0   etonogestrel-ethinyl estradiol (NUVARING) 0.12-0.015 MG/24HR vaginal ring, Insert 1 ring vaginally every 4 weeks, skipping placebo week (use continuously), Disp: 3 each, Rfl: 4   fluticasone (FLONASE) 50 MCG/ACT nasal spray, Place 2 sprays into both nostrils daily for 10 days., Disp: 16 g, Rfl: 0   Melatonin 3-10 MG TABS, Take 3-10 mg by mouth at bedtime as needed. (Patient not taking: Reported on 12/21/2020), Disp: 30 tablet, Rfl: 0 Medication Side Effects:  Decreased libido but not sure if only med related.     Family Medical/ Social History: Changes?  She and her boyfriend have moved in together.  MENTAL HEALTH EXAM:  There were no vitals taken for this visit.There is no height or weight on file to calculate BMI.  General Appearance: Casual, Neat, Well Groomed and Obese  Eye Contact:  Good  Speech:  Clear and Coherent and Normal Rate  Volume:  Normal  Mood:  Euthymic  Affect:  Appropriate  Thought Process:  Goal Directed and Descriptions of Associations: Intact  Orientation:  Full (Time, Place, and Person)  Thought Content: Logical   Suicidal Thoughts:  No  Homicidal Thoughts:  No  Memory:  WNL  Judgement:  Good  Insight:  Good  Psychomotor Activity:  Normal  Concentration:  Concentration: Good and Attention Span: Good  Recall:  Good  Fund of Knowledge: Good  Language: Good  Assets:  Desire for Improvement Financial  Resources/Insurance Housing Resilience Transportation Vocational/Educational  ADL's:  Intact  Cognition: WNL  Prognosis:  Good   DIAGNOSES:    ICD-10-CM   1. Bipolar II disorder (HCC)  F31.81 Comprehensive metabolic panel    Hemoglobin A1c    Lipid panel    2. Encounter for long-term (current) use of medications  Z79.899 Comprehensive metabolic panel    Hemoglobin A1c    Lipid panel    3. Attention deficit hyperactivity disorder (ADHD), predominantly inattentive type  F90.0     4. Generalized anxiety disorder  F41.1     5. Work stress  Z56.6      Receiving Psychotherapy: No  has seen Kerri Perches in the past.   RECOMMENDATIONS:  PDMP was reviewed.  Last Adderall filled 06/17/2022. I provided 30 minutes of face to face time during this encounter, including time spent before and after the visit in records review, medical decision making, counseling pertinent to today's visit, and charting.   She's doing well so no changes need to be made.   Continue Adderall 30 mg, 1 p.o. every morning and 1 around lunchtime. Continue Rexulti 4 mg, 1 p.o. daily. Continue Wellbutrin XL 300 mg daily.  Fasting labs as ordered above. Return in 6 months.  Melony Overly, PA-C

## 2022-08-10 ENCOUNTER — Encounter: Payer: Self-pay | Admitting: Behavioral Health

## 2022-08-10 NOTE — Progress Notes (Signed)
                Mckenzee Beem M Deanglo Hissong, LCMHC 

## 2022-08-10 NOTE — Progress Notes (Signed)
[facility] Counselor/Therapist Progress Note  Patient ID: Tara Sullivan, MRN: [id number],    Date: 08/10/2022  Time Spent: 56 minutes  Treatment Type: Individual Therapy  Reported Symptoms: Anxiety, depression  Mental Status Exam: Appearance:  Well Groomed     Behavior: Appropriate  Motor: Normal  Speech/Language:  Negative  Affect: Appropriate  Mood: normal  Thought process: normal  Thought content:   WNL  Sensory/Perceptual disturbances:   WNL  Orientation: oriented to person, place, time/date, situation, day of week, month of year, and year  Attention: Good  Concentration: Good  Memory: WNL  Fund of knowledge:  Good  Insight:   Good  Judgment:  Good  Impulse Control: Good   Risk Assessment: Danger to Self:  No Self-injurious Behavior: No Danger to Others: No Duty to Warn:no Physical Aggression / Violence:No  Access to Firearms a concern: No  Gang Involvement:No   Subjective: The patient reports little change in mood over the past few weeks.  She did report that she had to have a teeth removed urgently so she knew would not be covered by insurance.  There was already some financial strain and she knows that will add to it.  We talked about the importance of compartmentalization but also the practical aspect of seeing if it could be finance was not overwhelming.  She does live with her boyfriend and boyfriend's mother for the most part she and her boyfriend split bills so they are doing as well as they can.  There is still significant stress at work.  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 after awaiting doctor's responses to them.  She estimates that's several her day to over 20/day that she is responsible for and that is above and beyond her regular duties.  She acknowledges not loving what she is doing and experiencing significant stress from her job.  She is not sure  what she would like to do and we will talk about that more in future sessions.  She also says she struggles with self-confidence and has a lot of that at least professionally back to when Dr. Who told her that she asked too many questions.  We talked about challenging those thoughts from her past that affect to ER today and we looked at how she is in relationship to her practice now.  She says that she has 1 doctor in particular who always requested to work with her and starts there today with a hug.  She loves working with that physician but all of the other physicians in this practice had very positive things to say about her.  We talked about seeing her asking questions as a way to learn and strengthen her skill set.  We also introduced some additional coping skills including the tips skill as well as progressive muscle relaxation as a part of the tips skill.  She estimates that her depression is moderate and her anxiety is about moderate at this point.  Interventions: Cognitive Behavioral Therapy and Dialectical Behavioral Therapy  Diagnosis: Bipolar 2 disorder  Plan: I will meet with the patient every 2 to 3 weeks via video session. Plan of CARE: We will use cognitive behavioral therapy as well as elements of dialectical behavior therapy and person centered therapy to reduce the patient's anxiety and depression by at least 50% by February 01, 2023.  Goals will be for the patient to have less depression especially as  it relates to work and indicated by patient report and by PHQ-9 scores.  We also will look at improving mood and returning to a healthier level of functioning which includes identifying causes for depressed mood and learning ways to cope with depression.  Interventions will be using cognitive behavioral therapy to explore and replace unhealthy thoughts and behavior patterns contributing to depression, teach and encouraged use of coping skills for management of depressive symptoms.  Goals  for improving anxiety include improving the patient's ability to manage anxiety symptoms and stress especially at work related, identify any other additional causes for anxiety and explore ways to reduce it as well as beginning to and continuing to resolving core conflicts contributing to his cause of anxiety.  The final goal is to manage thoughts and worrisome thinking contributing to anxiety.  Interventions include providing education about the anxiety to help her understand its causes symptoms and triggers, teach coping skills for managing and reducing anxiety such as guided imagery meditation, grounding exercises etc.  We will also use CBT to identify and change anxiety provoking thoughts and behavior patterns as well as dialectical behavior therapy distress tolerance and mindfulness skills to help manage anxiety symptoms. [name], [facility]

## 2022-08-11 ENCOUNTER — Encounter: Payer: Self-pay | Admitting: Physician Assistant

## 2022-08-19 ENCOUNTER — Telehealth: Payer: Self-pay

## 2022-08-19 ENCOUNTER — Other Ambulatory Visit (HOSPITAL_COMMUNITY): Payer: Self-pay

## 2022-08-19 NOTE — Telephone Encounter (Signed)
Received PA request through [facility] for REXULTI 4 MG with [facility], they responded that they do not review her PA's, checked back of pt card and submitted PA through [facility] and they respond with NO PA needed pt has access to medication.

## 2022-08-30 ENCOUNTER — Ambulatory Visit: Payer: 59 | Admitting: Behavioral Health

## 2022-09-20 ENCOUNTER — Ambulatory Visit: Payer: 59 | Admitting: Behavioral Health

## 2022-09-26 ENCOUNTER — Ambulatory Visit (INDEPENDENT_AMBULATORY_CARE_PROVIDER_SITE_OTHER): Payer: 59 | Admitting: Behavioral Health

## 2022-09-26 ENCOUNTER — Encounter: Payer: Self-pay | Admitting: Behavioral Health

## 2022-09-26 DIAGNOSIS — F3181 Bipolar II disorder: Secondary | ICD-10-CM

## 2022-09-26 DIAGNOSIS — F411 Generalized anxiety disorder: Secondary | ICD-10-CM

## 2022-09-26 DIAGNOSIS — F331 Major depressive disorder, recurrent, moderate: Secondary | ICD-10-CM

## 2022-09-26 NOTE — Progress Notes (Signed)
[facility] Counselor/Therapist Progress Note  Patient ID: Tara Sullivan, MRN: [id number],    Date: 09/26/22  Time Spent: 56, 301 to 3:56 PM.  Minutes via video session.This session was held via video teletherapy. The patient consented to the video teletherapy and was located in her home during this session. She is aware it is the responsibility of the patient to secure confidentiality on her end of the session. The provider was in a private home office for the duration of this session.      Treatment Type: Individual Therapy  Reported Symptoms: Anxiety, depression  Mental Status Exam: Appearance:  Well Groomed     Behavior: Appropriate  Motor: Normal  Speech/Language:  Negative  Affect: Appropriate  Mood: normal  Thought process: normal  Thought content:   WNL  Sensory/Perceptual disturbances:   WNL  Orientation: oriented to person, place, time/date, situation, day of week, month of year, and year  Attention: Good  Concentration: Good  Memory: WNL  Fund of knowledge:  Good  Insight:   Good  Judgment:  Good  Impulse Control: Good   Risk Assessment: Danger to Self:  No Self-injurious Behavior: No Danger to Others: No Duty to Warn:no Physical Aggression / Violence:No  Access to Firearms a concern: No  Gang Involvement:No   Subjective: The patient reports moderate depression.  She says that when she is on her medication that is better but she has not had her medication for a week.  It is ready at the pharmacy but she says she has not had the motivation and energy or time to go get it.  I encouraged her to do that quickly and get back on the medication.  She does still contract for safety but presents with fairly flat affect.  We started breaking down where most of the stress in her life is an 75% of it comes from work.  They are fairly seriously understaffed.  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  She said there is nothing much that they could really take away from her responsibilities.  We started talk about what she would like to do if she could do anything she wanted to and she would love to work with animals.  We talked about what that would look like.  There is also significant financial stress.  Bills are supposed to be split evenly between she and her boyfriend and boyfriend's mother but the patient says she is short every month and has accumulated some credit card debt.  She is paying it back consistently but she says at times she wakes up in the middle of the night dreaming about it and stressed about it.  Her boyfriend has not offered to help offset and that is why encouraged her to speak with him about at least taking on another bill so that she would have the freedom to pay that now more.  Her boyfriend's mother is supposed about all of the groceries but typically does not.  Part of it is that she does not know what to buy or how much to buy.  Ask if her boyfriend's mother would sit down with her and make out meal plans and/or budgets for meals and she said her boyfriend would not that it was her responsibility.  She said he boyfriend's mother may help but her mother would help more so I encouraged her to spend a few minutes a week with  her mom planning out their meal so that he could  have enough food in the house and managed her food budget better.  She also talked about things that she does to cope.  Her boyfriend wants to sit around most the time and he will do things but then makes her feel as if she is forcing him to.  I encouraged her to look at what the balance has between doing things with her mom or friend and getting out so that she does not sit around the house all the time also.  She does contract for safety having no thoughts of hurting herself or anyone else. Interventions: Cognitive Behavioral Therapy and Dialectical Behavioral Therapy  Diagnosis: Bipolar 2  disorder  Plan: I will meet with the patient every 2 to 3 weeks via video session. Plan of CARE: We will use cognitive behavioral therapy as well as elements of dialectical behavior therapy and person centered therapy to reduce the patient's anxiety and depression by at least 50% by February 01, 2023.  Goals will be for the patient to have less depression especially as it relates to work and indicated by patient report and by PHQ-9 scores.  We also will look at improving mood and returning to a healthier level of functioning which includes identifying causes for depressed mood and learning ways to cope with depression.  Interventions will be using cognitive behavioral therapy to explore and replace unhealthy thoughts and behavior patterns contributing to depression, teach and encouraged use of coping skills for management of depressive symptoms.  Goals for improving anxiety include improving the patient's ability to manage anxiety symptoms and stress especially at work related, identify any other additional causes for anxiety and explore ways to reduce it as well as beginning to and continuing to resolving core conflicts contributing to his cause of anxiety.  The final goal is to manage thoughts and worrisome thinking contributing to anxiety.  Interventions include providing education about the anxiety to help her understand its causes symptoms and triggers, teach coping skills for managing and reducing anxiety such as guided imagery meditation, grounding exercises etc.  We will also use CBT to identify and change anxiety provoking thoughts and behavior patterns as well as dialectical behavior therapy distress tolerance and mindfulness skills to help manage anxiety symptoms. Progress: 25% [name], [facility]                  [name], [facility]

## 2022-10-08 ENCOUNTER — Other Ambulatory Visit (HOSPITAL_COMMUNITY): Payer: Self-pay

## 2022-10-14 ENCOUNTER — Encounter: Payer: Self-pay | Admitting: Behavioral Health

## 2022-10-14 ENCOUNTER — Ambulatory Visit (INDEPENDENT_AMBULATORY_CARE_PROVIDER_SITE_OTHER): Payer: BC Managed Care – PPO | Admitting: Behavioral Health

## 2022-10-14 DIAGNOSIS — F411 Generalized anxiety disorder: Secondary | ICD-10-CM

## 2022-10-14 DIAGNOSIS — F3181 Bipolar II disorder: Secondary | ICD-10-CM | POA: Diagnosis not present

## 2022-10-14 DIAGNOSIS — F331 Major depressive disorder, recurrent, moderate: Secondary | ICD-10-CM
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
## 2022-10-16 ENCOUNTER — Other Ambulatory Visit (HOSPITAL_COMMUNITY): Payer: Self-pay

## 2022-10-31 ENCOUNTER — Ambulatory Visit: Payer: BC Managed Care – PPO | Admitting: Behavioral Health

## 2022-10-31 ENCOUNTER — Encounter: Payer: Self-pay | Admitting: Behavioral Health

## 2022-10-31 DIAGNOSIS — F3181 Bipolar II disorder: Secondary | ICD-10-CM

## 2022-10-31 DIAGNOSIS — F331 Major depressive disorder, recurrent, moderate: Secondary | ICD-10-CM

## 2022-10-31 DIAGNOSIS — F411 Generalized anxiety disorder: Secondary | ICD-10-CM

## 2022-10-31 NOTE — Progress Notes (Addendum)
[facility] Counselor/Therapist Progress Note  Patient ID: Catricia Wilkinson, MRN: [id number],    Date: 10/31/22  Time Spent: 53 minutes, 3 PM until 3:53 PM via video session.This session was held via video teletherapy. The patient consented to the video teletherapy and was located in her home during this session. She is aware it is the responsibility of the patient to secure confidentiality on her end of the session. The provider was in a private home office for the duration of this session.      Treatment Type: Individual Therapy  Reported Symptoms: Anxiety, depression  Mental Status Exam: Appearance:  Well Groomed     Behavior: Appropriate  Motor: Normal  Speech/Language:  Negative  Affect: Appropriate  Mood: normal  Thought process: normal  Thought content:   WNL  Sensory/Perceptual disturbances:   WNL  Orientation: oriented to person, place, time/date, situation, day of week, month of year, and year  Attention: Good  Concentration: Good  Memory: WNL  Fund of knowledge:  Good  Insight:   Good  Judgment:  Good  Impulse Control: Good   Risk Assessment: Danger to Self:  No Self-injurious Behavior: No Danger to Others: No Duty to Warn:no Physical Aggression / Violence:No  Access to Firearms a concern: No  Gang Involvement:No   Subjective: The patient is experiencing beginning "blah" primarily at work.  She has not taken any of her medication because she would like to take a break from taking medication.  She estimates to spend 3 or more weeks since she has had any but she has not noticed a significant difference but still reports the lack of energy and motivation.  I encouraged her to speak to her doctor about the fact that she has come off of medication.  We will continue to address ways that she can alleviate her anxiety and depression from a therapy and coping skills perspective.  She likes who she works for and with but is not enjoying her work.  She is  almost dreading calling patient's back saying that she does not want to be on the phone very long.  I encouraged her to think about why she wants to stay at what she might want to do asking herself what she could do if she had no restrictions on money time education etc.  I challenged her to look up a certain career on one of the job search engines to see if it would be comparable to her pay and involve less interaction with people possibly being able to work at home.  She also has been struggling somewhat with self-care saying that showering or washing her hair drying her hair is somewhat of a chore.  Part of it is that she wants to spend more time with her boyfriend in the evening as  she works first shift and he works third.  We also looked at how they spend time together which led to a conversation about the 5 love languages.  She knows what her love languages because she loves to give gifts to people but she is not sure what his is.  I encouraged her to look it up on line and see what his level language is as a way to improve the quality of their time together in their communication.  She does contract for safety having no thoughts of hurting herself or anyone else. Interventions: Cognitive Behavioral Therapy and Dialectical Behavioral Therapy  Diagnosis: Bipolar 2 disorder  Plan: I will meet with the patient every 2  to 3 weeks via video session. Plan of CARE: We will use cognitive behavioral therapy as well as elements of dialectical behavior therapy and person centered therapy to reduce the patient's anxiety and depression by at least 50% by February 01, 2023.  Goals will be for the patient to have less depression especially as it relates to work and indicated by patient report and by PHQ-9 scores.  We also will look at improving mood and returning to a healthier level of functioning which includes identifying causes for depressed mood and learning ways to cope with depression.  Interventions will be  using cognitive behavioral therapy to explore and replace unhealthy thoughts and behavior patterns contributing to depression, teach and encouraged use of coping skills for management of depressive symptoms.  Goals for improving anxiety include improving the patient's ability to manage anxiety symptoms and stress especially at work related, identify any other additional causes for anxiety and explore ways to reduce it as well as beginning to and continuing to resolving core conflicts contributing to his cause of anxiety.  The final goal is to manage thoughts and worrisome thinking contributing to anxiety.  Interventions include providing education about the anxiety to help her understand its causes symptoms and triggers, teach coping skills for managing and reducing anxiety such as guided imagery meditation, grounding exercises etc.  We will also use CBT to identify and change anxiety provoking thoughts and behavior patterns as well as dialectical behavior therapy distress tolerance and mindfulness skills to help manage anxiety symptoms. Progress: 25% [name], [facility]                  [name], [facility]               [name], [facility]               [name], [facility]

## 2022-11-14 ENCOUNTER — Encounter: Payer: Self-pay | Admitting: Behavioral Health

## 2022-11-14 ENCOUNTER — Ambulatory Visit (INDEPENDENT_AMBULATORY_CARE_PROVIDER_SITE_OTHER): Payer: BC Managed Care – PPO | Admitting: Behavioral Health

## 2022-11-14 DIAGNOSIS — F411 Generalized anxiety disorder: Secondary | ICD-10-CM

## 2022-11-14 DIAGNOSIS — F3181 Bipolar II disorder: Secondary | ICD-10-CM

## 2022-11-14 DIAGNOSIS — F331 Major depressive disorder, recurrent, moderate: Secondary | ICD-10-CM

## 2022-11-14 NOTE — Progress Notes (Signed)
[facility] Counselor/Therapist Progress Note  Patient ID: Tara Sullivan, MRN: [id number],    Date: 11/14/22  Time Spent: 53 minutes, 3 PM until 3:53 PM via video session.This session was held via video teletherapy. The patient consented to the video teletherapy and was located in her home during this session. She is aware it is the responsibility of the patient to secure confidentiality on her end of the session. The provider was in a private home office for the duration of this session.      Treatment Type: Individual Therapy  Reported Symptoms: Anxiety, depression  Mental Status Exam: Appearance:  Well Groomed     Behavior: Appropriate  Motor: Normal  Speech/Language:  Negative  Affect: Appropriate  Mood: normal  Thought process: normal  Thought content:   WNL  Sensory/Perceptual disturbances:   WNL  Orientation: oriented to person, place, time/date, situation, day of week, month of year, and year  Attention: Good  Concentration: Good  Memory: WNL  Fund of knowledge:  Good  Insight:   Good  Judgment:  Good  Impulse Control: Good   Risk Assessment: Danger to Self:  No Self-injurious Behavior: No Danger to Others: No Duty to Warn:no Physical Aggression / Violence:No  Access to Firearms a concern: No  Gang Involvement:No   Subjective: The patient's work is slow but her work with her doctor is not.  She still has a lot of messages and phone calls to return.  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  She is returning all of her messages on time.  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  She is not sure what she would like to do otherwise but we talked about some possibilities  thinking outside the box.  Energy for education and finances restricted that so we looked at ways that she might be able to volunteer to do something to break up the routine.  We also looked very closely at her relationship with her boyfriend and where that is, what her expectations are and what his expectations are.  There is a very significant variance there.  Their work schedules are different so that plays into to talk about what could make that relationship healthier.  We talked about what it is that she is looking for in a relationship for and what is okay and what is not okay, what is I must have versus a what is acceptable etc.  He spends a lot of time gaming or watching videos about gaming and hockey season is starting and he is a big fan so we talked about how she might be able to find a way to engage with those things even if she does not necessarily love them with the hopes that he might see her effort and make an effort to engage in things with her such as going out on Saturday nights.  She does contract for safety having no thoughts of hurting herself or anyone else. Interventions: Cognitive Behavioral Therapy and Dialectical Behavioral Therapy  Diagnosis: Bipolar 2 disorder  Plan: I will meet with the patient every 2 to 3 weeks via video session. Plan of CARE: We will use cognitive behavioral therapy as  well as elements of dialectical behavior therapy and person centered therapy to reduce the patient's anxiety and depression by at least 50% by February 01, 2023.  Goals will be for the patient to have less depression especially as it relates to work and indicated by patient report and by PHQ-9 scores.  We also will look at improving mood and returning to a healthier level of functioning which includes identifying causes for depressed mood and learning ways to cope with depression.  Interventions will be using cognitive behavioral therapy to explore and replace unhealthy thoughts and behavior  patterns contributing to depression, teach and encouraged use of coping skills for management of depressive symptoms.  Goals for improving anxiety include improving the patient's ability to manage anxiety symptoms and stress especially at work related, identify any other additional causes for anxiety and explore ways to reduce it as well as beginning to and continuing to resolving core conflicts contributing to his cause of anxiety.  The final goal is to manage thoughts and worrisome thinking contributing to anxiety.  Interventions include providing education about the anxiety to help her understand its causes symptoms and triggers, teach coping skills for managing and reducing anxiety such as guided imagery meditation, grounding exercises etc.  We will also use CBT to identify and change anxiety provoking thoughts and behavior patterns as well as dialectical behavior therapy distress tolerance and mindfulness skills to help manage anxiety symptoms. Progress: 25% [name], [facility]                  [name], [facility]               [name], [facility]               [name], [facility]               [name], [facility]

## 2022-11-27 ENCOUNTER — Ambulatory Visit: Payer: BC Managed Care – PPO | Admitting: Behavioral Health

## 2022-11-27 ENCOUNTER — Encounter: Payer: Self-pay | Admitting: Behavioral Health

## 2022-11-27 DIAGNOSIS — F3181 Bipolar II disorder: Secondary | ICD-10-CM

## 2022-11-27 DIAGNOSIS — F411 Generalized anxiety disorder: Secondary | ICD-10-CM

## 2022-11-27 DIAGNOSIS — F330 Major depressive disorder, recurrent, mild: Secondary | ICD-10-CM

## 2022-11-27 NOTE — Progress Notes (Signed)
[facility] Counselor/Therapist Progress Note  Patient ID: Tara Sullivan, MRN: [id number],    Date: 11/27/22  Time Spent: 4:00 PM until 4:53 PM, 53 minutes  via video session.This session was held via video teletherapy. The patient consented to the video teletherapy and was located in her home during this session. She is aware it is the responsibility of the patient to secure confidentiality on her end of the session. The provider was in a private home office for the duration of this session.      Treatment Type: Individual Therapy  Reported Symptoms: Anxiety, depression  Mental Status Exam: Appearance:  Well Groomed     Behavior: Appropriate  Motor: Normal  Speech/Language:  Negative  Affect: Appropriate  Mood: normal  Thought process: normal  Thought content:   WNL  Sensory/Perceptual disturbances:   WNL  Orientation: oriented to person, place, time/date, situation, day of week, month of year, and year  Attention: Good  Concentration: Good  Memory: WNL  Fund of knowledge:  Good  Insight:   Good  Judgment:  Good  Impulse Control: Good   Risk Assessment: Danger to Self:  No Self-injurious Behavior: No Danger to Others: No Duty to Warn:no Physical Aggression / Violence:No  Access to Firearms a concern: No  Gang Involvement:No   Subjective: I completed a PHQ-9 score with the patient.  She scored a 6 which was down from 12 which is what she scored prior to beginning therapy with her.  Currently most of her sadness/depression is related to a family situation with mom and sister and also situation with a good friend and her husband.  She is available to both but says it makes her sad and her runs through her head.  We talked about being attentive listener and being available but also finding ways to download the information emotionally that she is receiving from them.  We looked at ways that would be beneficial to her.  We talked about journaling but she is  good about texting other friends as a way to not hold onto that.  There still is not a lot of excitement in her work situation and she is not sure what she wants to do otherwise yet.  We did talk more about volunteering at like an animal rescue program which she thinks will bring her some joy and be a good distraction for her.  She is trying to be engaged in a little bit more with her boyfriend's interest especially hockey with hopes that he might reciprocate.  There was some positive signs over the weekend and that she asked him to go on a date night and he did volunteer some suggestions which is unusual.  They did decide to stay home and do some things watching movies but he still is somewhat insensitive in the way he handles things.  She said that he says he is very direct but we talked about ways she could address that with him in a verbally assertive way helping him understand how some of what he says affects her feelings.  She said that one of her friends has had some suicidal thoughts so I made her aware and so that she could share about the 51 phone number that is available in free 24/7.  She does contract for safety having no thoughts of hurting herself or anyone else. Interventions: Cognitive Behavioral Therapy and Dialectical Behavioral Therapy  Diagnosis: Bipolar 2 disorder  Plan: I will meet with the patient every 2 to 3 weeks  via video session. Plan of CARE: We will use cognitive behavioral therapy as well as elements of dialectical behavior therapy and person centered therapy to reduce the patient's anxiety and depression by at least 50% by February 01, 2023.  Goals will be for the patient to have less depression especially as it relates to work and indicated by patient report and by PHQ-9 scores.  We also will look at improving mood and returning to a healthier level of functioning which includes identifying causes for depressed mood and learning ways to cope with depression.   Interventions will be using cognitive behavioral therapy to explore and replace unhealthy thoughts and behavior patterns contributing to depression, teach and encouraged use of coping skills for management of depressive symptoms.  Goals for improving anxiety include improving the patient's ability to manage anxiety symptoms and stress especially at work related, identify any other additional causes for anxiety and explore ways to reduce it as well as beginning to and continuing to resolving core conflicts contributing to his cause of anxiety.  The final goal is to manage thoughts and worrisome thinking contributing to anxiety.  Interventions include providing education about the anxiety to help her understand its causes symptoms and triggers, teach coping skills for managing and reducing anxiety such as guided imagery meditation, grounding exercises etc.  We will also use CBT to identify and change anxiety provoking thoughts and behavior patterns as well as dialectical behavior therapy distress tolerance and mindfulness skills to help manage anxiety symptoms. Progress: 25% [name], [facility]                  [name], [facility]               [name], [facility]               [name], [facility]               [name], [facility]               [name], [facility]

## 2022-12-11 ENCOUNTER — Other Ambulatory Visit (HOSPITAL_COMMUNITY): Payer: Self-pay

## 2022-12-11 MED ORDER — VITAMIN B-12 1000 MCG PO TABS
ORAL_TABLET | ORAL | 1 refills | Status: AC
  Filled 2022-12-11: qty 90, 90d supply, fill #0

## 2022-12-11 MED ORDER — FERROUS GLUCONATE 324 (38 FE) MG PO TABS
324.0000 mg | ORAL_TABLET | Freq: Every day | ORAL | 1 refills | Status: AC
  Filled 2022-12-11: qty 90, 90d supply, fill #0

## 2022-12-11 MED ORDER — VITAMIN D3 1.25 MG (50000 UT) PO CAPS
50000.0000 [IU] | ORAL_CAPSULE | ORAL | 0 refills | Status: AC
  Filled 2022-12-11: qty 24, 84d supply, fill #0

## 2022-12-12 ENCOUNTER — Ambulatory Visit: Payer: BC Managed Care – PPO | Admitting: Behavioral Health

## 2022-12-12 ENCOUNTER — Encounter: Payer: Self-pay | Admitting: Behavioral Health

## 2022-12-12 DIAGNOSIS — F331 Major depressive disorder, recurrent, moderate: Secondary | ICD-10-CM

## 2022-12-12 DIAGNOSIS — F3181 Bipolar II disorder: Secondary | ICD-10-CM

## 2022-12-12 DIAGNOSIS — F411 Generalized anxiety disorder: Secondary | ICD-10-CM

## 2022-12-12 NOTE — Progress Notes (Signed)
[facility] Counselor/Therapist Progress Note  Patient ID: Tara Sullivan, MRN: [id number],    Date: 12/12/22  Time Spent: 4:00 PM until 4:48 PM, 48 minutes  via video session.This session was held via video teletherapy. The patient consented to the video teletherapy and was located in her home during this session. She is aware it is the responsibility of the patient to secure confidentiality on her end of the session. The provider was in a private home office for the duration of this session.      Treatment Type: Individual Therapy  Reported Symptoms: Anxiety, depression  Mental Status Exam: Appearance:  Well Groomed     Behavior: Appropriate  Motor: Normal  Speech/Language:  Negative  Affect: Appropriate  Mood: normal  Thought process: normal  Thought content:   WNL  Sensory/Perceptual disturbances:   WNL  Orientation: oriented to person, place, time/date, situation, day of week, month of year, and year  Attention: Good  Concentration: Good  Memory: WNL  Fund of knowledge:  Good  Insight:   Good  Judgment:  Good  Impulse Control: Good   Risk Assessment: Danger to Self:  No Self-injurious Behavior: No Danger to Others: No Duty to Warn:no Physical Aggression / Violence:No  Access to Firearms a concern: No  Gang Involvement:No   Subjective: The patient says she has felt fairly "blah" this week.  She has had little motivation to get work done although she for the most part is giving and completed so we talked about compartmentalization doing a little bit at a time especially returning phone calls and/or messages to the patient so that it does not look or feel so overwhelming.  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  I continue to encourage her to at least think or look outside the box of something she could do  either minimally at a volunteer level or something that might look like a vocation in working with animals that she might enjoy more.  She went with some friends this past weekend and said that although everyone was okay they probably consumed a little bit too much and that is the first time she had done that a couple years.  She does have a history where she did that regularly but a combination of positive factors led her to choose to stop doing that.  She recognized that she was in an environment she was comfortable with and with people that she was comfortable with that she wants to make sure she can trust herself moving forward.  There are a couple of events upcoming which she would like to go to talked about the possibility of her boyfriend going with her or if she felt the need to make different choices.   She does contract for safety having no thoughts of hurting herself or anyone else. Interventions: Cognitive Behavioral Therapy and Dialectical Behavioral Therapy  Diagnosis: Bipolar 2 disorder  Plan: I will meet with the patient every 2 to 3 weeks via video session. Plan of CARE: We will use cognitive behavioral therapy as well as elements of dialectical behavior therapy and person centered therapy to reduce the patient's anxiety and depression by at least 50% by February 01, 2023.  Goals will be for the patient to have less depression especially as it relates to work and indicated by  patient report and by PHQ-9 scores.  We also will look at improving mood and returning to a healthier level of functioning which includes identifying causes for depressed mood and learning ways to cope with depression.  Interventions will be using cognitive behavioral therapy to explore and replace unhealthy thoughts and behavior patterns contributing to depression, teach and encouraged use of coping skills for management of depressive symptoms.  Goals for improving anxiety include improving the patient's ability to  manage anxiety symptoms and stress especially at work related, identify any other additional causes for anxiety and explore ways to reduce it as well as beginning to and continuing to resolving core conflicts contributing to his cause of anxiety.  The final goal is to manage thoughts and worrisome thinking contributing to anxiety.  Interventions include providing education about the anxiety to help her understand its causes symptoms and triggers, teach coping skills for managing and reducing anxiety such as guided imagery meditation, grounding exercises etc.  We will also use CBT to identify and change anxiety provoking thoughts and behavior patterns as well as dialectical behavior therapy distress tolerance and mindfulness skills to help manage anxiety symptoms. Progress: 25% [name], [facility]                  [name], [facility]               [name], [facility]               [name], [facility]               [name], [facility]               [name], [facility]               [name], [facility]

## 2022-12-20 ENCOUNTER — Other Ambulatory Visit (HOSPITAL_COMMUNITY): Payer: Self-pay

## 2022-12-23 ENCOUNTER — Other Ambulatory Visit (HOSPITAL_COMMUNITY): Payer: Self-pay

## 2022-12-25 ENCOUNTER — Ambulatory Visit: Payer: BC Managed Care – PPO | Admitting: Behavioral Health

## 2023-01-09 ENCOUNTER — Ambulatory Visit: Payer: BC Managed Care – PPO | Admitting: Behavioral Health

## 2023-02-07 ENCOUNTER — Ambulatory Visit (INDEPENDENT_AMBULATORY_CARE_PROVIDER_SITE_OTHER): Payer: 59 | Admitting: Physician Assistant

## 2023-04-04 ENCOUNTER — Telehealth: Payer: Self-pay | Admitting: Physician Assistant

## 2023-04-04 ENCOUNTER — Other Ambulatory Visit: Payer: Self-pay

## 2023-04-04 ENCOUNTER — Other Ambulatory Visit (HOSPITAL_COMMUNITY): Payer: Self-pay

## 2023-04-04 MED ORDER — ALPRAZOLAM 0.5 MG PO TABS
0.5000 mg | ORAL_TABLET | Freq: Every evening | ORAL | 0 refills | Status: DC | PRN
Start: 2023-04-04 — End: 2023-04-24
  Filled 2023-04-04 (×2): qty 30, 30d supply, fill #0

## 2023-04-04 MED ORDER — BUPROPION HCL ER (SR) 150 MG PO TB12
150.0000 mg | ORAL_TABLET | Freq: Two times a day (BID) | ORAL | 0 refills | Status: DC
  Filled 2023-04-04 (×2): qty 30, 15d supply, fill #0

## 2023-04-04 NOTE — Telephone Encounter (Signed)
Pt lvm that she has stopped medication for months now. She has been having severe panic attacks  at night. She has an appt 04/24/23. However she would like to see if teresa will prescribe something for  to help with at. Please call her at [phone]

## 2023-04-04 NOTE — Telephone Encounter (Signed)
Tara Sullivan reports that she stopped taking her meds in August. She has a lot of stuff going on, she has been waking up in complete panic. She can't sleep at night. She says that her bf was in the hospital dt a collapsed  lung, bills piling up and they've been fighting.  She is not taking any medication currently.  She reports that she goes to sleep at 10 pm and wakes up at 6:20 am, the past couple weeks it's been interrupted. She said that she feels like she's sleeping for hours but in reality, its only been 30 minutes.  She says that she drinks a soda at dinner around 9 pm. I advised her to not drink soda that late and that caffeine should be limited before noon and about being on electronics 2 hours before bed.   She was taking Wellbutrin 300 mg , Rexulti 4mg  and Adderall 30 mg. She wants to go back on those medications and something to help aid with anxiety too. I pended previous medications and xanax 0.5 mg if appropriate.   Please advise.

## 2023-04-04 NOTE — Telephone Encounter (Signed)
I sent in the Xanax but not the Rexulti,, Adderall, or Wellbutrin.  Since she has been off those for so long she cannot restart at those doses.  Please pend or send in  Wellbutrin XL 150 mg, #30, 1 p.o. daily with no refills.  Thank you

## 2023-04-04 NOTE — Telephone Encounter (Signed)
Pt notified and rx sent  °

## 2023-04-07 ENCOUNTER — Other Ambulatory Visit: Payer: Self-pay

## 2023-04-24 ENCOUNTER — Encounter: Payer: Self-pay | Admitting: Physician Assistant

## 2023-04-24 ENCOUNTER — Ambulatory Visit (INDEPENDENT_AMBULATORY_CARE_PROVIDER_SITE_OTHER): Payer: 59 | Admitting: Physician Assistant

## 2023-04-24 DIAGNOSIS — F411 Generalized anxiety disorder: Secondary | ICD-10-CM

## 2023-04-24 DIAGNOSIS — Z566 Other physical and mental strain related to work: Secondary | ICD-10-CM | POA: Diagnosis not present

## 2023-04-24 DIAGNOSIS — F331 Major depressive disorder, recurrent, moderate: Secondary | ICD-10-CM | POA: Diagnosis not present

## 2023-04-24 MED ORDER — ALPRAZOLAM 1 MG PO TABS
0.5000 mg | ORAL_TABLET | Freq: Two times a day (BID) | ORAL | 0 refills | Status: DC | PRN
Start: 2023-04-24 — End: 2023-07-04

## 2023-04-24 MED ORDER — FLUVOXAMINE MALEATE 100 MG PO TABS: ORAL_TABLET | ORAL | 1 refills | Status: DC

## 2023-04-24 MED ORDER — BUPROPION HCL ER (SR) 150 MG PO TB12: 150.0000 mg | ORAL_TABLET | Freq: Two times a day (BID) | ORAL | 1 refills | Status: DC

## 2023-04-24 NOTE — Progress Notes (Signed)
 [facility] Med Check  Patient ID: Tara Sullivan,  MRN: [id number]  PCP: Larkin Ina  Date of Evaluation: 04/24/2023 Time spent:25 minutes  Chief Complaint:  Chief Complaint   Depression; Anxiety    HISTORY/CURRENT STATUS: HPI For routine med check.     She went off all her meds last summer. Hard time financially and felt like she was doing better and didn't need her meds.   For the past few months, has been extremely anxious, not sleeping well, having nightmares 3-4 nights per week. Doesn't feel rested when she gets up.  Talks in her sleep. Boyfriend had a collapsed lung and was in the hosp but is better now so that's been stressful.  Having PA probably 3 days per week. She's taking the Xanax for sleep only, it helps some but not as much as needed.   Patient is able to enjoy things.  Energy and motivation are good.  Work is stressful, she's a CMA.  Not calling out sick.  No extreme sadness, tearfulness, or feelings of hopelessness.  ADLs and personal hygiene are normal.   Denies any changes in concentration, making decisions, or remembering things.  Appetite has not changed.  Weight is stable.  Denies suicidal or homicidal thoughts.  Has been spending more money for the past year.  Patient denies increased energy with decreased need for sleep, increased talkativeness, racing thoughts, impulsivity or risky behaviors, increased libido, grandiosity, increased irritability or anger, paranoia, or hallucinations.  Denies dizziness, syncope, seizures, numbness, tingling, tremor, tics, unsteady gait, slurred speech, confusion. Denies muscle or joint pain, stiffness, or dystonia.Denies unexplained weight loss, frequent infections, or sores that heal slowly.  No polyphagia, polydipsia, or polyuria. Denies visual changes or paresthesias.   Individual Medical History/ Review of Systems: Changes? :No    Past medications for mental health diagnoses include: Zoloft, Lexapro, Seroquel,  lithium, Latuda caused stomach pain, Wellbutrin, BuSpar ineffective, Pristiq, Klonopin, Prazosin x 1 night, then no longer needed, Strattera, Trileptal caused nausea, Lamictal didn't help.  Allergies: Latex and Trileptal [oxcarbazepine]  Current Medications:  Current Outpatient Medications:    ALPRAZolam (XANAX) 1 MG tablet, Take 0.5-1 tablets (0.5-1 mg total) by mouth 2 (two) times daily as needed for anxiety., Disp: 60 tablet, Rfl: 0   fluvoxaMINE (LUVOX) 100 MG tablet, 1/2 po at bedtime for 2 weeks, then increase to 1 po at bedtime., Disp: 30 tablet, Rfl: 1   buPROPion (WELLBUTRIN SR) 150 MG 12 hr tablet, Take 1 tablet (150 mg total) by mouth 2 (two) times daily., Disp: 60 tablet, Rfl: 1   Cholecalciferol (VITAMIN D3) 1.25 MG (50000 UT) CAPS, Take 1 capsule (50,000 Units total) by mouth 2 (two) times a week. (Patient not taking: Reported on 04/24/2023), Disp: 24 capsule, Rfl: 0   cyanocobalamin (VITAMIN B12) 1000 MCG tablet, Take one tablet (1,000 mcg dose) by mouth daily. (Patient not taking: Reported on 04/24/2023), Disp: 90 tablet, Rfl: 1   etonogestrel-ethinyl estradiol (NUVARING) 0.12-0.015 MG/24HR vaginal ring, Insert 1 ring vaginally every 4 weeks, skipping placebo week (use continuously), Disp: 3 each, Rfl: 4   ferrous gluconate (FERGON) 324 MG tablet, Take 1 tablet (324 mg total) by mouth daily with breakfast., Disp: 90 tablet, Rfl: 1 Medication Side Effects:  Decreased libido but not sure if only med related.     Family Medical/ Social History: Changes?  no  MENTAL HEALTH EXAM:  There were no vitals taken for this visit.There is no height or weight on file to calculate BMI.  General Appearance: Casual, Neat, Well Groomed and Obese  Eye Contact:  Good  Speech:  Clear and Coherent and Normal Rate  Volume:  Normal  Mood:  Euthymic  Affect:  Appropriate  Thought Process:  Goal Directed and Descriptions of Associations: Intact  Orientation:  Full (Time, Place, and Person)  Thought  Content: Logical   Suicidal Thoughts:  No  Homicidal Thoughts:  No  Memory:  WNL  Judgement:  Good  Insight:  Good  Psychomotor Activity:  Normal  Concentration:  Concentration: Good and Attention Span: Good  Recall:  Good  Fund of Knowledge: Good  Language: Good  Assets:  Communication Skills Desire for Improvement Financial Resources/Insurance Housing Resilience Transportation Vocational/Educational  ADL's:  Intact  Cognition: WNL  Prognosis:  Good   DIAGNOSES:    ICD-10-CM   1. Generalized anxiety disorder  F41.1     2. Major depressive disorder, recurrent episode, moderate (HCC)  F33.1     3. Work stress  Z56.6       Receiving Psychotherapy: No  has seen Kerri Perches in the past.   RECOMMENDATIONS:  PDMP was reviewed.  Xanax on 04/04/2023.  Last Adderall filled 06/17/2022. I provided  25  minutes of face to face time during this encounter, including time spent before and after the visit in records review, medical decision making, counseling pertinent to today's visit, and charting.   Discussed med compliance. She prefers not to take an AP or mood stabilizer, feels like she needs something to help w/ anxiety and anhedonia. We will watch for manic sx and may need to restart an AP. She understands.   Continue Xanax 1 mg, 1/2-1 po bid prn. Restart Wellburin SR 150 mg, 1 bid.  Start Luvox 100 mg, 1/2 at bedtime for 2 weeks, then increase to 1 po at bedtime.  Get back in counseling. Return in 6-8 weeks.    Melony Overly, PA-C

## 2023-05-09 ENCOUNTER — Ambulatory Visit: Payer: 59 | Admitting: Physician Assistant

## 2023-06-03 ENCOUNTER — Telehealth: Payer: Self-pay | Admitting: Physician Assistant

## 2023-06-03 NOTE — Telephone Encounter (Signed)
 Pt called at 9:30a stating that Rosey Bath has her taking 1 Xanax at night.  She said she has been waking up at 3:30 in the morning paranoid and unable to go back to sleep.  She said she's been taking medication for about 1.5 months.  She wants to know what else she can do.  Next appt 5/2

## 2023-06-04 NOTE — Telephone Encounter (Signed)
 Patient reporting that Xanax is helping her get to sleep, but is not keeping her sleep. She wakes up around 3:00 a.m. and has paranoia. Sometimes she is able to get back to sleep, but not always. Estimates 5-6 hours of sleep. Reviewed sleep hygiene. She has a soda at dinner and has screen time on her phone before bed. Told her no caffeine after 2:00 and no screen time at least an hour before bed. Asked if there were any new stressors and she said they are moving.    [facility]armacy is [facility] in [place]

## 2023-06-04 NOTE — Telephone Encounter (Signed)
 Is she taking the Luvox and Wellbutrin as directed? If so, make sure the 2nd dose of Wellbutrin isn't right before bed. I'd suggest no later than 6 PM. If she is taking the Luvox 100 mg, increase to 150 mg. If not, go from 50 mg to 100 mg.  If not taking it at all, start it like we had discussed.

## 2023-06-04 NOTE — Telephone Encounter (Signed)
 Pt returned Bridget's call at 4:41p

## 2023-06-05 NOTE — Telephone Encounter (Signed)
 Pt reports taking medications as prescribed and not taking Wellbutrin right before bed. Will increase Luvox to 150 mg.

## 2023-06-24 ENCOUNTER — Telehealth: Payer: Self-pay | Admitting: Physician Assistant

## 2023-06-24 MED ORDER — FLUVOXAMINE MALEATE 100 MG PO TABS: ORAL_TABLET | ORAL | 0 refills | Status: DC

## 2023-06-24 NOTE — Telephone Encounter (Signed)
 Sent RF for Luvox  100 mg, 30-day supply, to [facility] in [place].

## 2023-06-24 NOTE — Telephone Encounter (Signed)
 Pt lvm that she needs a refill on her luvox  100 mg. Pharmacy is [facility] in [place]

## 2023-07-04 ENCOUNTER — Encounter: Payer: Self-pay | Admitting: Physician Assistant

## 2023-07-04 ENCOUNTER — Ambulatory Visit: Payer: BC Managed Care – PPO | Admitting: Physician Assistant

## 2023-07-04 DIAGNOSIS — Z566 Other physical and mental strain related to work: Secondary | ICD-10-CM | POA: Diagnosis not present

## 2023-07-04 DIAGNOSIS — F331 Major depressive disorder, recurrent, moderate: Secondary | ICD-10-CM

## 2023-07-04 DIAGNOSIS — F411 Generalized anxiety disorder: Secondary | ICD-10-CM | POA: Diagnosis not present

## 2023-07-04 DIAGNOSIS — F988 Other specified behavioral and emotional disorders with onset usually occurring in childhood and adolescence: Secondary | ICD-10-CM

## 2023-07-04 MED ORDER — ALPRAZOLAM 1 MG PO TABS
0.5000 mg | ORAL_TABLET | Freq: Two times a day (BID) | ORAL | 1 refills | Status: DC | PRN
Start: 2023-07-04 — End: 2023-10-27

## 2023-07-04 NOTE — Progress Notes (Unsigned)
 [facility]  Patient ID: Tara Sullivan,  MRN: [id number]  PCP: Glen Land, PA-C  Date of Evaluation: 07/04/2023 Time spent:20 minutes  Chief Complaint:  Chief Complaint   Depression; Anxiety; Follow-up    HISTORY/CURRENT STATUS: HPI For routine med check.     More anxious and depressed lately.  Occas missing work.  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 We increased Luvox  since LOV, hasn't been on the higher dose long enough to know how much its helping. Gets panicky, feels overwhelmed a lot.  Xanax  at night is helpful but doesn't take during the day b/c concern of drowsiness.   Patient is able to enjoy things.  Energy and motivation are good.   No extreme sadness, tearfulness, or feelings of hopelessness.  Sleeps well most of the time. ADLs and personal hygiene are normal.   Denies any changes in concentration, making decisions, or remembering things.  Appetite has not changed.  Weight is stable.   Denies suicidal or homicidal thoughts.  Patient denies increased energy with decreased need for sleep, increased talkativeness, racing thoughts, impulsivity or risky behaviors, increased spending, increased libido, grandiosity, increased irritability or anger, paranoia, or hallucinations.  Denies dizziness, syncope, seizures, numbness, tingling, tremor, tics, unsteady gait, slurred speech, confusion. Denies muscle or joint pain, stiffness, or dystonia.Denies unexplained weight loss, frequent infections, or sores that heal slowly.  No polyphagia, polydipsia, or polyuria. Denies visual changes or paresthesias.   Individual Medical History/ Review of Systems: Changes? :No    Past medications for mental health diagnoses include: Zoloft , Lexapro, Seroquel , lithium , Latuda  caused stomach pain, Wellbutrin , BuSpar  ineffective, Pristiq , Klonopin , Prazosin  x 1 night, then no longer needed, Strattera , Trileptal  caused nausea, Lamictal  didn't help.  Allergies: Latex and Trileptal   [oxcarbazepine ]  Current Medications:  Current Outpatient Medications:    buPROPion  (WELLBUTRIN  SR) 150 MG 12 hr tablet, Take 1 tablet (150 mg total) by mouth 2 (two) times daily., Disp: 60 tablet, Rfl: 1   ferrous gluconate  (FERGON) 324 MG tablet, Take 1 tablet (324 mg total) by mouth daily with breakfast., Disp: 90 tablet, Rfl: 1   fluvoxaMINE  (LUVOX ) 100 MG tablet, Take 1 po at bedtime. (Patient taking differently: Take 150 mg by mouth at bedtime.), Disp: 30 tablet, Rfl: 0   ALPRAZolam  (XANAX ) 1 MG tablet, Take 0.5-1 tablets (0.5-1 mg total) by mouth 2 (two) times daily as needed for anxiety., Disp: 60 tablet, Rfl: 1   Cholecalciferol  (VITAMIN D3) 1.25 MG (50000 UT) CAPS, Take 1 capsule (50,000 Units total) by mouth 2 (two) times a week. (Patient not taking: Reported on 07/04/2023), Disp: 24 capsule, Rfl: 0   cyanocobalamin  (VITAMIN B12) 1000 MCG tablet, Take one tablet (1,000 mcg dose) by mouth daily. (Patient not taking: Reported on 07/04/2023), Disp: 90 tablet, Rfl: 1   etonogestrel -ethinyl estradiol  (NUVARING) 0.12-0.015 MG/24HR vaginal ring, Insert 1 ring vaginally every 4 weeks, skipping placebo week (use continuously), Disp: 3 each, Rfl: 4 Medication Side Effects:  Decreased libido but not sure if only med related.     Family Medical/ Social History: Changes?  no  MENTAL HEALTH EXAM:  There were no vitals taken for this visit.There is no height or weight on file to calculate BMI.  General Appearance: Casual, Neat, Well Groomed and Obese  Eye Contact:  Good  Speech:  Clear and Coherent and Normal Rate  Volume:  Normal  Mood:   sad  Affect:  Appropriate  Thought Process:  Goal Directed and Descriptions of Associations: Intact  Orientation:  Full (Time, Place, and Person)  Thought Content: Logical   Suicidal Thoughts:  No  Homicidal Thoughts:  No  Memory:  WNL  Judgement:  Good  Insight:  Good  Psychomotor Activity:  Normal  Concentration:  Concentration: Good and Attention Span:  Good  Recall:  Good  Fund of Knowledge: Good  Language: Good  Assets:  Communication Skills Desire for Improvement Financial Resources/Insurance Housing Resilience Transportation Vocational/Educational  ADL's:  Intact  Cognition: WNL  Prognosis:  Good   DIAGNOSES:    ICD-10-CM   1. Generalized anxiety disorder  F41.1     2. Major depressive disorder, recurrent episode, moderate (HCC)  F33.1     3. Work stress  Z56.6     4. 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   RECOMMENDATIONS:  PDMP was reviewed.  Xanax  on 04/24/2023.  Adderall filled 06/17/2022. I provided 20  minutes of face to face time during this encounter, including time spent before and after the visit in records review, medical decision making, counseling pertinent to today's visit, and charting.   We discussed her sx.  She can try cutting the xanax  in half, see if that's effective during the day without causing drowsiness.  If it's too difficult to cut by 1/4, Ok to send xanax  0.25 mg for daytime use.  She hasn't been on the current dose of Luvox  long enough to judge efficacy yet.  No change in dose.   Continue Xanax  1 mg, 1/2-1 po bid prn. Continue Wellburin SR 150 mg, 1 bid.  Continue Luvox  100 mg, 1.5 pills nightly.  Return in 6-8 weeks.    Marvia Slocumb, PA-C

## 2023-08-03 ENCOUNTER — Other Ambulatory Visit: Payer: Self-pay | Admitting: Physician Assistant

## 2023-09-02 ENCOUNTER — Other Ambulatory Visit: Payer: Self-pay | Admitting: Physician Assistant

## 2023-09-12 ENCOUNTER — Ambulatory Visit: Admitting: Physician Assistant

## 2023-09-12 ENCOUNTER — Encounter: Payer: Self-pay | Admitting: Physician Assistant

## 2023-09-12 DIAGNOSIS — F988 Other specified behavioral and emotional disorders with onset usually occurring in childhood and adolescence: Secondary | ICD-10-CM | POA: Diagnosis not present

## 2023-09-12 DIAGNOSIS — F411 Generalized anxiety disorder: Secondary | ICD-10-CM

## 2023-09-12 DIAGNOSIS — F422 Mixed obsessional thoughts and acts: Secondary | ICD-10-CM

## 2023-09-12 DIAGNOSIS — F330 Major depressive disorder, recurrent, mild: Secondary | ICD-10-CM | POA: Diagnosis not present

## 2023-09-12 MED ORDER — AMPHETAMINE-DEXTROAMPHETAMINE 15 MG PO TABS: 7.5000 mg | ORAL_TABLET | Freq: Two times a day (BID) | ORAL | 0 refills | Status: DC

## 2023-09-12 NOTE — Progress Notes (Signed)
 [facility]  Patient ID: Tara Sullivan,  MRN: [id number]  PCP: Allen Lauraine CROME, PA-C  Date of Evaluation: 09/12/2023 Time spent:20 minutes  Chief Complaint:  Chief Complaint   Anxiety; Depression; Follow-up    HISTORY/CURRENT STATUS: HPI For routine med check.     Having obsessions about BF, stares at phone waiting to see if he responds to her texts, moody, anxious, weird dreams if not taking Xanax  and can't turn her mind off if she doesn't take it.  Work is stressful, not missing days though.  Denies tendency to avoid of things that may trigger anxiety. No tachycardia, palpitations, SOB, sweating, or trembling.   Chose to stop Luvox , caused drowsiness.  She is able to enjoy things.  Energy and motivation are fair to good depending on the day.   No extreme sadness, tearfulness, or feelings of hopelessness.  ADLs and personal hygiene are normal.    Appetite has not changed.  Weight is stable.   No mania, delirium, AH/VH.  No SI/HI.  Hard time focusing and staying on task. Mind wanders a lot. Can't finish things in a timely manner. H/O ADD. Feels like these sx are a part of the cause of anxiety.  Denies dizziness, syncope, seizures, numbness, tingling, tremor, tics, unsteady gait, slurred speech, confusion. Denies muscle or joint pain, stiffness, or dystonia. Denies unexplained weight loss, frequent infections, or sores that heal slowly.  No polyphagia, polydipsia, or polyuria. Denies visual changes or paresthesias.   Individual Medical History/ Review of Systems: Changes? :No    Past medications for mental health diagnoses include: Zoloft , Lexapro, Seroquel , lithium , Latuda  caused stomach pain, Wellbutrin , BuSpar  ineffective, Pristiq , Klonopin , Prazosin  x 1 night, then no longer needed, Strattera , Trileptal  caused nausea, Lamictal  didn't help. Luvox , questional efficacy  Allergies: Latex and Trileptal  [oxcarbazepine ]  Current Medications:  Current Outpatient  Medications:    ALPRAZolam  (XANAX ) 1 MG tablet, Take 0.5-1 tablets (0.5-1 mg total) by mouth 2 (two) times daily as needed for anxiety., Disp: 60 tablet, Rfl: 1   amphetamine -dextroamphetamine  (ADDERALL) 15 MG tablet, Take 0.5-1 tablets by mouth 2 (two) times daily., Disp: 60 tablet, Rfl: 0   buPROPion  (WELLBUTRIN  SR) 150 MG 12 hr tablet, Take 1 tablet by mouth twice daily, Disp: 60 tablet, Rfl: 0   Cholecalciferol  (VITAMIN D3) 1.25 MG (50000 UT) CAPS, Take 1 capsule (50,000 Units total) by mouth 2 (two) times a week., Disp: 24 capsule, Rfl: 0   cyanocobalamin  (VITAMIN B12) 1000 MCG tablet, Take one tablet (1,000 mcg dose) by mouth daily., Disp: 90 tablet, Rfl: 1   etonogestrel -ethinyl estradiol  (NUVARING) 0.12-0.015 MG/24HR vaginal ring, Insert 1 ring vaginally every 4 weeks, skipping placebo week (use continuously), Disp: 3 each, Rfl: 4   ferrous gluconate  (FERGON) 324 MG tablet, Take 1 tablet (324 mg total) by mouth daily with breakfast. (Patient not taking: Reported on 09/12/2023), Disp: 90 tablet, Rfl: 1 Medication Side Effects: Decreased libido but not sure if only med related.    Family Medical/ Social History: Changes?  no  MENTAL HEALTH EXAM:  There were no vitals taken for this visit.There is no height or weight on file to calculate BMI.  General Appearance: Casual, Neat, Well Groomed and Obese  Eye Contact:  Good  Speech:  Clear and Coherent and Normal Rate  Volume:  Normal  Mood:  Anxious  Affect:  Congruent  Thought Process:  Goal Directed and Descriptions of Associations: Intact  Orientation:  Full (Time, Place, and Person)  Thought Content: Logical  Suicidal Thoughts:  No  Homicidal Thoughts:  No  Memory:  WNL  Judgement:  Good  Insight:  Good  Psychomotor Activity:  Normal  Concentration:  Concentration: Fair and Attention Span: Fair  Recall:  Good  Fund of Knowledge: Good  Language: Good  Assets:  Communication Skills Desire for Improvement Financial  Resources/Insurance Housing Resilience Transportation Vocational/Educational  ADL's:  Intact  Cognition: WNL  Prognosis:  Good   DIAGNOSES:    ICD-10-CM   1. Generalized anxiety disorder  F41.1     2. 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 Mild episode of recurrent major depressive disorder (HCC)  F33.0     4. Mixed obsessional thoughts and acts  F42.2      Receiving Psychotherapy: No  has seen Tobias Goldmann in the past.   RECOMMENDATIONS:  PDMP was reviewed.  Xanax  filled 08/03/2023.  Adderall filled 06/17/2022. I provided approximately 20  minutes of face to face time during this encounter, including time spent before and after the visit in records review, medical decision making, counseling pertinent to today's visit, and charting.   Disc dx and treatment options. Will tx ADD. Recommend retrying a stimulant.  Discussed potential benefits, risks, and side effects of stimulants with patient to include increased heart rate, palpitations, insomnia, increased anxiety, increased irritability, or decreased appetite.  The patient understands and accepts these risks.  Instructed patient to contact office if experiencing any significant tolerability issues. She's aware not to take Xanax  except at night, upper/downer effects of BZ and stim were discussed.   Continue Xanax  1 mg, 1/2-1 po bid prn. (Takes only at night) Start Adderall 15 mg, 1/2-1 po bid.  Continue Wellburin SR 150 mg, 1 bid.  Recommend therapy. Return in 6-8 weeks.    Verneita Cooks, PA-C

## 2023-09-21 ENCOUNTER — Encounter: Payer: Self-pay | Admitting: Physician Assistant

## 2023-10-02 ENCOUNTER — Other Ambulatory Visit: Payer: Self-pay | Admitting: Physician Assistant

## 2023-10-24 ENCOUNTER — Ambulatory Visit: Admitting: Physician Assistant

## 2023-10-26 ENCOUNTER — Other Ambulatory Visit: Payer: Self-pay | Admitting: Physician Assistant

## 2023-10-26 DIAGNOSIS — F411 Generalized anxiety disorder: Secondary | ICD-10-CM

## 2023-10-28 ENCOUNTER — Other Ambulatory Visit: Payer: Self-pay | Admitting: Physician Assistant

## 2023-11-21 ENCOUNTER — Other Ambulatory Visit: Payer: Self-pay

## 2023-11-21 ENCOUNTER — Telehealth: Payer: Self-pay | Admitting: Physician Assistant

## 2023-11-21 DIAGNOSIS — F988 Other specified behavioral and emotional disorders with onset usually occurring in childhood and adolescence: Secondary | ICD-10-CM

## 2023-11-21 MED ORDER — AMPHETAMINE-DEXTROAMPHETAMINE 15 MG PO TABS: 7.5000 mg | ORAL_TABLET | Freq: Two times a day (BID) | ORAL | 0 refills | Status: DC

## 2023-11-21 NOTE — Telephone Encounter (Signed)
 Pt calleda t 1:44p requesting refill of Adderall to  [facility] [address] [place], [place] - [address] [facility] [address] [place] [address] Phone: [phone]  Fax: [phone]   Next appt 10/31

## 2023-11-21 NOTE — Telephone Encounter (Signed)
 Pended

## 2023-12-03 NOTE — Progress Notes (Signed)
 Subjective   Patient ID:  Tara Sullivan is a 33 y.o. (DOB Mar 22, 1990) female    Patient presents with  . office visit    Discuss new symptoms, headaches, tongue, sore and hurts    Hello, I am still having these awful headaches that last for days and I've been taking Tylenol  and using an ice wrap for it. I am also having my fingers getting like cold as ice which I am usually a hot married person and my throat is also still sore off and on mostly on the left side which is also giving me a left ear pain as well. I know these aren't typically geographic tongue side effects so I wasn't sure if you would suggest something for these issues or what and what would be causing these. Thank you History of Present Illness The patient presents for evaluation of headaches.  Headaches - Experiencing intermittent headaches since 06/2023, recently more severe - Described as throbbing and shooting, located frontally, with photophobia and phonophobia - Associated left-sided throat pain discussed with ENT - Headaches persist post-sleep and occur during the day - Suspects accidental gluten intake as a trigger despite attempting a gluten-free diet - Ice pack provides temporary relief - Tylenol  2-3 times daily for two weeks offers temporary relief; pain returns post-medication - Previously took Excedrin every 2-3 days for 3-4 months  Throat/Tongue Pain - Reports stabbing throat/tongue pain radiating to ears, resolving after two days - Sensation of neck/tongue swelling and burning mouth/tongue, though improved  Additional Information - No history of migraines or consistent headaches - No seizure medication - No implants or devices - Under psychiatric care long-term - Upcoming nutritionist appointment in 01/2024 - No fever, highest temperature recorded 99.21F  Review of Systems per HPI  Objective  BP 122/86 (BP Location: Left Upper Arm, Patient Position: Sitting)   Pulse 103   Temp 97.8 F  (36.6 C) (Temporal)   Ht 5' 7 (1.702 m)   Wt 232 lb (105.2 kg)   SpO2 99%   Breastfeeding No   BMI 36.34 kg/m    Physical Exam Constitutional:      General: She is not in acute distress.    Appearance: Normal appearance. She is not ill-appearing or toxic-appearing.  HENT:     Head: Normocephalic and atraumatic.  Pulmonary:     Effort: Pulmonary effort is normal.  Neurological:     General: No focal deficit present.     Mental Status: She is alert and oriented to person, place, and time.     Cranial Nerves: No cranial nerve deficit.     Motor: No weakness.     Gait: Gait normal.  Psychiatric:        Mood and Affect: Mood normal.        Behavior: Behavior normal.        Thought Content: Thought content normal.        Judgment: Judgment normal.      Assessment and Plan  1. Headache, chronic daily (Primary) -     MRI Head WO Contrast -     amitriptyline HCl (ELAVIL) 25 mg tablet -     Ambulatory referral to Neurology 2. Geographic tongue 3. Glossitis    Assessment & Plan 1.  - Persistent for 6 months, worsening with frequent Excedrin and Tylenol  use - Normal neurological exam, Tylenol  provides temporary relief - Order nonemergent MRI head - Discussed amitriptyline side effects: constipation, blurred vision, dry mouth, urinary retention, bleeding risk with  antiplatelets, QT prolongation, arrhythmias, dizziness, drowsiness, confusion, fatigue, hyponatremia, liver injury - Gradual Tylenol  reduction to 1-2 times daily over 2 weeks - Prescribe amitriptyline 25 mg, start today - Refer to neurology - follow up in 1 month if no appt with neurology scheduled.   2, 3 - Improved but persistent burning mouth/tongue, throat pain, and swollen tongue associated with headaches - Follow up with geographic tongue specialist and nutritionist in November - Maintain normal B12 levels  Follow-up: - Scheduled for 12/31/2023  Risks, benefits, and alternatives of the medications and  treatment plan prescribed today were discussed, and patient expressed understanding. Plan follow-up as discussed or as needed if any worsening symptoms or change in condition.    Computer technology was used to create visit note. Consent from the patient/caregiver was obtained prior to its use.  Documentation for time-based billing:  Total time spent of date of service was 30 minutes.  Patient care activities included preparing to see the patient such as reviewing the patient record, obtaining and/or reviewing separately obtained history, performing a medically appropriate history and physical examination, counseling and educating the patient, family, and/or caregiver, ordering prescription medications, tests, or procedures and documenting clinical information in the electronic or other health record.

## 2023-12-12 NOTE — Progress Notes (Signed)
 Assessment & Plan:   Assessment & Plan Throat pain: Burning mouth syndrome: The patient reports a sensation of throat closing and intermittent throat pain since 06/2023. Physical examination reveals no evidence of infection or abscess. The condition may be neuropathic in nature.   A referral to specialist for geographic tongue is pending. The patient is advised to contact [facility] by Tuesday if she does not hear about the referral. Gabapentin 100-300 mg, to be taken 1-3 times daily, has been prescribed to manage the pain.  If she experiences difficulty breathing, she should seek immediate medical attention at the emergency room.  Follow-up: The patient will follow up with [facility] on 12/31/2023.   Orders and visit diagnoses are noted in the table below: 1. Burning mouth syndrome  gabapentin (NEURONTIN) 100 mg capsule      Publishing Rights Manager was used to create visit note. Consent from the patient/caregiver was obtained prior to its use. This note was in whole or partially written using DAX AI software.  It has been reviewed by the provider.  Medications: Current Outpatient Medications  Medication Instructions  . amitriptyline HCl (ELAVIL) 25 mg, Oral, At bedtime  . amphetamine -dextroamphetamine  (ADDERALL) 15 MG tablet 15 mg, 3 times a day  . cyanocobalamin  (VITAMIN B-12) 1,000 mcg, Intramuscular, Weekly at 0900  . etonogestrel -ethinyl estradiol  (NUVARING) 0.12-0.015 MG/24HR vaginal ring Insert vaginally and leave in place for 3 consecutive weeks, then remove for 1 week.  . ferrous sulfate 325 mg, Oral, Every other day  . gabapentin (NEURONTIN) 100 mg capsule Take 100 to 300 mg 1 to 3 times daily.  MAX daily dose: 900 mg.  . vitamin D3, cholecalciferol , (OPTIMAL-D) 50,000 units CAPS 1 capsule, Oral, Weekly at 0900     Risks, benefits, and alternatives of the medications and treatment plan prescribed today were discussed, and patient expressed understanding. Plan follow-up  as discussed or as needed if any worsening symptoms or change in condition.      Subjective   Patient ID:  Tara Sullivan is a 33 y.o. (DOB 08-06-1990) female.     Patient presents with  . Sore Throat    Throat and tongue pain scale of 8. This has been going on since April, when she was swabbed for strep which was negative. She has seen [facility] and dentists and is trying to get this figured out.     Relevant previous notes: --  HPI History of Present Illness The patient presents for evaluation of pharyngalgia.  She has been experiencing intermittent pharyngalgia since April 2025, which she describes as a sensation of throat constriction. She reports no dyspnea. She has not yet initiated the amitriptyline prescribed for her cephalalgia. She has sought consultation from both a dentist and an otolaryngologist. She was referred to neurology for her cephalalgia. A gluten-free diet has been beneficial in managing her cephalalgia.  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  Results Diagnostic Testing  - Endoscopy: Totally normal. Biopsies were normal.   Reviewed and updated this visit by provider: Tobacco  Allergies  Meds  Problems  Med Hx  Surg Hx  Fam Hx         Review of Systems  Per HPI  Objective   Vitals:   12/12/23 1438  BP: 126/84  Patient Position: Sitting  Pulse: 103  Temp: 98 F (36.7 C)  TempSrc: Temporal  Resp: 16  Height: 5' 7 (1.702 m)  Weight: 229  lb 3.2 oz (104 kg)  SpO2: 96%  BMI (Calculated): 35.9  PainSc:   8  PainLoc: Comment: Throat and tongue   Physical Exam Vitals and nursing note reviewed.  Constitutional:      General: She is not in acute distress.    Appearance: Normal appearance. She is not ill-appearing.  HENT:     Mouth/Throat:     Mouth: Mucous membranes are moist.     Pharynx: Oropharynx is clear. No oropharyngeal exudate or  posterior oropharyngeal erythema.  Cardiovascular:     Rate and Rhythm: Normal rate.  Pulmonary:     Effort: Pulmonary effort is normal.  Neurological:     Mental Status: She is alert and oriented to person, place, and time.  Psychiatric:        Behavior: Behavior normal.

## 2024-01-02 ENCOUNTER — Ambulatory Visit: Admitting: Physician Assistant

## 2024-01-06 ENCOUNTER — Encounter: Payer: Self-pay | Attending: Physician Assistant | Admitting: Skilled Nursing Facility1

## 2024-01-06 ENCOUNTER — Encounter: Payer: Self-pay | Admitting: Skilled Nursing Facility1

## 2024-01-06 VITALS — Ht 67.0 in | Wt 225.3 lb

## 2024-01-06 DIAGNOSIS — E66812 Obesity, class 2: Secondary | ICD-10-CM | POA: Insufficient documentation

## 2024-01-06 NOTE — Progress Notes (Signed)
 Medical Nutrition Therapy  Appointment Start time:  4:22  Appointment End time:  5:10  Primary concerns today: gluten free diet  Referral diagnosis: e63.9   NUTRITION ASSESSMENT    Clinical Medical Hx: anxiety, depression Medications: see list Labs: B12 115 when deficient but since doing injections 653, vitamin D deficient 13.3 and since supplementation 101   Notable Signs/Symptoms: chronic diarrhea: about 1 year ongoing 4 times a day lose stools uncontrolled with fecal incontinence; dark floaters in vision, chronic fatigue, bloating, gas, past headaches (stopped since reducing gluten)  Lifestyle & Dietary Hx  Pt states she lost 40 pounds due to geographic tongue so has been trying gluten free. Pt states she was having headaches every day for months but since cutting out gluten she no longer has headaches. Pt states since stopping the toothpaste she no longer has tongue pain.  Pt states she was having low iron , vitamin D deficiency, and B12 deficiency.   Pt staets she lives with her boyfriend and his mom and his child. Pt states she is trying to cook more but struggles with this due to being so tired when she comes home.   Due to pts bloat, anemia, diarrhea, other deficiencies, symptoms bettered by reduced gluten suspect Celiac: advised pt to ask her doctor to test for Celiac: advised pt she cannot be on a gluten free diet in order for the test to be conclusive.   Estimated daily fluid intake: about 40 oz Supplements:  Sleep:  Stress / self-care: high due to diarrhea  Current average weekly physical activity: ADL's  24-Hr Dietary Recall First Meal: cheese Snack:  Second Meal: salad  Snack:  Third Meal: gluten free pasta  Snack:  Beverages: 1-2 applejuice, water, 1-2 regular cans soda   NUTRITION INTERVENTION  Nutrition education (E-1) on the following topics:  Educated pt on the GI tract and absorption Importance of adequate balanced meal intake within the context of  chronic diarrhea Importance of adequate hydration within the context of chronic diarrhea  Alcohol effects on the gut and metabolizing B vitamins Possible underlying conditions causing chronic diarrhea and deficiencies such as celiac disease   Learning Style & Readiness for Change Teaching method utilized: Visual & Auditory  Demonstrated degree of understanding via: Teach Back  Barriers to learning/adherence to lifestyle change: anxiety  Goals Established by Pt Half and half applejuice in 2 stanleys (totaling 80 ounces) Start taking B1 take 5  Ask doctor for Celiac test, be sure to tell them about your chronic daily diarrhea    MONITORING & EVALUATION Dietary intake, weekly physical activity  Next Steps  Patient is to call to make a follow up appt after her celiac panel.

## 2024-02-24 ENCOUNTER — Ambulatory Visit: Admitting: Physician Assistant

## 2024-02-24 ENCOUNTER — Encounter: Payer: Self-pay | Admitting: Physician Assistant

## 2024-02-24 DIAGNOSIS — F988 Other specified behavioral and emotional disorders with onset usually occurring in childhood and adolescence: Secondary | ICD-10-CM

## 2024-02-24 DIAGNOSIS — F4323 Adjustment disorder with mixed anxiety and depressed mood: Secondary | ICD-10-CM | POA: Diagnosis not present

## 2024-02-24 MED ORDER — AMPHETAMINE-DEXTROAMPHETAMINE 15 MG PO TABS: 7.5000 mg | ORAL_TABLET | Freq: Two times a day (BID) | ORAL | 0 refills | Status: AC

## 2024-02-24 MED ORDER — BUPROPION HCL ER (SR) 150 MG PO TB12: 150.0000 mg | ORAL_TABLET | Freq: Two times a day (BID) | ORAL | 5 refills | Status: AC

## 2024-02-24 NOTE — Progress Notes (Signed)
 "     [facility]  Patient ID: Tara Sullivan,  MRN: [id number]  PCP: Allen Lauraine CROME, PA-C  Date of Evaluation: 02/24/2024 Time spent:20 minutes  Chief Complaint:  Chief Complaint   ADD; Anxiety; Depression; Follow-up    Virtual Visit via Telehealth  I connected with patient by telephone, with their informed consent, and verified patient privacy and that I am speaking with the correct person using two identifiers.  I am private, in my office and the patient is at home.  I discussed the limitations, risks, security and privacy concerns of performing an evaluation and management service by telephone and the availability of in person appointments. I also discussed with the patient that there may be a patient responsible charge related to this service. The patient expressed understanding and agreed to proceed.   I discussed the assessment and treatment plan with the patient. The patient was provided an opportunity to ask questions and all were answered. The patient agreed with the plan and demonstrated an understanding of the instructions.   The patient was advised to call back or seek an in-person evaluation if the symptoms worsen or if the condition fails to improve as anticipated.  I provided approximately 25 minutes of non-face-to-face time during this encounter.  HISTORY/CURRENT STATUS: HPI For routine med check.     She stopped the wellbutrin  in July/August, wasn't sure if she needed it or not.   Lately has felt she should be taking it so restarted it.  Hard to say yet how beneficial it is. She and boyfriend are having probs and that's a part of her decline in mood.  Energy and motivation have been low.  Work is going ok, still stressful.   No extreme sadness, tearfulness, or feelings of hopelessness.  Sleeps ok.  ADLs and personal hygiene are normal.  Adderall helps. States that attention is good without easy distractibility.  Able to focus on things and finish tasks.   Anxiety is controlled.   No mania, delirium, AH/VH.  No SI/HI.  Individual Medical History/ Review of Systems: Changes? :Yes   Is on B 12 weekly, lost 50# b/c dec appetite. Celiac testing pending.  Has geographic tongue for months.   Past medications for mental health diagnoses include: Zoloft , Lexapro, Seroquel , lithium , Latuda  caused stomach pain, Wellbutrin , BuSpar  ineffective, Pristiq , Klonopin , Prazosin  x 1 night, then no longer needed, Strattera , Trileptal  caused nausea, Lamictal  didn't help. Luvox , questional efficacy  Allergies: Latex and Trileptal  [oxcarbazepine ]  Current Medications:  Current Outpatient Medications:    amphetamine -dextroamphetamine  (ADDERALL) 15 MG tablet, Take 0.5-1 tablets by mouth 2 (two) times daily., Disp: 60 tablet, Rfl: 0   Cholecalciferol  (VITAMIN D3) 1.25 MG (50000 UT) CAPS, Take 1 capsule (50,000 Units total) by mouth 2 (two) times a week., Disp: 24 capsule, Rfl: 0   cyanocobalamin  (VITAMIN B12) 1000 MCG tablet, Take one tablet (1,000 mcg dose) by mouth daily., Disp: 90 tablet, Rfl: 1   cyanocobalamin  (VITAMIN B12) 1000 MCG/ML injection, Inject 1,000 mcg into the muscle., Disp: , Rfl:    etonogestrel -ethinyl estradiol  (NUVARING) 0.12-0.015 MG/24HR vaginal ring, Insert 1 ring vaginally every 4 weeks, skipping placebo week (use continuously), Disp: 3 each, Rfl: 4   ALPRAZolam  (XANAX ) 1 MG tablet, TAKE 1/2 TO 1 (ONE-HALF TO ONE) TABLET BY MOUTH TWICE DAILY AS NEEDED FOR ANXIETY (Patient not taking: Reported on 02/24/2024), Disp: 60 tablet, Rfl: 0   [START ON 03/24/2024] amphetamine -dextroamphetamine  (ADDERALL) 15 MG tablet, Take 0.5-1 tablets by mouth 2 (two) times  daily., Disp: 60 tablet, Rfl: 0   [START ON 04/22/2024] amphetamine -dextroamphetamine  (ADDERALL) 15 MG tablet, Take 0.5-1 tablets by mouth 2 (two) times daily., Disp: 60 tablet, Rfl: 0   buPROPion  (WELLBUTRIN  SR) 150 MG 12 hr tablet, Take 1 tablet (150 mg total) by mouth 2 (two) times daily., Disp: 60  tablet, Rfl: 5   ferrous gluconate  (FERGON) 324 MG tablet, Take 1 tablet (324 mg total) by mouth daily with breakfast. (Patient not taking: Reported on 02/24/2024), Disp: 90 tablet, Rfl: 1 Medication Side Effects: Decreased libido but not sure if only med related.    Family Medical/ Social History: Changes?  no  MENTAL HEALTH EXAM:  There were no vitals taken for this visit.There is no height or weight on file to calculate BMI.  General Appearance: unable to assess  Eye Contact:  unable to assess  Speech:  Clear and Coherent and Normal Rate  Volume:  Normal  Mood:  Euthymic  Affect:  unable to assess  Thought Process:  Goal Directed and Descriptions of Associations: Intact  Orientation:  Full (Time, Place, and Person)  Thought Content: Logical   Suicidal Thoughts:  No  Homicidal Thoughts:  No  Memory:  WNL  Judgement:  Good  Insight:  Good  Psychomotor Activity:  unable to assess  Concentration:  Concentration: Good and Attention Span: Fair  Recall:  Good  Fund of Knowledge: Good  Language: Good  Assets:  Communication Skills Desire for Improvement Financial Resources/Insurance Housing Resilience Social Support Transportation Vocational/Educational  ADL's:  Intact  Cognition: WNL  Prognosis:  Good   DIAGNOSES:    ICD-10-CM   1. Situational mixed anxiety and depressive disorder  F43.23     2. 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   RECOMMENDATIONS:  PDMP was reviewed.  Adderall filled 01/20/2024.  Gabapentin filled 12/13/2023.  Xanax  filled 08/03/2023. I provided approximately 25 minutes of face to non-face time during this encounter, including time spent before and after the visit in records review, medical decision making, counseling pertinent to today's visit,  and charting.   She hasn't been back on the Wellbutrin  long enough to know how beneficial it is.  I recommend staying on this dose for another month or so, re-eval then.   Continue Adderall 15 mg, 1/2-1 po bid.  Continue Wellburin SR 150 mg, 1 bid.  Recommend therapy. Return in 6 weeks.    Verneita Cooks, PA-C  "

## 2024-02-29 ENCOUNTER — Encounter: Payer: Self-pay | Admitting: Physician Assistant
